# Patient Record
Sex: Female | Born: 1950
Health system: Southern US, Community
[De-identification: ages and names within clinical notes are randomized; demographics above are authoritative.]

## PROBLEM LIST (undated history)

## (undated) DIAGNOSIS — M1990 Unspecified osteoarthritis, unspecified site: Secondary | ICD-10-CM

## (undated) DIAGNOSIS — R634 Abnormal weight loss: Secondary | ICD-10-CM

## (undated) DIAGNOSIS — E785 Hyperlipidemia, unspecified: Secondary | ICD-10-CM

## (undated) DIAGNOSIS — M797 Fibromyalgia: Secondary | ICD-10-CM

## (undated) DIAGNOSIS — K219 Gastro-esophageal reflux disease without esophagitis: Secondary | ICD-10-CM

## (undated) DIAGNOSIS — G44029 Chronic cluster headache, not intractable: Secondary | ICD-10-CM

## (undated) DIAGNOSIS — IMO0001 Reserved for inherently not codable concepts without codable children: Secondary | ICD-10-CM

## (undated) DIAGNOSIS — G629 Polyneuropathy, unspecified: Secondary | ICD-10-CM

## (undated) DIAGNOSIS — I499 Cardiac arrhythmia, unspecified: Secondary | ICD-10-CM

## (undated) DIAGNOSIS — C801 Malignant (primary) neoplasm, unspecified: Secondary | ICD-10-CM

## (undated) DIAGNOSIS — R0600 Dyspnea, unspecified: Secondary | ICD-10-CM

## (undated) DIAGNOSIS — F32A Depression, unspecified: Secondary | ICD-10-CM

## (undated) DIAGNOSIS — F419 Anxiety disorder, unspecified: Secondary | ICD-10-CM

## (undated) DIAGNOSIS — I10 Essential (primary) hypertension: Secondary | ICD-10-CM

## (undated) DIAGNOSIS — Z8719 Personal history of other diseases of the digestive system: Secondary | ICD-10-CM

## (undated) DIAGNOSIS — F329 Major depressive disorder, single episode, unspecified: Secondary | ICD-10-CM

## (undated) DIAGNOSIS — Q8909 Congenital malformations of spleen: Secondary | ICD-10-CM

## (undated) DIAGNOSIS — G4730 Sleep apnea, unspecified: Secondary | ICD-10-CM

## (undated) HISTORY — DX: Hyperlipidemia, unspecified: E78.5

## (undated) HISTORY — PX: BACK SURGERY: SHX140

## (undated) HISTORY — PX: ABDOMINAL HYSTERECTOMY: SHX81

## (undated) HISTORY — DX: Reserved for inherently not codable concepts without codable children: IMO0001

## (undated) HISTORY — DX: Fibromyalgia: M79.7

## (undated) HISTORY — DX: Abnormal weight loss: R63.4

## (undated) HISTORY — PX: CHOLECYSTECTOMY: SHX55

## (undated) HISTORY — PX: BREAST BIOPSY: SHX20

## (undated) HISTORY — PX: NOSE SURGERY: SHX723

## (undated) HISTORY — PX: BREAST LUMPECTOMY: SHX2

## (undated) HISTORY — PX: ROTATOR CUFF REPAIR: SHX139

## (undated) HISTORY — PX: CARDIAC CATHETERIZATION: SHX172

## (undated) HISTORY — DX: Gastro-esophageal reflux disease without esophagitis: K21.9

## (undated) HISTORY — DX: Congenital malformations of spleen: Q89.09

---

## 1980-09-13 HISTORY — PX: BREAST EXCISIONAL BIOPSY: SUR124

## 1999-12-16 ENCOUNTER — Encounter: Admission: RE | Admit: 1999-12-16 | Discharge: 1999-12-16 | Payer: Self-pay | Admitting: Internal Medicine

## 1999-12-24 ENCOUNTER — Encounter: Admission: RE | Admit: 1999-12-24 | Discharge: 1999-12-24 | Payer: Self-pay | Admitting: Internal Medicine

## 2000-02-05 ENCOUNTER — Ambulatory Visit (HOSPITAL_COMMUNITY): Admission: RE | Admit: 2000-02-05 | Discharge: 2000-02-05 | Payer: Self-pay | Admitting: Hematology and Oncology

## 2000-02-05 ENCOUNTER — Encounter: Admission: RE | Admit: 2000-02-05 | Discharge: 2000-02-05 | Payer: Self-pay | Admitting: Hematology and Oncology

## 2000-02-09 ENCOUNTER — Ambulatory Visit (HOSPITAL_COMMUNITY): Admission: RE | Admit: 2000-02-09 | Discharge: 2000-02-09 | Payer: Self-pay | Admitting: Internal Medicine

## 2000-03-14 ENCOUNTER — Encounter: Admission: RE | Admit: 2000-03-14 | Discharge: 2000-03-14 | Payer: Self-pay

## 2000-05-05 ENCOUNTER — Encounter: Admission: RE | Admit: 2000-05-05 | Discharge: 2000-05-05 | Payer: Self-pay | Admitting: Hematology and Oncology

## 2000-05-08 ENCOUNTER — Ambulatory Visit (HOSPITAL_COMMUNITY): Admission: RE | Admit: 2000-05-08 | Discharge: 2000-05-08 | Payer: Self-pay | Admitting: Internal Medicine

## 2000-05-08 ENCOUNTER — Encounter: Payer: Self-pay | Admitting: Internal Medicine

## 2000-05-26 ENCOUNTER — Encounter: Admission: RE | Admit: 2000-05-26 | Discharge: 2000-05-26 | Payer: Self-pay | Admitting: Internal Medicine

## 2000-08-18 ENCOUNTER — Encounter: Admission: RE | Admit: 2000-08-18 | Discharge: 2000-08-18 | Payer: Self-pay | Admitting: Internal Medicine

## 2000-10-11 ENCOUNTER — Encounter: Admission: RE | Admit: 2000-10-11 | Discharge: 2000-10-11 | Payer: Self-pay | Admitting: Obstetrics & Gynecology

## 2000-11-11 ENCOUNTER — Encounter: Admission: RE | Admit: 2000-11-11 | Discharge: 2000-11-11 | Payer: Self-pay | Admitting: Internal Medicine

## 2000-11-14 ENCOUNTER — Encounter: Payer: Self-pay | Admitting: Hematology and Oncology

## 2000-11-14 ENCOUNTER — Ambulatory Visit (HOSPITAL_COMMUNITY): Admission: RE | Admit: 2000-11-14 | Discharge: 2000-11-14 | Payer: Self-pay | Admitting: Hematology and Oncology

## 2000-12-28 ENCOUNTER — Encounter: Admission: RE | Admit: 2000-12-28 | Discharge: 2000-12-28 | Payer: Self-pay | Admitting: Hematology and Oncology

## 2001-01-16 ENCOUNTER — Encounter: Admission: RE | Admit: 2001-01-16 | Discharge: 2001-01-16 | Payer: Self-pay | Admitting: Internal Medicine

## 2001-01-27 ENCOUNTER — Encounter: Admission: RE | Admit: 2001-01-27 | Discharge: 2001-01-27 | Payer: Self-pay | Admitting: Internal Medicine

## 2001-02-13 ENCOUNTER — Ambulatory Visit (HOSPITAL_COMMUNITY): Admission: RE | Admit: 2001-02-13 | Discharge: 2001-02-13 | Payer: Self-pay | Admitting: Internal Medicine

## 2001-02-13 ENCOUNTER — Encounter: Admission: RE | Admit: 2001-02-13 | Discharge: 2001-02-13 | Payer: Self-pay | Admitting: Internal Medicine

## 2001-02-16 ENCOUNTER — Encounter: Admission: RE | Admit: 2001-02-16 | Discharge: 2001-02-16 | Payer: Self-pay | Admitting: Internal Medicine

## 2001-02-21 ENCOUNTER — Encounter: Payer: Self-pay | Admitting: Internal Medicine

## 2001-02-21 ENCOUNTER — Ambulatory Visit (HOSPITAL_COMMUNITY): Admission: RE | Admit: 2001-02-21 | Discharge: 2001-02-21 | Payer: Self-pay | Admitting: Internal Medicine

## 2001-03-22 ENCOUNTER — Encounter: Admission: RE | Admit: 2001-03-22 | Discharge: 2001-03-22 | Payer: Self-pay | Admitting: Internal Medicine

## 2001-03-30 ENCOUNTER — Encounter: Admission: RE | Admit: 2001-03-30 | Discharge: 2001-03-30 | Payer: Self-pay | Admitting: Obstetrics

## 2001-03-30 ENCOUNTER — Emergency Department (HOSPITAL_COMMUNITY): Admission: EM | Admit: 2001-03-30 | Discharge: 2001-03-30 | Payer: Self-pay | Admitting: Emergency Medicine

## 2001-04-27 ENCOUNTER — Encounter: Admission: RE | Admit: 2001-04-27 | Discharge: 2001-04-27 | Payer: Self-pay | Admitting: Obstetrics

## 2001-05-12 ENCOUNTER — Encounter: Admission: RE | Admit: 2001-05-12 | Discharge: 2001-05-12 | Payer: Self-pay | Admitting: Internal Medicine

## 2001-05-14 ENCOUNTER — Ambulatory Visit (HOSPITAL_COMMUNITY): Admission: RE | Admit: 2001-05-14 | Discharge: 2001-05-14 | Payer: Self-pay | Admitting: Internal Medicine

## 2001-05-14 ENCOUNTER — Encounter: Payer: Self-pay | Admitting: Internal Medicine

## 2001-06-07 ENCOUNTER — Ambulatory Visit (HOSPITAL_COMMUNITY): Admission: RE | Admit: 2001-06-07 | Discharge: 2001-06-07 | Payer: Self-pay | Admitting: Internal Medicine

## 2001-06-07 ENCOUNTER — Encounter: Admission: RE | Admit: 2001-06-07 | Discharge: 2001-06-07 | Payer: Self-pay | Admitting: Internal Medicine

## 2001-06-12 ENCOUNTER — Encounter: Admission: RE | Admit: 2001-06-12 | Discharge: 2001-06-12 | Payer: Self-pay | Admitting: Internal Medicine

## 2001-09-13 HISTORY — PX: CARPAL TUNNEL RELEASE: SHX101

## 2008-11-29 ENCOUNTER — Ambulatory Visit (HOSPITAL_COMMUNITY): Admission: RE | Admit: 2008-11-29 | Discharge: 2008-11-29 | Payer: Self-pay | Admitting: Family Medicine

## 2008-12-02 ENCOUNTER — Ambulatory Visit (HOSPITAL_COMMUNITY): Admission: RE | Admit: 2008-12-02 | Discharge: 2008-12-02 | Payer: Self-pay | Admitting: Family Medicine

## 2009-08-25 ENCOUNTER — Ambulatory Visit (HOSPITAL_COMMUNITY): Admission: RE | Admit: 2009-08-25 | Discharge: 2009-08-25 | Payer: Self-pay | Admitting: Cardiovascular Disease

## 2010-02-10 ENCOUNTER — Ambulatory Visit (HOSPITAL_COMMUNITY): Admission: RE | Admit: 2010-02-10 | Discharge: 2010-02-10 | Payer: Self-pay | Admitting: Unknown Physician Specialty

## 2010-04-01 ENCOUNTER — Ambulatory Visit (HOSPITAL_COMMUNITY): Admission: RE | Admit: 2010-04-01 | Discharge: 2010-04-01 | Payer: Self-pay | Admitting: Internal Medicine

## 2010-04-16 ENCOUNTER — Ambulatory Visit: Payer: Self-pay | Admitting: Otolaryngology

## 2010-10-22 ENCOUNTER — Ambulatory Visit (INDEPENDENT_AMBULATORY_CARE_PROVIDER_SITE_OTHER): Payer: Self-pay | Admitting: Otolaryngology

## 2010-12-15 LAB — GLUCOSE, CAPILLARY
Glucose-Capillary: 107 mg/dL — ABNORMAL HIGH (ref 70–99)
Glucose-Capillary: 82 mg/dL (ref 70–99)

## 2011-02-18 ENCOUNTER — Other Ambulatory Visit (HOSPITAL_COMMUNITY): Payer: Self-pay | Admitting: Internal Medicine

## 2011-02-18 DIAGNOSIS — Z139 Encounter for screening, unspecified: Secondary | ICD-10-CM

## 2011-02-23 ENCOUNTER — Ambulatory Visit (HOSPITAL_COMMUNITY)
Admission: RE | Admit: 2011-02-23 | Discharge: 2011-02-23 | Disposition: A | Discharge status: Home / Self Care | Payer: Medicare Other | Source: Ambulatory Visit | Attending: Internal Medicine | Admitting: Internal Medicine

## 2011-02-23 DIAGNOSIS — Z1231 Encounter for screening mammogram for malignant neoplasm of breast: Secondary | ICD-10-CM | POA: Insufficient documentation

## 2011-02-23 DIAGNOSIS — Z139 Encounter for screening, unspecified: Secondary | ICD-10-CM

## 2011-02-25 DIAGNOSIS — R634 Abnormal weight loss: Secondary | ICD-10-CM

## 2011-02-25 HISTORY — DX: Abnormal weight loss: R63.4

## 2011-03-04 LAB — HEMOGLOBIN A1C: Hgb A1c MFr Bld: 5.9 % (ref 4.0–6.0)

## 2011-03-31 ENCOUNTER — Encounter (INDEPENDENT_AMBULATORY_CARE_PROVIDER_SITE_OTHER): Payer: Self-pay

## 2011-04-29 ENCOUNTER — Ambulatory Visit (INDEPENDENT_AMBULATORY_CARE_PROVIDER_SITE_OTHER): Payer: BC Managed Care – PPO | Admitting: Internal Medicine

## 2011-05-25 ENCOUNTER — Other Ambulatory Visit (HOSPITAL_COMMUNITY): Payer: Self-pay | Admitting: Internal Medicine

## 2011-05-25 DIAGNOSIS — M159 Polyosteoarthritis, unspecified: Secondary | ICD-10-CM

## 2011-05-28 ENCOUNTER — Ambulatory Visit (HOSPITAL_COMMUNITY)
Admission: RE | Admit: 2011-05-28 | Discharge: 2011-05-28 | Disposition: A | Discharge status: Home / Self Care | Payer: Medicare Other | Source: Ambulatory Visit | Attending: Internal Medicine | Admitting: Internal Medicine

## 2011-05-28 ENCOUNTER — Other Ambulatory Visit (HOSPITAL_COMMUNITY): Payer: Self-pay | Admitting: Internal Medicine

## 2011-05-28 DIAGNOSIS — M159 Polyosteoarthritis, unspecified: Secondary | ICD-10-CM | POA: Insufficient documentation

## 2011-05-31 ENCOUNTER — Encounter (HOSPITAL_COMMUNITY): Payer: Medicare Other

## 2011-06-01 ENCOUNTER — Encounter (HOSPITAL_COMMUNITY): Payer: Self-pay

## 2011-06-01 ENCOUNTER — Encounter (HOSPITAL_COMMUNITY)
Admission: RE | Admit: 2011-06-01 | Discharge: 2011-06-01 | Disposition: A | Discharge status: Home / Self Care | Payer: Medicare Other | Source: Ambulatory Visit | Attending: Internal Medicine | Admitting: Internal Medicine

## 2011-06-01 DIAGNOSIS — M25559 Pain in unspecified hip: Secondary | ICD-10-CM | POA: Insufficient documentation

## 2011-06-01 DIAGNOSIS — M545 Low back pain, unspecified: Secondary | ICD-10-CM | POA: Insufficient documentation

## 2011-06-01 DIAGNOSIS — R748 Abnormal levels of other serum enzymes: Secondary | ICD-10-CM | POA: Insufficient documentation

## 2011-06-01 HISTORY — DX: Essential (primary) hypertension: I10

## 2011-06-01 MED ORDER — TECHNETIUM TC 99M MEDRONATE IV KIT
25.0000 | PACK | Freq: Once | INTRAVENOUS | Status: AC | PRN
  Administered 2011-06-01: 24.6 via INTRAVENOUS

## 2011-10-06 ENCOUNTER — Other Ambulatory Visit (HOSPITAL_COMMUNITY): Payer: Self-pay | Admitting: Internal Medicine

## 2011-10-06 DIAGNOSIS — IMO0002 Reserved for concepts with insufficient information to code with codable children: Secondary | ICD-10-CM

## 2011-10-08 ENCOUNTER — Ambulatory Visit (HOSPITAL_COMMUNITY): Payer: Medicare Other

## 2011-10-12 ENCOUNTER — Ambulatory Visit (HOSPITAL_COMMUNITY)
Admission: RE | Admit: 2011-10-12 | Discharge: 2011-10-12 | Disposition: A | Discharge status: Home / Self Care | Payer: Medicare Other | Source: Ambulatory Visit | Attending: Internal Medicine | Admitting: Internal Medicine

## 2011-10-12 DIAGNOSIS — IMO0002 Reserved for concepts with insufficient information to code with codable children: Secondary | ICD-10-CM

## 2011-10-12 DIAGNOSIS — M5380 Other specified dorsopathies, site unspecified: Secondary | ICD-10-CM | POA: Insufficient documentation

## 2011-10-12 DIAGNOSIS — M5020 Other cervical disc displacement, unspecified cervical region: Secondary | ICD-10-CM | POA: Insufficient documentation

## 2011-10-12 DIAGNOSIS — M542 Cervicalgia: Secondary | ICD-10-CM | POA: Insufficient documentation

## 2011-10-12 DIAGNOSIS — M79609 Pain in unspecified limb: Secondary | ICD-10-CM | POA: Insufficient documentation

## 2012-04-05 ENCOUNTER — Other Ambulatory Visit (HOSPITAL_COMMUNITY): Payer: Self-pay | Admitting: Unknown Physician Specialty

## 2012-04-05 DIAGNOSIS — Z139 Encounter for screening, unspecified: Secondary | ICD-10-CM

## 2012-04-06 ENCOUNTER — Ambulatory Visit (HOSPITAL_COMMUNITY)
Admission: RE | Admit: 2012-04-06 | Discharge: 2012-04-06 | Disposition: A | Discharge status: Home / Self Care | Payer: Medicare Other | Source: Ambulatory Visit | Attending: Unknown Physician Specialty | Admitting: Unknown Physician Specialty

## 2012-04-06 DIAGNOSIS — Z1231 Encounter for screening mammogram for malignant neoplasm of breast: Secondary | ICD-10-CM | POA: Insufficient documentation

## 2012-04-06 DIAGNOSIS — Z139 Encounter for screening, unspecified: Secondary | ICD-10-CM

## 2012-05-18 ENCOUNTER — Encounter: Payer: Self-pay | Admitting: Gastroenterology

## 2012-05-18 ENCOUNTER — Ambulatory Visit (INDEPENDENT_AMBULATORY_CARE_PROVIDER_SITE_OTHER): Payer: Medicare Other | Admitting: Gastroenterology

## 2012-05-18 VITALS — BP 141/87 | HR 69 | Temp 97.5°F | Ht 65.0 in | Wt 173.4 lb

## 2012-05-18 DIAGNOSIS — Z800 Family history of malignant neoplasm of digestive organs: Secondary | ICD-10-CM

## 2012-05-18 DIAGNOSIS — R634 Abnormal weight loss: Secondary | ICD-10-CM

## 2012-05-18 DIAGNOSIS — K219 Gastro-esophageal reflux disease without esophagitis: Secondary | ICD-10-CM

## 2012-05-18 DIAGNOSIS — R109 Unspecified abdominal pain: Secondary | ICD-10-CM

## 2012-05-18 DIAGNOSIS — R1310 Dysphagia, unspecified: Secondary | ICD-10-CM

## 2012-05-18 MED ORDER — PEG-KCL-NACL-NASULF-NA ASC-C 100 G PO SOLR: 1.0000 | ORAL | Status: DC

## 2012-05-18 NOTE — Progress Notes (Signed)
Referring Provider: Fusco, Lawrence J., MD Primary Care Physician:  FUSCO,LAWRENCE J., MD Primary Gastroenterologist:  Dr. Rourk  Chief Complaint  Patient presents with  . Colonoscopy    HPI:   61-year-old female presenting today for colonoscopy due to +FH of colon cancer in first-degree relative (father, unclear age of diagnosis). 2 maternal aunts, maternal uncle with colon cancer.  Mother died at age 40 of uterine/ovarian cancer. Pt was 61 years old. Last colonoscopy 2007 at [facility] per pt; however, they have no record of this. Pt does not believe she had polyps. Also notes EGD around that time with some type of growth in her stomach.   Notes chronic abdominal pain. States LUQ pain after eating. RLQ pain intermittent without aggravating factors. HOWEVER, sometimes relieved after bout of diarrhea. Notes since chole +diarrhea. Denies N/V. Still has ovaries, sees GYN end of month. Denies rectal bleeding.   +wt loss of almost 20 lbs over past 6 months, unintentional. +early satiety and bloating. Appetite waxes and wanes. +esophageal dysphagia. Nexium BID, in donut hole. Unable to get meds.   Tried Prilosec, Prevacid, Protonix. +nocturnal reflux, sometimes some drained out of mouth in middle of night.   Past Medical History  Diagnosis Date  . GERD (gastroesophageal reflux disease)   . Weight loss 02/25/2011  . Diabetes mellitus   . Hypertension   . Asthma   . Fibromyalgia     Past Surgical History  Procedure Date  . Abdominal hysterectomy     ovaries remain, done because of endometriosis  . Back surgery     lower back  . Rotator cuff repair     left  . Cholecystectomy   . Nose surgery   . Breast lumpectomy     both breast    Current Outpatient Prescriptions  Medication Sig Dispense Refill  . ALPRAZolam (XANAX) 1 MG tablet Take 1 mg by mouth.        . carisoprodol (SOMA) 350 MG tablet Take 350 mg by mouth 3 (three) times daily as needed.      . cyclobenzaprine  (FLEXERIL) 10 MG tablet Take 10 mg by mouth 3 (three) times daily as needed.      . enalapril (VASOTEC) 2.5 MG tablet Take 2.5 mg by mouth daily.        . etodolac (LODINE) 500 MG tablet Take 500 mg by mouth 2 (two) times daily.      . FLUoxetine (PROZAC) 20 MG capsule Take 40 mg by mouth daily.       . gabapentin (NEURONTIN) 600 MG tablet Take 600 mg by mouth 3 (three) times daily.        . metoprolol (TOPROL-XL) 50 MG 24 hr tablet Take 50 mg by mouth 2 (two) times daily.       . traMADol (ULTRAM) 50 MG tablet Take 50 mg by mouth 3 (three) times daily. 2 pills x 3 days if needed      . albuterol (PROVENTIL) (2.5 MG/3ML) 0.083% nebulizer solution Take 2.5 mg by nebulization.        . aspirin 81 MG tablet Take 81 mg by mouth daily.        . ciclesonide (ALVESCO) 160 MCG/ACT inhaler Inhale 1 puff into the lungs 2 (two) times daily.        . enalapril (VASOTEC) 5 MG tablet Take 5 mg by mouth daily.        . esomeprazole (NEXIUM) 40 MG capsule Take 40 mg by mouth daily before breakfast.       .   mometasone (NASONEX) 50 MCG/ACT nasal spray Place 2 sprays into the nose daily.        . peg 3350 powder (MOVIPREP) 100 G SOLR Take 1 kit (100 g total) by mouth as directed.  1 kit  0    Allergies as of 05/18/2012 - Review Complete 05/18/2012  Allergen Reaction Noted  . Clarithromycin  03/31/2011  . Codeine Itching 03/31/2011  . Darvocet (propoxyphene-acetaminophen) Itching 03/31/2011  . Sulfa antibiotics Hives 03/31/2011  . Tape Itching 06/01/2011    Family History  Problem Relation Age of Onset  . Colon cancer Father   . Colon cancer Maternal Aunt   . Colon cancer Maternal Uncle     History   Social History  . Marital Status: Divorced    Spouse Name: N/A    Number of Children: N/A  . Years of Education: N/A   Occupational History  . Not on file.   Social History Main Topics  . Smoking status: Former Smoker -- 0.5 packs/day    Types: Cigarettes  . Smokeless tobacco: Not on file    Comment: quit about 25 + years ago  . Alcohol Use: No  . Drug Use: No  . Sexually Active: Not on file   Other Topics Concern  . Not on file   Social History Narrative  . No narrative on file    Review of Systems: Gen: SEE HPI CV: Denies chest pain, heart palpitations, syncope, peripheral edema. Resp: Denies shortness of breath with rest, cough, wheezing GI: SEE HPI GU : Denies urinary burning, urinary frequency, urinary incontinence.  MS: Denies joint pain, muscle weakness, cramps, limited movement Derm: Denies rash, itching, dry skin Psych: Denies depression, anxiety, confusion or memory loss  Heme: Denies bruising, bleeding, and enlarged lymph nodes.  Physical Exam: BP 141/87  Pulse 69  Temp 97.5 F (36.4 C) (Temporal)  Ht 5' 5" (1.651 m)  Wt 173 lb 6.4 oz (78.654 kg)  BMI 28.86 kg/m2 General:   Alert and oriented. Well-developed, well-nourished, pleasant and cooperative. Head:  Normocephalic and atraumatic. Eyes:  Conjunctiva pink, sclera clear, no icterus.   Conjunctiva pink. Ears:  Normal auditory acuity. Nose:  No deformity, discharge,  or lesions. Mouth:  No deformity or lesions, mucosa pink and moist.  Neck:  Supple, without mass or thyromegaly. Lungs:  Clear to auscultation bilaterally, without wheezing, rales, or rhonchi.  Heart:  S1, S2 present without murmurs noted.  Abdomen:  +BS, soft, TTP epigastric, LUQ, RLQ and non-distended. Without mass or HSM. No rebound or guarding. No hernias noted. Rectal:  Deferred  Msk:  Symmetrical without gross deformities. Normal posture. Extremities:  Without clubbing or edema. Neurologic:  Alert and  oriented x4;  grossly normal neurologically. Skin:  Intact, warm and dry without significant lesions or rashes Cervical Nodes:  No significant cervical adenopathy. Psych:  Alert and cooperative. Normal mood and affect.   

## 2012-05-18 NOTE — Patient Instructions (Addendum)
We have set you up for a colonoscopy and upper endoscopy with Dr. Darrick Penna in the near future.  Further recommendations to follow once this is completed.

## 2012-05-23 DIAGNOSIS — R634 Abnormal weight loss: Secondary | ICD-10-CM | POA: Insufficient documentation

## 2012-05-23 DIAGNOSIS — R109 Unspecified abdominal pain: Secondary | ICD-10-CM | POA: Insufficient documentation

## 2012-05-23 DIAGNOSIS — R1310 Dysphagia, unspecified: Secondary | ICD-10-CM | POA: Insufficient documentation

## 2012-05-23 DIAGNOSIS — Z800 Family history of malignant neoplasm of digestive organs: Secondary | ICD-10-CM | POA: Insufficient documentation

## 2012-05-23 DIAGNOSIS — K219 Gastro-esophageal reflux disease without esophagitis: Secondary | ICD-10-CM | POA: Insufficient documentation

## 2012-05-23 NOTE — Assessment & Plan Note (Signed)
Unintentional, early satiety. TCS and EGD as planned.

## 2012-05-23 NOTE — Assessment & Plan Note (Signed)
Severe. Tried/failed multiple PPIs in past. Continue Nexium, consider BID dosing. Needs wt loss, dietary efforts. EGD as planned.

## 2012-05-23 NOTE — Assessment & Plan Note (Signed)
LUQ pain after eating, chronic, +early satiety. Reports possible hx of some type of growth in stomach. Unable to obtain EGD reports from [facility], as medical records state none exist. Does report remote hx of EGD, around 2007 at time of last TCS. No melena noted. Needs EGD due to dyspepsia, unintentional wt loss. Small bowel biopsy at time of EGD due to bloating, pain, loose stools, r/o celiac. Also notes esophageal dysphagia. Question r/t uncontrolled reflux and/or Schatzki's ring, web, doubt stricture.  Proceed with upper endoscopy, dilation, small bowel biopsy in the near future with Dr. Jena Gauss. The risks, benefits, and alternatives have been discussed in detail with patient. They have stated understanding and desire to proceed.  Continue Nexium daily (has tried/failed multiple PPIs in past, see HPI)

## 2012-05-23 NOTE — Assessment & Plan Note (Signed)
Dilation scheduled.

## 2012-05-23 NOTE — Progress Notes (Signed)
Faxed to PCP

## 2012-05-23 NOTE — Assessment & Plan Note (Signed)
61 year old female with +FH colon cancer in first-degree relative (father, age unknown at onset). Last TCS reportedly in 2007 in [place]; however, no records exist for this despite our request. No rectal bleeding noted. +RLQ pain, sometimes relieved after BM, notes intermittent chronic loose stools following cholecystectomy. +bloating. Sees GYN end of month (?RLQ with IBS component vs GYN issue? Pt notes feels like hx of ovarian cyst at times)  Proceed with TCS with Dr. Jena Gauss in near future: the risks, benefits, and alternatives have been discussed with the patient in detail. The patient states understanding and desires to proceed.

## 2012-05-25 ENCOUNTER — Ambulatory Visit (INDEPENDENT_AMBULATORY_CARE_PROVIDER_SITE_OTHER): Payer: Medicare Other | Admitting: Otolaryngology

## 2012-05-25 DIAGNOSIS — J343 Hypertrophy of nasal turbinates: Secondary | ICD-10-CM

## 2012-05-25 DIAGNOSIS — J310 Chronic rhinitis: Secondary | ICD-10-CM

## 2012-05-25 DIAGNOSIS — R070 Pain in throat: Secondary | ICD-10-CM

## 2012-06-07 ENCOUNTER — Encounter (HOSPITAL_COMMUNITY): Payer: Self-pay | Admitting: Pharmacy Technician

## 2012-06-13 HISTORY — PX: COLONOSCOPY: SHX174

## 2012-06-14 ENCOUNTER — Encounter (HOSPITAL_COMMUNITY): Payer: Self-pay | Admitting: *Deleted

## 2012-06-14 ENCOUNTER — Ambulatory Visit (HOSPITAL_COMMUNITY)
Admission: RE | Admit: 2012-06-14 | Discharge: 2012-06-14 | Disposition: A | Discharge status: Home / Self Care | Payer: Medicare Other | Source: Ambulatory Visit | Attending: Internal Medicine | Admitting: Internal Medicine

## 2012-06-14 ENCOUNTER — Encounter (HOSPITAL_COMMUNITY)
Admission: RE | Disposition: A | Discharge status: Home / Self Care | Payer: Self-pay | Source: Ambulatory Visit | Attending: Internal Medicine

## 2012-06-14 DIAGNOSIS — Z1211 Encounter for screening for malignant neoplasm of colon: Secondary | ICD-10-CM

## 2012-06-14 DIAGNOSIS — R109 Unspecified abdominal pain: Secondary | ICD-10-CM

## 2012-06-14 DIAGNOSIS — K449 Diaphragmatic hernia without obstruction or gangrene: Secondary | ICD-10-CM | POA: Insufficient documentation

## 2012-06-14 DIAGNOSIS — E119 Type 2 diabetes mellitus without complications: Secondary | ICD-10-CM | POA: Insufficient documentation

## 2012-06-14 DIAGNOSIS — R933 Abnormal findings on diagnostic imaging of other parts of digestive tract: Secondary | ICD-10-CM

## 2012-06-14 DIAGNOSIS — I10 Essential (primary) hypertension: Secondary | ICD-10-CM | POA: Insufficient documentation

## 2012-06-14 DIAGNOSIS — Z01812 Encounter for preprocedural laboratory examination: Secondary | ICD-10-CM | POA: Insufficient documentation

## 2012-06-14 DIAGNOSIS — Z800 Family history of malignant neoplasm of digestive organs: Secondary | ICD-10-CM

## 2012-06-14 DIAGNOSIS — R634 Abnormal weight loss: Secondary | ICD-10-CM

## 2012-06-14 DIAGNOSIS — R1310 Dysphagia, unspecified: Secondary | ICD-10-CM | POA: Insufficient documentation

## 2012-06-14 DIAGNOSIS — K219 Gastro-esophageal reflux disease without esophagitis: Secondary | ICD-10-CM

## 2012-06-14 HISTORY — PX: ESOPHAGEAL DILATION: SHX303

## 2012-06-14 LAB — GLUCOSE, CAPILLARY: Glucose-Capillary: 70 mg/dL (ref 70–99)

## 2012-06-14 SURGERY — COLONOSCOPY WITH ESOPHAGOGASTRODUODENOSCOPY (EGD)
Anesthesia: Moderate Sedation | Site: Mouth

## 2012-06-14 MED ORDER — MIDAZOLAM HCL 5 MG/5ML IJ SOLN
INTRAMUSCULAR | Status: AC
  Filled 2012-06-14: qty 10

## 2012-06-14 MED ORDER — SODIUM CHLORIDE 0.45 % IV SOLN
INTRAVENOUS | Status: DC
  Administered 2012-06-14: 1000 mL via INTRAVENOUS

## 2012-06-14 MED ORDER — MEPERIDINE HCL 100 MG/ML IJ SOLN
INTRAMUSCULAR | Status: AC
  Filled 2012-06-14: qty 2

## 2012-06-14 MED ORDER — MEPERIDINE HCL 100 MG/ML IJ SOLN
INTRAMUSCULAR | Status: DC | PRN
  Administered 2012-06-14 (×2): 25 mg via INTRAVENOUS
  Administered 2012-06-14 (×2): 50 mg via INTRAVENOUS

## 2012-06-14 MED ORDER — MIDAZOLAM HCL 5 MG/5ML IJ SOLN
INTRAMUSCULAR | Status: DC | PRN
  Administered 2012-06-14: 1 mg via INTRAVENOUS
  Administered 2012-06-14: 2 mg via INTRAVENOUS
  Administered 2012-06-14: 1 mg via INTRAVENOUS
  Administered 2012-06-14: 2 mg via INTRAVENOUS

## 2012-06-14 MED ORDER — STERILE WATER FOR IRRIGATION IR SOLN
Status: DC | PRN
  Administered 2012-06-14: 11:00:00

## 2012-06-14 MED ORDER — BUTAMBEN-TETRACAINE-BENZOCAINE 2-2-14 % EX AERO
INHALATION_SPRAY | CUTANEOUS | Status: DC | PRN
  Administered 2012-06-14: 2 via TOPICAL

## 2012-06-14 NOTE — Op Note (Signed)
[facility] [address] [place], [address]   ENDOSCOPY PROCEDURE REPORT  PATIENT: Pamela, Hatfield  MR#: [id number] BIRTHDATE: [birth year] , 61  yrs. old GENDER: Female ENDOSCOPIST: R.  Roetta Sessions, MD FACP FACG REFERRED BY:  [name], M.D. PROCEDURE DATE:  06/14/2012 PROCEDURE:     EGD with Elease Hashimoto dilation followed by gastric biopsy  INDICATIONS:     Refractory GERD; esophageal dysphagia.  Sketchy history of a gastric "mass" on an EGD in [place] back in 2007  INFORMED CONSENT:   The risks, benefits, limitations, alternatives and imponderables have been discussed.  The potential for biopsy, esophogeal dilation, etc. have also been reviewed.  Questions have been answered.  All parties agreeable.  Please see the history and physical in the medical record for more information.  MEDICATIONS:    Versed 4 mg IV and Demerol 100 mg IV in divided doses.  DESCRIPTION OF PROCEDURE:   The Pentax Gastroscope [id number] endoscope was introduced through the mouth and advanced to the second portion of the duodenum without difficulty or limitations. The mucosal surfaces were surveyed very carefully during advancement of the scope and upon withdrawal.  Retroflexion view of the proximal stomach and esophagogastric junction was performed.      FINDINGS: Normal, patent appearing tubular esophagus. In fact, the EG junction appeared patulous. Stomach empty. Small hiatal hernia. 8-9 mm extrinsic appearing compression versus submucosal mass in the inferior aspect of the antrum. Please see above photos. There was patchy erythema of the gastric antrum and body of uncertain significance. No ulcer or infiltrating process pylorus patent. Examination of the bulb and second portion revealed no abnormalities.  THERAPEUTIC / DIAGNOSTIC MANEUVERS PERFORMED:  A 56 French Maloney dilator was passed to full insertion easily. A look back revealed a superficial tear through the UES mucosa.  Subsequently, biopsies of gastric antrum and body were taken for histologic study.   COMPLICATIONS:  None  IMPRESSION:  Normal-appearing esophagus endoscopically-status post passage of a Maloney dilator. Small hiatal hernia. Abnormal gastric mucosa as described above-status post biopsy. Submucosal mass versus extrinsic mass effect. Will consider endoscopic ultrasound to further evaluate pending review of pathology report. See colonoscopy report.  RECOMMENDATIONS:    _______________________________ R. Roetta Sessions, MD FACP [facility] eSigned:  R. Roetta Sessions, MD FACP [facility] 06/14/2012 11:55 AM     CC:  PATIENT NAME:  Pamela Hatfield, Pamela Hatfield MR#: [id number]

## 2012-06-14 NOTE — Interval H&P Note (Signed)
History and Physical Interval Note:  06/14/2012 11:14 AM  Pamela Hatfield  has presented today for surgery, with the diagnosis of Family Hx of colon cancer, abdominal pain, weight loss  The various methods of treatment have been discussed with the patient and family. After consideration of risks, benefits and other options for treatment, the patient has consented to  Procedure(s) (LRB) with comments: COLONOSCOPY WITH ESOPHAGOGASTRODUODENOSCOPY (EGD) (N/A) - 10:50 as a surgical intervention .  The patient's history has been reviewed, patient examined, no change in status, stable for surgery.  I have reviewed the patient's chart and labs.  Questions were answered to the patient's satisfaction.     Eula Listen

## 2012-06-14 NOTE — H&P (View-Only) (Signed)
Referring Provider: Cassell Smiles., MD Primary Care Physician:  Cassell Smiles., MD Primary Gastroenterologist:  Dr. Jena Gauss  Chief Complaint  Patient presents with  . Colonoscopy    HPI:   61 year old female presenting today for colonoscopy due to +FH of colon cancer in first-degree relative (father, unclear age of diagnosis). 2 maternal aunts, maternal uncle with colon cancer.  Mother died at age 88 of uterine/ovarian cancer. Pt was 61 years old. Last colonoscopy 2007 at [facility] per pt; however, they have no record of this. Pt does not believe she had polyps. Also notes EGD around that time with some type of growth in her stomach.   Notes chronic abdominal pain. States LUQ pain after eating. RLQ pain intermittent without aggravating factors. HOWEVER, sometimes relieved after bout of diarrhea. Notes since chole +diarrhea. Denies N/V. Still has ovaries, sees GYN end of month. Denies rectal bleeding.   +wt loss of almost 20 lbs over past 6 months, unintentional. +early satiety and bloating. Appetite waxes and wanes. +esophageal dysphagia. Nexium BID, in donut hole. Unable to get meds.   Tried Prilosec, Prevacid, Protonix. +nocturnal reflux, sometimes some drained out of mouth in middle of night.   Past Medical History  Diagnosis Date  . GERD (gastroesophageal reflux disease)   . Weight loss 02/25/2011  . Diabetes mellitus   . Hypertension   . Asthma   . Fibromyalgia     Past Surgical History  Procedure Date  . Abdominal hysterectomy     ovaries remain, done because of endometriosis  . Back surgery     lower back  . Rotator cuff repair     left  . Cholecystectomy   . Nose surgery   . Breast lumpectomy     both breast    Current Outpatient Prescriptions  Medication Sig Dispense Refill  . ALPRAZolam (XANAX) 1 MG tablet Take 1 mg by mouth.        . carisoprodol (SOMA) 350 MG tablet Take 350 mg by mouth 3 (three) times daily as needed.      . cyclobenzaprine  (FLEXERIL) 10 MG tablet Take 10 mg by mouth 3 (three) times daily as needed.      . enalapril (VASOTEC) 2.5 MG tablet Take 2.5 mg by mouth daily.        Marland Kitchen etodolac (LODINE) 500 MG tablet Take 500 mg by mouth 2 (two) times daily.      Marland Kitchen FLUoxetine (PROZAC) 20 MG capsule Take 40 mg by mouth daily.       Marland Kitchen gabapentin (NEURONTIN) 600 MG tablet Take 600 mg by mouth 3 (three) times daily.        . metoprolol (TOPROL-XL) 50 MG 24 hr tablet Take 50 mg by mouth 2 (two) times daily.       . traMADol (ULTRAM) 50 MG tablet Take 50 mg by mouth 3 (three) times daily. 2 pills x 3 days if needed      . albuterol (PROVENTIL) (2.5 MG/3ML) 0.083% nebulizer solution Take 2.5 mg by nebulization.        Marland Kitchen aspirin 81 MG tablet Take 81 mg by mouth daily.        . ciclesonide (ALVESCO) 160 MCG/ACT inhaler Inhale 1 puff into the lungs 2 (two) times daily.        . enalapril (VASOTEC) 5 MG tablet Take 5 mg by mouth daily.        Marland Kitchen esomeprazole (NEXIUM) 40 MG capsule Take 40 mg by mouth daily before breakfast.       .  mometasone (NASONEX) 50 MCG/ACT nasal spray Place 2 sprays into the nose daily.        . peg 3350 powder (MOVIPREP) 100 G SOLR Take 1 kit (100 g total) by mouth as directed.  1 kit  0    Allergies as of 05/18/2012 - Review Complete 05/18/2012  Allergen Reaction Noted  . Clarithromycin  03/31/2011  . Codeine Itching 03/31/2011  . Darvocet (propoxyphene-acetaminophen) Itching 03/31/2011  . Sulfa antibiotics Hives 03/31/2011  . Tape Itching 06/01/2011    Family History  Problem Relation Age of Onset  . Colon cancer Father   . Colon cancer Maternal Aunt   . Colon cancer Maternal Uncle     History   Social History  . Marital Status: Divorced    Spouse Name: N/A    Number of Children: N/A  . Years of Education: N/A   Occupational History  . Not on file.   Social History Main Topics  . Smoking status: Former Smoker -- 0.5 packs/day    Types: Cigarettes  . Smokeless tobacco: Not on file    Comment: quit about 25 + years ago  . Alcohol Use: No  . Drug Use: No  . Sexually Active: Not on file   Other Topics Concern  . Not on file   Social History Narrative  . No narrative on file    Review of Systems: Gen: SEE HPI CV: Denies chest pain, heart palpitations, syncope, peripheral edema. Resp: Denies shortness of breath with rest, cough, wheezing GI: SEE HPI GU : Denies urinary burning, urinary frequency, urinary incontinence.  MS: Denies joint pain, muscle weakness, cramps, limited movement Derm: Denies rash, itching, dry skin Psych: Denies depression, anxiety, confusion or memory loss  Heme: Denies bruising, bleeding, and enlarged lymph nodes.  Physical Exam: BP 141/87  Pulse 69  Temp 97.5 F (36.4 C) (Temporal)  Ht 5\' 5"  (1.651 m)  Wt 173 lb 6.4 oz (78.654 kg)  BMI 28.86 kg/m2 General:   Alert and oriented. Well-developed, well-nourished, pleasant and cooperative. Head:  Normocephalic and atraumatic. Eyes:  Conjunctiva pink, sclera clear, no icterus.   Conjunctiva pink. Ears:  Normal auditory acuity. Nose:  No deformity, discharge,  or lesions. Mouth:  No deformity or lesions, mucosa pink and moist.  Neck:  Supple, without mass or thyromegaly. Lungs:  Clear to auscultation bilaterally, without wheezing, rales, or rhonchi.  Heart:  S1, S2 present without murmurs noted.  Abdomen:  +BS, soft, TTP epigastric, LUQ, RLQ and non-distended. Without mass or HSM. No rebound or guarding. No hernias noted. Rectal:  Deferred  Msk:  Symmetrical without gross deformities. Normal posture. Extremities:  Without clubbing or edema. Neurologic:  Alert and  oriented x4;  grossly normal neurologically. Skin:  Intact, warm and dry without significant lesions or rashes Cervical Nodes:  No significant cervical adenopathy. Psych:  Alert and cooperative. Normal mood and affect.

## 2012-06-14 NOTE — Op Note (Signed)
[facility] [address] [place], [address]   COLONOSCOPY PROCEDURE REPORT  PATIENT: Pamela Hatfield, Pamela Hatfield  MR#:         [id number] BIRTHDATE: 12-08-1950 , 61  yrs. old GENDER: Female ENDOSCOPIST: R.  Roetta Sessions, MD FACP FACG REFERRED BY:  [name], M.D. PROCEDURE DATE:  06/14/2012 PROCEDURE:     high-risk screening colonoscopy  INDICATIONS: positive family history colon cancer  INFORMED CONSENT:  The risks, benefits, alternatives and imponderables including but not limited to bleeding, perforation as well as the possibility of a missed lesion have been reviewed.  The potential for biopsy, lesion removal, etc. have also been discussed.  Questions have been answered.  All parties agreeable. Please see the history and physical in the medical record for more information.  MEDICATIONS: Versed 6 mg IV and Demerol 150 mg IV in divided doses.  DESCRIPTION OF PROCEDURE:  After a digital rectal exam was performed, the EC-[id number] ([id number])  colonoscope was advanced from the anus through the rectum and colon to the area of the cecum, ileocecal valve and appendiceal orifice.  The cecum was deeply intubated.  These structures were well-seen and photographed for the record.  From the level of the cecum and ileocecal valve, the scope was slowly and cautiously withdrawn.  The mucosal surfaces were carefully surveyed utilizing scope tip deflection to facilitate fold flattening as needed.  The scope was pulled down into the rectum where a thorough examination was performed.    FINDINGS:  Adequate preparation. Rectal mucosa appeared normal. Rectal vault was small unable to retroflex but seen well on-face. normal-appearing colonic mucosa.  THERAPEUTIC / DIAGNOSTIC MANEUVERS PERFORMED:  none  COMPLICATIONS: none  CECAL WITHDRAWAL TIME:  7 minutes  IMPRESSION:  normal rectum and colon  RECOMMENDATIONS: Repeat high-risk screening colonoscopy in 5  years.   _______________________________ eSigned:  R. Roetta Sessions, MD FACP [facility] 06/14/2012 12:19 PM   CC:

## 2012-06-17 ENCOUNTER — Encounter: Payer: Self-pay | Admitting: Internal Medicine

## 2012-06-19 ENCOUNTER — Encounter (HOSPITAL_COMMUNITY): Payer: Self-pay | Admitting: Internal Medicine

## 2012-06-21 ENCOUNTER — Telehealth: Payer: Self-pay

## 2012-06-21 DIAGNOSIS — K319 Disease of stomach and duodenum, unspecified: Secondary | ICD-10-CM

## 2012-06-21 NOTE — Telephone Encounter (Signed)
Ok.  She needs upper eus, radial +/- linear, ++ propofol, next available EUS Thursday for gastric submucosal lesion.         Thanks              ----- Message -----       From: Donata Duff, CMA       Sent: 06/21/2012   1:07 PM         To: Rachael Fee, MD    Subject: Annell Greening: Results Review                                              ----- Message -----       From: Glendora Score       Sent: 06/21/2012  11:23 AM         To: Donata Duff, CMA    Subject: FW: Results Review                                    Patient needs EUS with Dr. Christella Hartigan per path report abnormal area in her stomach for which an endoscopic ultrasound will be scheduled. Thanks!!              ----- Message -----       From: Evalee Mutton, LPN       Sent: 06/21/2012  11:14 AM         To: Lanelle Bal    Subject: FW: Results Review                                    Dawn, please send letter, Benedetto Goad, please schedule EUS    ----- Message -----       From: Corbin Ade, MD       Sent: 06/17/2012   6:56 PM         To: Janece Canterbury, LPN    Subject: Results Review                                                              CHRISELDA LEPPERT   06/17/2012 6:52 PM Letter (Out)  MRN: [id number]   Description: 61 year old female  Provider: Eula Listen, MD  Department: [facility]       Clinical Letter Summary       Letters     Letter Information         Status    Corbin Ade on 06/17/2012 Sent           Patient Demographics       Address Phone    [address] [phone] [facility])    [place] [address] [phone] (Mobile)

## 2012-06-21 NOTE — Progress Notes (Signed)
Send letter to patient.  Send copy of letter with path to referring provider and PCP.Raynelle Fanning, pt needs EUS by Christella Hartigan scheduled

## 2012-06-21 NOTE — Progress Notes (Signed)
LW has sent info to Dr. Christella Hartigan and pt is aware that they will be calling her. Dawn has mailed letter to pt and pcp.

## 2012-06-22 ENCOUNTER — Other Ambulatory Visit: Payer: Self-pay

## 2012-06-22 ENCOUNTER — Encounter: Payer: Self-pay | Admitting: *Deleted

## 2012-06-22 ENCOUNTER — Ambulatory Visit (INDEPENDENT_AMBULATORY_CARE_PROVIDER_SITE_OTHER): Payer: Medicare Other | Admitting: Otolaryngology

## 2012-06-22 DIAGNOSIS — J0100 Acute maxillary sinusitis, unspecified: Secondary | ICD-10-CM

## 2012-06-22 DIAGNOSIS — K319 Disease of stomach and duodenum, unspecified: Secondary | ICD-10-CM

## 2012-06-22 DIAGNOSIS — R070 Pain in throat: Secondary | ICD-10-CM

## 2012-06-22 DIAGNOSIS — K219 Gastro-esophageal reflux disease without esophagitis: Secondary | ICD-10-CM

## 2012-06-22 NOTE — Telephone Encounter (Signed)
Pt has been instructed and meds reviewed she will call with any questions or concerns after reviewing the information received

## 2012-07-13 ENCOUNTER — Encounter (HOSPITAL_COMMUNITY): Payer: Self-pay | Admitting: Anesthesiology

## 2012-07-13 ENCOUNTER — Ambulatory Visit (HOSPITAL_COMMUNITY): Payer: Medicare Other | Admitting: Anesthesiology

## 2012-07-13 ENCOUNTER — Ambulatory Visit (HOSPITAL_COMMUNITY)
Admission: RE | Admit: 2012-07-13 | Discharge: 2012-07-13 | Disposition: A | Discharge status: Home / Self Care | Payer: Medicare Other | Source: Ambulatory Visit | Attending: Gastroenterology | Admitting: Gastroenterology

## 2012-07-13 ENCOUNTER — Encounter (HOSPITAL_COMMUNITY): Payer: Self-pay | Admitting: *Deleted

## 2012-07-13 ENCOUNTER — Encounter (HOSPITAL_COMMUNITY)
Admission: RE | Disposition: A | Discharge status: Home / Self Care | Payer: Self-pay | Source: Ambulatory Visit | Attending: Gastroenterology

## 2012-07-13 DIAGNOSIS — J45909 Unspecified asthma, uncomplicated: Secondary | ICD-10-CM | POA: Insufficient documentation

## 2012-07-13 DIAGNOSIS — R1310 Dysphagia, unspecified: Secondary | ICD-10-CM | POA: Insufficient documentation

## 2012-07-13 DIAGNOSIS — K219 Gastro-esophageal reflux disease without esophagitis: Secondary | ICD-10-CM | POA: Insufficient documentation

## 2012-07-13 DIAGNOSIS — I10 Essential (primary) hypertension: Secondary | ICD-10-CM | POA: Insufficient documentation

## 2012-07-13 DIAGNOSIS — E119 Type 2 diabetes mellitus without complications: Secondary | ICD-10-CM | POA: Insufficient documentation

## 2012-07-13 DIAGNOSIS — K319 Disease of stomach and duodenum, unspecified: Secondary | ICD-10-CM

## 2012-07-13 DIAGNOSIS — R933 Abnormal findings on diagnostic imaging of other parts of digestive tract: Secondary | ICD-10-CM

## 2012-07-13 HISTORY — PX: EUS: SHX5427

## 2012-07-13 LAB — GLUCOSE, CAPILLARY: Glucose-Capillary: 91 mg/dL (ref 70–99)

## 2012-07-13 SURGERY — UPPER ENDOSCOPIC ULTRASOUND (EUS) LINEAR
Anesthesia: Monitor Anesthesia Care

## 2012-07-13 MED ORDER — LACTATED RINGERS IV SOLN
INTRAVENOUS | Status: DC
  Administered 2012-07-13: 08:00:00 via INTRAVENOUS

## 2012-07-13 MED ORDER — BUTAMBEN-TETRACAINE-BENZOCAINE 2-2-14 % EX AERO
INHALATION_SPRAY | CUTANEOUS | Status: DC | PRN
  Administered 2012-07-13: 2 via TOPICAL

## 2012-07-13 MED ORDER — FENTANYL CITRATE 0.05 MG/ML IJ SOLN: 25.0000 ug | INTRAMUSCULAR | Status: DC | PRN

## 2012-07-13 MED ORDER — PROPOFOL 10 MG/ML IV EMUL
INTRAVENOUS | Status: DC | PRN
  Administered 2012-07-13: 75 ug/kg/min via INTRAVENOUS

## 2012-07-13 MED ORDER — MIDAZOLAM HCL 5 MG/5ML IJ SOLN
INTRAMUSCULAR | Status: DC | PRN
  Administered 2012-07-13: 2 mg via INTRAVENOUS

## 2012-07-13 MED ORDER — FENTANYL CITRATE 0.05 MG/ML IJ SOLN
INTRAMUSCULAR | Status: DC | PRN
  Administered 2012-07-13 (×2): 25 ug via INTRAVENOUS

## 2012-07-13 MED ORDER — SODIUM CHLORIDE 0.9 % IV SOLN: INTRAVENOUS | Status: DC

## 2012-07-13 MED ORDER — LIDOCAINE HCL (CARDIAC) 20 MG/ML IV SOLN
INTRAVENOUS | Status: DC | PRN
  Administered 2012-07-13: 50 mg via INTRAVENOUS

## 2012-07-13 NOTE — Anesthesia Preprocedure Evaluation (Addendum)
Anesthesia Evaluation  Patient identified by MRN, date of birth, ID band Patient awake    Reviewed: Allergy & Precautions, H&P , NPO status , Patient's Chart, lab work & pertinent test results, reviewed documented beta blocker date and time   Airway Mallampati: II TM Distance: >3 FB Neck ROM: full    Dental No notable dental hx. (+) Teeth Intact and Dental Advisory Given   Pulmonary asthma ,  Mild exercise induced asthma. breath sounds clear to auscultation  Pulmonary exam normal       Cardiovascular Exercise Tolerance: Good hypertension, Pt. on home beta blockers Rhythm:regular Rate:Normal     Neuro/Psych negative neurological ROS  negative psych ROS   GI/Hepatic negative GI ROS, Neg liver ROS, GERD-  Medicated and Controlled,  Endo/Other  negative endocrine ROSdiabetes, Type 2Diet diabetes  Renal/GU negative Renal ROS  negative genitourinary   Musculoskeletal  (+) Fibromyalgia -  Abdominal   Peds  Hematology negative hematology ROS (+)   Anesthesia Other Findings   Reproductive/Obstetrics negative OB ROS                          Anesthesia Physical Anesthesia Plan  ASA: II  Anesthesia Plan: MAC   Post-op Pain Management:    Induction:   Airway Management Planned:   Additional Equipment:   Intra-op Plan:   Post-operative Plan:   Informed Consent: I have reviewed the patients History and Physical, chart, labs and discussed the procedure including the risks, benefits and alternatives for the proposed anesthesia with the patient or authorized representative who has indicated his/her understanding and acceptance.   Dental Advisory Given  Plan Discussed with: CRNA and Surgeon  Anesthesia Plan Comments:         Anesthesia Quick Evaluation

## 2012-07-13 NOTE — Op Note (Signed)
[facility] [address] [place], [address]   ENDOSCOPIC ULTRASOUND PROCEDURE REPORT  PATIENT: Pamela Hatfield, Pamela Hatfield  MR#: [id number] BIRTHDATE: 1951-03-31  GENDER: Female ENDOSCOPIST: Rachael Fee, MD REFERRED BY:  Roetta Sessions, M.D. PROCEDURE DATE:  07/13/2012 PROCEDURE:   Upper EUS ASA CLASS:      Class III INDICATIONS:   Recent EGD for GERD, dysphagia; possible history of "gastric mass" by EGD in 2007, [place]. MEDICATIONS: MAC sedation, administered by CRNA  DESCRIPTION OF PROCEDURE:   After the risks benefits and alternatives of the procedure were  explained, informed consent was obtained. The patient was then placed in the left, lateral, decubitus postion and IV sedation was administered. Throughout the procedure, the patients blood pressure, pulse and oxygen saturations were monitored continuously.  Under direct visualization, the Pentax Radial EUS [id number]  endoscope was introduced through the mouth  and advanced to the second portion of the duodenum .  Water was used as necessary to provide an acoustic interface.  Upon completion of the imaging, water was removed and the patient was sent to the recovery room in satisfactory condition.   Endoscopic findings: 1. Small, smooth, round bulging inward of mucosa in distal stomach. This corresponds with images from Dr. Luvenia Starch EGD.  The lesion is approximately 1cm across. 2. Otherwise limited examination of the UGI tract was normal.  EUS findings: 1. The lesion above corresponds with a heterogneous (mixed hyperechoic to hypoechoic) lesion in submucosal layers of gastric wall. This measures 6.35mm across maximally. The lesion does not involve the muscularis propria layer of the gastric wall. Following EUS examination I elected to repeat biopsy of the lesion using tunnel biopsy method with forceps. 2. No perigastric adenopathy 3. Limited views of pancreas, spleen, liver were all  normal  Impression: 6.56mm mixed hyperchoic to hypoechoic lesion within submucosa layer of gastric wall, does not involve muscularis propria layer. This may be small lipoma, carcinoid or even hypertrophic mucosa from previous infection, ulcer.  The lesion may have been present since 2007 based on report of [facility] EGD.  I performed tunnel biopsies of the lesion.  Likely I will recommend repeat EUS in 12 months to get accurate interval measurements, however await final biopsy report.    _______________________________ eSigned:  Rachael Fee, MD 07/13/2012 9:17 AM

## 2012-07-13 NOTE — H&P (Signed)
  HPI: This is a woman who underwent EGD with Dr. Jena Gauss 06/14/2012 for GERD, dysphagia and was found to have small subepithelial mass in gastric antrum/body    Past Medical History  Diagnosis Date  . GERD (gastroesophageal reflux disease)   . Weight loss 02/25/2011  . Diabetes mellitus   . Hypertension   . Asthma   . Fibromyalgia     Past Surgical History  Procedure Date  . Abdominal hysterectomy     ovaries remain, done because of endometriosis  . Back surgery     lower back  . Rotator cuff repair     left  . Cholecystectomy   . Nose surgery   . Breast lumpectomy     both breast  . Esophageal dilation 06/14/2012    Procedure: ESOPHAGEAL DILATION;  Surgeon: Corbin Ade, MD;  [facility];  Service: Endoscopy;;    Current Facility-Administered Medications  Medication Dose Route Frequency Provider Last Rate Last Dose  . 0.9 %  sodium chloride infusion   Intravenous Continuous Rachael Fee, MD        Allergies as of 06/22/2012 - Review Complete 06/14/2012  Allergen Reaction Noted  . Clarithromycin  03/31/2011  . Codeine Itching 03/31/2011  . Sulfa antibiotics Hives 03/31/2011  . Tape Itching 06/01/2011    Family History  Problem Relation Age of Onset  . Colon cancer Father   . Colon cancer Maternal Aunt   . Colon cancer Maternal Uncle     History   Social History  . Marital Status: Divorced    Spouse Name: N/A    Number of Children: N/A  . Years of Education: N/A   Occupational History  . Not on file.   Social History Main Topics  . Smoking status: Former Smoker -- 0.5 packs/day    Types: Cigarettes  . Smokeless tobacco: Not on file   Comment: quit about 25 + years ago  . Alcohol Use: No  . Drug Use: No  . Sexually Active: Not on file   Other Topics Concern  . Not on file   Social History Narrative  . No narrative on file      Physical Exam: BP 175/103  Pulse 71  Temp 98.3 F (36.8 C) (Oral)  Resp 13  Ht 5\' 5"  (1.651 m)   Wt 173 lb (78.472 kg)  BMI 28.79 kg/m2  SpO2 99% Constitutional: generally well-appearing Psychiatric: alert and oriented x3 Abdomen: soft, nontender, nondistended, no obvious ascites, no peritoneal signs, normal bowel sounds     Assessment and plan: 61 y.o. female with subepithelial gastric lesion  For eus +/- FNA today

## 2012-07-13 NOTE — Anesthesia Postprocedure Evaluation (Signed)
  Anesthesia Post-op Note  Patient: Pamela Hatfield  Procedure(s) Performed: Procedure(s) (LRB): UPPER ENDOSCOPIC ULTRASOUND (EUS) LINEAR (N/A)  Patient Location: PACU  Anesthesia Type: MAC  Level of Consciousness: awake and alert   Airway and Oxygen Therapy: Patient Spontanous Breathing  Post-op Pain: mild  Post-op Assessment: Post-op Vital signs reviewed, Patient's Cardiovascular Status Stable, Respiratory Function Stable, Patent Airway and No signs of Nausea or vomiting  Post-op Vital Signs: stable  Complications: No apparent anesthesia complications

## 2012-07-13 NOTE — Transfer of Care (Signed)
Immediate Anesthesia Transfer of Care Note  Patient: Pamela Hatfield  Procedure(s) Performed: Procedure(s) (LRB): UPPER ENDOSCOPIC ULTRASOUND (EUS) LINEAR (N/A)  Patient Location: PACU  Anesthesia Type: MAC  Level of Consciousness: sedated, patient cooperative and responds to stimulaton  Airway & Oxygen Therapy: Patient Spontanous Breathing and Patient connected to face mask oxgen  Post-op Assessment: Report given to PACU RN and Post -op Vital signs reviewed and stable  Post vital signs: Reviewed and stable  Complications: No apparent anesthesia complications

## 2012-07-14 ENCOUNTER — Encounter (HOSPITAL_COMMUNITY): Payer: Self-pay | Admitting: Gastroenterology

## 2012-07-20 ENCOUNTER — Ambulatory Visit (INDEPENDENT_AMBULATORY_CARE_PROVIDER_SITE_OTHER): Payer: Medicare Other | Admitting: Otolaryngology

## 2012-07-20 DIAGNOSIS — J320 Chronic maxillary sinusitis: Secondary | ICD-10-CM

## 2012-07-20 DIAGNOSIS — J322 Chronic ethmoidal sinusitis: Secondary | ICD-10-CM

## 2012-08-17 ENCOUNTER — Ambulatory Visit (INDEPENDENT_AMBULATORY_CARE_PROVIDER_SITE_OTHER): Payer: Medicare Other | Admitting: Otolaryngology

## 2012-08-24 ENCOUNTER — Ambulatory Visit (INDEPENDENT_AMBULATORY_CARE_PROVIDER_SITE_OTHER): Payer: Medicare Other | Admitting: Otolaryngology

## 2012-08-24 DIAGNOSIS — J322 Chronic ethmoidal sinusitis: Secondary | ICD-10-CM

## 2012-08-24 DIAGNOSIS — J320 Chronic maxillary sinusitis: Secondary | ICD-10-CM

## 2012-10-05 ENCOUNTER — Ambulatory Visit (INDEPENDENT_AMBULATORY_CARE_PROVIDER_SITE_OTHER): Payer: Medicare Other | Admitting: Otolaryngology

## 2012-10-05 DIAGNOSIS — J320 Chronic maxillary sinusitis: Secondary | ICD-10-CM

## 2013-01-04 ENCOUNTER — Ambulatory Visit (INDEPENDENT_AMBULATORY_CARE_PROVIDER_SITE_OTHER): Payer: Medicare Other | Admitting: Otolaryngology

## 2013-01-04 DIAGNOSIS — J310 Chronic rhinitis: Secondary | ICD-10-CM

## 2013-01-04 DIAGNOSIS — J309 Allergic rhinitis, unspecified: Secondary | ICD-10-CM

## 2013-01-17 ENCOUNTER — Other Ambulatory Visit (HOSPITAL_COMMUNITY): Payer: Self-pay | Admitting: Internal Medicine

## 2013-01-17 DIAGNOSIS — K319 Disease of stomach and duodenum, unspecified: Secondary | ICD-10-CM

## 2013-01-19 ENCOUNTER — Ambulatory Visit (HOSPITAL_COMMUNITY)
Admission: RE | Admit: 2013-01-19 | Discharge: 2013-01-19 | Disposition: A | Discharge status: Home / Self Care | Payer: Medicare Other | Source: Ambulatory Visit | Attending: Internal Medicine | Admitting: Internal Medicine

## 2013-01-19 DIAGNOSIS — K319 Disease of stomach and duodenum, unspecified: Secondary | ICD-10-CM

## 2013-01-19 DIAGNOSIS — R1011 Right upper quadrant pain: Secondary | ICD-10-CM | POA: Insufficient documentation

## 2013-01-19 DIAGNOSIS — R1909 Other intra-abdominal and pelvic swelling, mass and lump: Secondary | ICD-10-CM | POA: Insufficient documentation

## 2013-02-28 ENCOUNTER — Other Ambulatory Visit (HOSPITAL_COMMUNITY): Payer: Self-pay | Admitting: Internal Medicine

## 2013-02-28 DIAGNOSIS — R109 Unspecified abdominal pain: Secondary | ICD-10-CM

## 2013-03-07 ENCOUNTER — Ambulatory Visit (HOSPITAL_COMMUNITY): Payer: Medicare Other

## 2013-03-07 ENCOUNTER — Ambulatory Visit (HOSPITAL_COMMUNITY): Admission: RE | Admit: 2013-03-07 | Payer: Medicare Other | Source: Ambulatory Visit

## 2013-03-07 ENCOUNTER — Ambulatory Visit (HOSPITAL_COMMUNITY)
Admission: RE | Admit: 2013-03-07 | Discharge: 2013-03-07 | Disposition: A | Discharge status: Home / Self Care | Payer: Medicare Other | Source: Ambulatory Visit | Attending: Internal Medicine | Admitting: Internal Medicine

## 2013-03-07 DIAGNOSIS — R109 Unspecified abdominal pain: Secondary | ICD-10-CM | POA: Insufficient documentation

## 2013-03-07 DIAGNOSIS — R599 Enlarged lymph nodes, unspecified: Secondary | ICD-10-CM | POA: Insufficient documentation

## 2013-03-07 DIAGNOSIS — R935 Abnormal findings on diagnostic imaging of other abdominal regions, including retroperitoneum: Secondary | ICD-10-CM | POA: Insufficient documentation

## 2013-03-14 ENCOUNTER — Encounter: Payer: Self-pay | Admitting: *Deleted

## 2013-03-14 DIAGNOSIS — IMO0001 Reserved for inherently not codable concepts without codable children: Secondary | ICD-10-CM | POA: Insufficient documentation

## 2013-03-14 DIAGNOSIS — I10 Essential (primary) hypertension: Secondary | ICD-10-CM | POA: Insufficient documentation

## 2013-03-14 DIAGNOSIS — E782 Mixed hyperlipidemia: Secondary | ICD-10-CM

## 2013-03-15 ENCOUNTER — Encounter (HOSPITAL_COMMUNITY): Payer: Self-pay

## 2013-03-15 ENCOUNTER — Encounter (HOSPITAL_COMMUNITY): Payer: Medicare Other | Attending: Internal Medicine

## 2013-03-15 ENCOUNTER — Encounter: Payer: Self-pay | Admitting: Internal Medicine

## 2013-03-15 ENCOUNTER — Ambulatory Visit (INDEPENDENT_AMBULATORY_CARE_PROVIDER_SITE_OTHER): Payer: Medicare Other | Admitting: Internal Medicine

## 2013-03-15 VITALS — BP 139/85 | HR 67 | Ht 65.0 in | Wt 170.4 lb

## 2013-03-15 VITALS — BP 143/89 | HR 69 | Temp 98.5°F | Resp 16 | Wt 171.1 lb

## 2013-03-15 DIAGNOSIS — J45909 Unspecified asthma, uncomplicated: Secondary | ICD-10-CM | POA: Insufficient documentation

## 2013-03-15 DIAGNOSIS — I10 Essential (primary) hypertension: Secondary | ICD-10-CM

## 2013-03-15 DIAGNOSIS — K869 Disease of pancreas, unspecified: Secondary | ICD-10-CM

## 2013-03-15 DIAGNOSIS — K8689 Other specified diseases of pancreas: Secondary | ICD-10-CM

## 2013-03-15 DIAGNOSIS — E119 Type 2 diabetes mellitus without complications: Secondary | ICD-10-CM | POA: Insufficient documentation

## 2013-03-15 LAB — CBC WITH DIFFERENTIAL/PLATELET
Basophils Absolute: 0.1 10*3/uL (ref 0.0–0.1)
Basophils Relative: 2 % — ABNORMAL HIGH (ref 0–1)
Eosinophils Absolute: 0.6 10*3/uL (ref 0.0–0.7)
Eosinophils Relative: 8 % — ABNORMAL HIGH (ref 0–5)
HCT: 37.4 % (ref 36.0–46.0)
Hemoglobin: 12.2 g/dL (ref 12.0–15.0)
Lymphocytes Relative: 37 % (ref 12–46)
Lymphs Abs: 2.8 10*3/uL (ref 0.7–4.0)
MCH: 29.3 pg (ref 26.0–34.0)
MCHC: 32.6 g/dL (ref 30.0–36.0)
MCV: 89.7 fL (ref 78.0–100.0)
Monocytes Absolute: 0.6 10*3/uL (ref 0.1–1.0)
Monocytes Relative: 8 % (ref 3–12)
Neutro Abs: 3.5 10*3/uL (ref 1.7–7.7)
Neutrophils Relative %: 46 % (ref 43–77)
Platelets: 293 10*3/uL (ref 150–400)
RBC: 4.17 MIL/uL (ref 3.87–5.11)
RDW: 13.4 % (ref 11.5–15.5)
WBC: 7.5 10*3/uL (ref 4.0–10.5)

## 2013-03-15 LAB — COMPREHENSIVE METABOLIC PANEL
ALT: 16 U/L (ref 0–35)
AST: 24 U/L (ref 0–37)
Albumin: 3.5 g/dL (ref 3.5–5.2)
Alkaline Phosphatase: 140 U/L — ABNORMAL HIGH (ref 39–117)
BUN: 11 mg/dL (ref 6–23)
CO2: 27 mEq/L (ref 19–32)
Calcium: 9.1 mg/dL (ref 8.4–10.5)
Chloride: 101 mEq/L (ref 96–112)
Creatinine, Ser: 0.99 mg/dL (ref 0.50–1.10)
GFR calc Af Amer: 69 mL/min — ABNORMAL LOW (ref >90)
GFR calc non Af Amer: 59 mL/min — ABNORMAL LOW (ref >90)
Glucose, Bld: 99 mg/dL (ref 70–99)
Potassium: 3.9 mEq/L (ref 3.5–5.1)
Sodium: 137 mEq/L (ref 135–145)
Total Bilirubin: 0.3 mg/dL (ref 0.3–1.2)
Total Protein: 7.5 g/dL (ref 6.0–8.3)

## 2013-03-15 LAB — PROTIME-INR
INR: 1.01 (ref 0.00–1.49)
Prothrombin Time: 13.1 seconds (ref 11.6–15.2)

## 2013-03-15 LAB — LACTATE DEHYDROGENASE: LDH: 189 U/L (ref 94–250)

## 2013-03-15 LAB — APTT: aPTT: 29 seconds (ref 24–37)

## 2013-03-15 LAB — LIPASE, BLOOD: Lipase: 54 U/L (ref 11–59)

## 2013-03-15 NOTE — Patient Instructions (Addendum)
Your physician recommends that you schedule a follow-up appointment in: Follow up if needed

## 2013-03-15 NOTE — Progress Notes (Signed)
Venipuncture w/ 23g butterfly to right AC.  Carmin Muskrat Ballantine tolerated procedure well and w/o incident.

## 2013-03-15 NOTE — Progress Notes (Signed)
HPI Patient is a 62 yo who was referred for evaluation of hypertension.  The patient is followed by Dr Margo Aye  She was seen in June .  BP was very labile 140s to 170s.  Also had developed headaches, blurred vision BP seemed to go up with acitvity.  Also compliained of some dizziness.  BP at that clinic visit was 133/80  Since that clinic appt over last few wks her BP has not been a high.  She has made no changes in her medicines. No blurred vision.  Denies CP  Breathing is OK  She has an appt in oncology today for abnormality on pancreas Allergies  Allergen Reactions  . Clarithromycin     Does not know  . Codeine Itching    Headaches  . Sulfa Antibiotics Hives  . Tape Itching    Current Outpatient Prescriptions  Medication Sig Dispense Refill  . ALPRAZolam (XANAX) 1 MG tablet Take 0.5-1 mg by mouth daily.       . Calcium Carbonate-Vitamin D (CALCIUM + D PO) Take 1 tablet by mouth 2 (two) times daily.      . ciclesonide (ALVESCO) 160 MCG/ACT inhaler Inhale 1 puff into the lungs 2 (two) times daily.      . enalapril (VASOTEC) 10 MG tablet Take 10 mg by mouth daily.      Marland Kitchen FLUoxetine (PROZAC) 40 MG capsule Take 40 mg by mouth daily.      Marland Kitchen gabapentin (NEURONTIN) 600 MG tablet Take 600 mg by mouth 3 (three) times daily.        Marland Kitchen ipratropium (ATROVENT) 0.03 % nasal spray Place 2 sprays into the nose 2 (two) times daily.      . metoprolol (LOPRESSOR) 50 MG tablet Take 50 mg by mouth 2 (two) times daily.      . Multiple Vitamins-Minerals (MULTIVITAMIN WITH MINERALS) tablet Take 1 tablet by mouth daily.      . pantoprazole (PROTONIX) 40 MG tablet Take 40 mg by mouth daily.      . traMADol (ULTRAM) 50 MG tablet Take 100 mg by mouth 3 (three) times daily. Pain.      . pregabalin (LYRICA) 50 MG capsule Take 50 mg by mouth daily. One daily       No current facility-administered medications for this visit.    Past Medical History  Diagnosis Date  . GERD (gastroesophageal reflux disease)   .  Weight loss 02/25/2011  . Diabetes mellitus   . Hypertension   . Asthma   . Fibromyalgia   . Myalgia and myositis, unspecified   . Hyperlipidemia     Past Surgical History  Procedure Laterality Date  . Abdominal hysterectomy      ovaries remain, done because of endometriosis  . Back surgery      lower back  . Rotator cuff repair      left  . Cholecystectomy    . Nose surgery    . Breast lumpectomy      both breast  . Esophageal dilation  06/14/2012    Procedure: ESOPHAGEAL DILATION;  Surgeon: Corbin Ade, MD;  [facility];  Service: Endoscopy;;  . Eus  07/13/2012    Procedure: UPPER ENDOSCOPIC ULTRASOUND (EUS) LINEAR;  Surgeon: Rachael Fee, MD;  [facility]: [facility];  Service: Endoscopy;  Laterality: N/A;    Family History  Problem Relation Age of Onset  . Colon cancer Father   . Colon cancer Maternal Aunt   . Colon cancer Maternal  Uncle     History   Social History  . Marital Status: Divorced    Spouse Name: N/A    Number of Children: N/A  . Years of Education: N/A   Occupational History  . Not on file.   Social History Main Topics  . Smoking status: Former Smoker -- 0.50 packs/day    Types: Cigarettes  . Smokeless tobacco: Not on file     Comment: quit about 25 + years ago  . Alcohol Use: No  . Drug Use: No  . Sexually Active: Not on file   Other Topics Concern  . Not on file   Social History Narrative  . No narrative on file    Review of Systems:  All systems reviewed.  They are negative to the above problem except as previously stated.  Vital Signs: BP 139/85  Pulse 67  Ht 5\' 5"  (1.651 m)  Wt 170 lb 6.4 oz (77.293 kg)  BMI 28.36 kg/m2 Recheck confirms  135/84 Physical Exam Patient is in NAD HEENT:  Normocephalic, atraumatic. EOMI, PERRLA.  Neck: JVP is normal.  No bruits.  Lungs: clear to auscultation. No rales no wheezes.  Heart: Regular rate and rhythm. Normal S1, S2. No S3.   No significant murmurs. PMI not  displaced.  Abdomen:  Supple, nontender. Normal bowel sounds. No masses. No hepatomegaly.  Extremities:   Good distal pulses throughout. No lower extremity edema.  Musculoskeletal :moving all extremities.  Neuro:   alert and oriented x3.  CN II-XII grossly intact.  EKG  SR 67  Nonspecific ST T wave changes. Assessment and Plan:  1.  HTN  Patient has had very labilie and high blood pressures.  These have improved  I cannot explain. I would keep on same regimen.  Will be available as needed if BP becomes a problem again.  Will not schedule further testing.   I am not convinced related to pancreatic lesion but work up should clarify, esp if BP jumps up.

## 2013-03-15 NOTE — Progress Notes (Signed)
[facility] Hematology/Oncology Consultation   Name: Pamela Hatfield      MRN: [id number]    Location: Room/bed info not found  Date: 03/15/2013 Time:2:13 PM   REFERRING PHYSICIAN:   REFERRING MD:  [name], MD   REASON FOR CONSULT:  Abnormal mass in tail of pancreas measuring 2.8 cm, second mass measuring 2 cm located just anterior to the IVC and just inferior to the third portion of the aorta seen on MRI Abdomen without contrast done on 03/07/2013. Patient is ready for oncology evaluation for possible pancreatic cancer.     DIAGNOSIS:  As abvoe  HISTORY OF PRESENT ILLNESS:  Pamela Hatfield is a 62 year old Caucasian female with past medical history significant for diabetes mellitus, hypertension, asthma, fibromyalgia, GERD who recently has been noticing recurrent abdominal pain. The pain is located in the right upper abdomen, has been ongoing for the last 3 months. It is intermittent and she does not identify any exacerbating or relieving factors. She had MRI of the abdomen done on June 21 200 etiology of pain in this report 2 vessels, a 2.8 cm pancreatic tail mass and a 3 cm mass lesion located just anterior to the IVC raising suspicion of pancreatic malignancy. She is status post cholecystectomy in the past. She had EGD and EUS/biopsy of the stomach by Dr.Rourk/Dr.Jacobs in October 2013 which was reportedly negative for malignancy. She denies any radiation of the pain denies history of peptic ulcer disease but has GERD. She also has noticed unintentional weight loss of about dear pulse in the last 9 months or so despite eating fairly well. She has intermittent constipation and narrow caliber stools but states colonoscopy was unremarkable last year also. Denies any BRBPR, melena or hemoptysis. She is physically active and empirically. No new cough, chest pain, dyspnea or hemoptysis. No new bone pains.  PAST MEDICAL HISTORY:   Past Medical History  Diagnosis Date  . GERD  (gastroesophageal reflux disease)   . Weight loss 02/25/2011  . Diabetes mellitus   . Hypertension   . Asthma   . Fibromyalgia   . Myalgia and myositis, unspecified   . Hyperlipidemia     ALLERGIES: Allergies  Allergen Reactions  . Clarithromycin     Does not know  . Codeine Itching    Headaches  . Sulfa Antibiotics Hives  . Tape Itching     MEDICATIONS: Current outpatient prescriptions:ALPRAZolam (XANAX) 1 MG tablet, Take 0.5-1 mg by mouth daily. , Disp: , Rfl: ;  ciclesonide (ALVESCO) 160 MCG/ACT inhaler, Inhale 1 puff into the lungs 2 (two) times daily., Disp: , Rfl: ;  enalapril (VASOTEC) 10 MG tablet, Take 10 mg by mouth daily., Disp: , Rfl: ;  FLUoxetine (PROZAC) 40 MG capsule, Take 40 mg by mouth daily., Disp: , Rfl:  gabapentin (NEURONTIN) 600 MG tablet, Take 600 mg by mouth 3 (three) times daily.  , Disp: , Rfl: ;  ipratropium (ATROVENT) 0.03 % nasal spray, Place 2 sprays into the nose 2 (two) times daily., Disp: , Rfl: ;  meloxicam (MOBIC) 15 MG tablet, Take 15 mg by mouth daily., Disp: , Rfl: ;  methocarbamol (ROBAXIN) 750 MG tablet, Take 750 mg by mouth 3 (three) times daily. 1-2 tabs TID, Disp: , Rfl:  metoprolol (LOPRESSOR) 50 MG tablet, Take 50 mg by mouth 2 (two) times daily., Disp: , Rfl: ;  pantoprazole (PROTONIX) 40 MG tablet, Take 40 mg by mouth daily., Disp: , Rfl: ;  traMADol (ULTRAM) 50 MG  tablet, Take 100 mg by mouth 3 (three) times daily. Pain., Disp: , Rfl:      PAST SURGICAL HISTORY Past Surgical History  Procedure Laterality Date  . Abdominal hysterectomy      ovaries remain, done because of endometriosis  . Back surgery      lower back  . Rotator cuff repair      left  . Cholecystectomy    . Nose surgery    . Breast lumpectomy      both breast  . Esophageal dilation  06/14/2012    Procedure: ESOPHAGEAL DILATION;  Surgeon: Corbin Ade, MD;  [facility];  Service: Endoscopy;;  . Eus  07/13/2012    Procedure: UPPER ENDOSCOPIC  ULTRASOUND (EUS) LINEAR;  Surgeon: Rachael Fee, MD;  [facility];  Service: Endoscopy;  Laterality: N/A;    FAMILY HISTORY: Family History  Problem Relation Age of Onset  . Colon cancer Father   . Colon cancer Maternal Aunt   . Colon cancer Maternal Uncle     SOCIAL HISTORY:  reports that she has quit smoking. Her smoking use included Cigarettes. She smoked 0.50 packs per day. She does not have any smokeless tobacco history on file. She reports that she does not drink alcohol or use illicit drugs.  PERFORMANCE STATUS: The patient's performance status is 1 - Symptomatic but completely ambulatory  PHYSICAL EXAM: Most Recent Vital Signs: Blood pressure 143/89, pulse 69, temperature 98.5 F (36.9 C), temperature source Oral, resp. rate 16, weight 171 lb 1.6 oz (77.61 kg). BP 143/89  Pulse 69  Temp(Src) 98.5 F (36.9 C) (Oral)  Resp 16  Wt 171 lb 1.6 oz (77.61 kg)  BMI 28.47 kg/m2  General Appearance:    Alert, cooperative, no distress, appears stated age  Head:    Normocephalic, without obvious abnormality, atraumatic  Eyes:    PERRL, conjunctiva/corneas clear, EOM's intact, fundi    benign, both eyes  Ears:    Normal TM's and external ear canals, both ears  Nose:   Nares normal, septum midline, mucosa normal, no drainage    or sinus tenderness  Throat:   Lips, mucosa, and tongue normal; teeth and gums normal  Neck:   Supple, symmetrical, trachea midline, no adenopathy;    thyroid:  no enlargement/tenderness/nodules; no carotid   bruit or JVD  Back:     Symmetric, no curvature, ROM normal, no CVA tenderness  Lungs:     Clear to auscultation bilaterally, respirations unlabored  Chest Wall:    No tenderness or deformity   Heart:    Regular rate and rhythm, S1 and S2 normal, no murmur, rub   or gallop  Breast Exam:    No tenderness, masses, or nipple abnormality  Abdomen:     Soft, non-tender, bowel sounds active all four quadrants,    no masses, no organomegaly   Genitalia:    Normal female without lesion, discharge or tenderness  Rectal:    Normal tone, normal prostate, no masses or tenderness;   guaiac negative stool  Extremities:   Extremities normal, atraumatic, no cyanosis or edema  Pulses:   2+ and symmetric all extremities  Skin:   Skin color, texture, turgor normal, no rashes or lesions  Lymph nodes:   Cervical, supraclavicular, and axillary nodes normal  Neurologic:   CNII-XII intact, normal strength, sensation and reflexes    throughout    LABORATORY DATA:  No results found for this or any previous visit (from the past  48 hour(s)).    RADIOGRAPHY: MRI ABDOMEN WITHOUT CONTRAST  Technique: Multiplanar multisequence MR imaging of the abdomen was  performed. No intravenous contrast was administered.  Comparison: CT scan 01/29/2013.  Findings: As demonstrated on the CT scan there is a bilobed lesion  in the pancreatic tail demonstrating slight increased T2 signal  intensity. It measures approximately the 2.8 cm in length. This  is suspicious for a pancreatic neuroendocrine tumor. There is also  a 3 cm lesion located just anterior to the IVC and just inferior to  the third portion the duodenum. This could be a metastatic  disease, adenopathy or paraganglioma. Recommend clinical  correlation with any laboratory data or physical examination  findings may suggest a functional neuroendocrine tumor. Endoscopic  ultrasound biopsy of the pancreatic tail lesion may be indicated.  Nuclear medicine Octreoscan would be another possibly helpful test.  There are small bilateral renal lesions. There are too hemorrhagic  cyst on the left. The larger cyst on the left is bilobed or  septated but I do not see any worrisome imaging features.  The liver is unremarkable. No worrisome lesions. Mild common bile  duct dilatation status post cholecystectomy. The pancreatic head  body are normal. The adrenal glands are normal. The spleen is  normal in size.  No focal lesions. The aorta is normal in caliber.  No significant bony findings.  IMPRESSION:  1. Elongated bilobed pancreatic tail lesion could be a pancreatic  neuroendocrine tumor. The 3 cm retroperitoneal mass could be  related. Recommend clinical correlation regarding the possibility  of a functional no endocrine tumor. Endoscopic biopsy of the  pancreatic tail lesion may be indicated. Octreoscan may also be  helpful depending on the clinical situation.  2. Benign bilateral renal lesions.  Original Report Authenticated By: Rudie Meyer, M.D.    ASSESSMENT and PLAN:  62 year old female patient with history of diabetes mellitus since 2007, a 28 lb unintentional weight loss in the last 9 months, recurrent upper right-sided abdominal pain for the last few months of unclear etiology who had radiologic evaluation with MRI showing 2 masses, one in the pancreatic tail area measuring 2.8 cm and second mass measuring 3 cm located just anterior to the IVC and just inferior to the third portion of the duodenum raising suspicion for pancreatic malignancy versus other etiology. Patient definitely needs further evaluation. Plan therefore is to draw labs today including CBC/differential, metabolic panel, liver function tests, lipase, CA 19-9, serum chromogranin A level. Will also get baseline PT and PTT since we need to pursue invasive procedure and biopsy. Schedule for PET scan evaluation for pancreatic cancer diagnosis and staging. Will make referral for her to see GI Dr. Rob Bunting for endoscopy ultrasound and biopsy of the pancreatic mass for tissue diagnosis. Patient will return for followup in 2 weeks and to make further plan of management based upon results of above workup and biopsy report. She was advised to hold ASA and NSAIDs till invasive procedures are completed. Patient has been explained above details and plan, and she is agreeable to this.   Janese Banks, MD 03/15/2013

## 2013-03-15 NOTE — Patient Instructions (Signed)
[facility] Discharge Instructions  RECOMMENDATIONS MADE BY THE CONSULTANT AND ANY TEST RESULTS WILL BE SENT TO YOUR REFERRING PHYSICIAN.  EXAM FINDINGS BY THE PHYSICIAN TODAY AND SIGNS OR SYMPTOMS TO REPORT TO CLINIC OR PRIMARY PHYSICIAN: Exam findings as discussed by Dr. Sherrlyn Hock.  SPECIAL INSTRUCTIONS/FOLLOW-UP: 1.  You had labs today, and you are also being scheduled for a PET scan (at [facility]).  For your PET scan, nothing by mouth (no sugars especially - ex., chewing, breath mints, etc.) for 6 hours before your study is scheduled. 2.  We referred you back to Dr. Christella Hartigan (at [facility]) for evaluation for a biopsy. 3.  Please keep your appointment to be seen in our office by the MD in 2 weeks.  Thank you for choosing [facility] to provide your oncology and hematology care.  To afford each patient quality time with our providers, please arrive at least 15 minutes before your scheduled appointment time.  With your help, our goal is to use those 15 minutes to complete the necessary work-up to ensure our physicians have the information they need to help with your evaluation and healthcare recommendations.    Effective January 1st, 2014, we ask that you re-schedule your appointment with our physicians should you arrive 10 or more minutes late for your appointment.  We strive to give you quality time with our providers, and arriving late affects you and other patients whose appointments are after yours.    Again, thank you for choosing [facility].  Our hope is that these requests will decrease the amount of time that you wait before being seen by our physicians.       _____________________________________________________________  Should you have questions after your visit to [facility], please contact our office at [phone] between the hours of 8:30 a.m. and 5:00 p.m.  Voicemails left after 4:30 p.m. will not be returned until  the following business day.  For prescription refill requests, have your pharmacy contact our office with your prescription refill request.

## 2013-03-16 LAB — CANCER ANTIGEN 19-9: CA 19-9: 20 U/mL — ABNORMAL LOW (ref <35.0)

## 2013-03-20 ENCOUNTER — Encounter: Payer: Self-pay | Admitting: Gastroenterology

## 2013-03-20 ENCOUNTER — Other Ambulatory Visit: Payer: Self-pay

## 2013-03-20 ENCOUNTER — Telehealth: Payer: Self-pay

## 2013-03-20 DIAGNOSIS — R932 Abnormal findings on diagnostic imaging of liver and biliary tract: Secondary | ICD-10-CM

## 2013-03-20 NOTE — Telephone Encounter (Signed)
EUS scheduled, pt instructed and medications reviewed.  Patient instructions mailed to home.  Patient to call with any questions or concerns.  

## 2013-03-20 NOTE — Telephone Encounter (Signed)
Left message on machine to call back  

## 2013-03-21 ENCOUNTER — Encounter (HOSPITAL_COMMUNITY): Payer: Self-pay | Admitting: Pharmacy Technician

## 2013-03-21 ENCOUNTER — Encounter (HOSPITAL_COMMUNITY): Payer: Self-pay | Admitting: *Deleted

## 2013-03-21 LAB — CHROMOGRANIN A
Chromogranin A: 84 ng/mL — ABNORMAL HIGH (ref 1.9–15.0)
Chromogranin A: 86 ng/mL — ABNORMAL HIGH (ref 1.9–15.0)

## 2013-03-23 ENCOUNTER — Encounter (HOSPITAL_COMMUNITY)
Admission: RE | Admit: 2013-03-23 | Discharge: 2013-03-23 | Disposition: A | Discharge status: Home / Self Care | Payer: Medicare Other | Source: Ambulatory Visit | Attending: Oncology | Admitting: Oncology

## 2013-03-23 DIAGNOSIS — K869 Disease of pancreas, unspecified: Secondary | ICD-10-CM | POA: Insufficient documentation

## 2013-03-23 DIAGNOSIS — R1909 Other intra-abdominal and pelvic swelling, mass and lump: Secondary | ICD-10-CM | POA: Insufficient documentation

## 2013-03-23 DIAGNOSIS — K8689 Other specified diseases of pancreas: Secondary | ICD-10-CM

## 2013-03-23 LAB — GLUCOSE, CAPILLARY: Glucose-Capillary: 100 mg/dL — ABNORMAL HIGH (ref 70–99)

## 2013-03-23 MED ORDER — FLUDEOXYGLUCOSE F - 18 (FDG) INJECTION
17.1000 | Freq: Once | INTRAVENOUS | Status: AC | PRN
  Administered 2013-03-23: 17.1 via INTRAVENOUS

## 2013-03-29 ENCOUNTER — Encounter (HOSPITAL_BASED_OUTPATIENT_CLINIC_OR_DEPARTMENT_OTHER): Payer: Medicare Other

## 2013-03-29 VITALS — BP 144/88 | HR 68 | Temp 97.4°F | Resp 20 | Ht 65.0 in | Wt 171.3 lb

## 2013-03-29 DIAGNOSIS — D3A00 Benign carcinoid tumor of unspecified site: Secondary | ICD-10-CM

## 2013-03-29 DIAGNOSIS — D3A8 Other benign neuroendocrine tumors: Secondary | ICD-10-CM

## 2013-03-29 DIAGNOSIS — R1900 Intra-abdominal and pelvic swelling, mass and lump, unspecified site: Secondary | ICD-10-CM

## 2013-03-29 NOTE — Progress Notes (Signed)
[facility] [phone]   [phone]  OFFICE PROGRESS NOTE  Pamela Pizza, MD [facility] [address]  DIAGNOSIS:  1. Pancreatic Mass  2.Retroperitoneal mass anterior to the IVC.  ONCOLOGIC HISTORY: 62 year old woman seen by Dr Janese Banks on 03/15/13 for abdominal masses.   Note reads "62 year old Caucasian female with past medical history significant for diabetes mellitus, hypertension, asthma, fibromyalgia, GERD who recently has been noticing recurrent abdominal pain. The pain is located in the right upper abdomen, has been ongoing for the last 3 months. It is intermittent and she does not identify any exacerbating or relieving factors. She had MRI of the abdomen done on June 21 200 etiology of pain in this report 2 vessels, a 2.8 cm pancreatic tail mass and a 3 cm mass lesion located just anterior to the IVC raising suspicion of pancreatic malignancy. She is status post cholecystectomy in the past. She had EGD and EUS/biopsy of the stomach by Dr.Rourk/Dr.Jacobs in October 2013 which was reportedly negative for malignancy. She denies any radiation of the pain denies history of peptic ulcer disease but has GERD."    INTERVAL HISTORY:   Pamela [facility] 62 y.o. female returns to the clinic today for  Scheduled follow up visit. Following her initial visit she was sent for a PET/CT scan and had blood work done for CA 19-9 and Chromogranin which came back as 20 and 86 respectively. Patient tells me that she has not had any significant changes in her condition since our last visit except for feeling more tired.  She complains of occasional flushes and chills. She otherwise eating well.  She was accompanied by Pamela [facility] her cousin and Pamela [facility] her friend.  She is here today to review the PET scan he may just have the results and to determine what is the next most appropriate step to take.  She states that she scheduled to see Dr. Christella Hartigan on the 31st of this  month.  I reviewed the PET/CT images and showed it to the patient.  MEDICAL HISTORY: Past Medical History  Diagnosis Date  . GERD (gastroesophageal reflux disease)   . Weight loss 02/25/2011  . Hypertension   . Asthma   . Hyperlipidemia   . Sleep apnea     no cpap used, pt does not like  . Diabetes mellitus     diet controlled  . Fibromyalgia   . Myalgia and myositis, unspecified   . Dysrhythmia     hx rapid heart beat    ALLERGIES:  is allergic to clarithromycin; codeine; sulfa antibiotics; and tape.  MEDICATIONS:  Current Outpatient Prescriptions  Medication Sig Dispense Refill  . ALPRAZolam (XANAX) 1 MG tablet Take 0.5-1 mg by mouth daily.       . Biotin (BIOTIN 5000) 5 MG CAPS Take 1 capsule by mouth daily.      . cetirizine (ZYRTEC) 10 MG tablet Take 10 mg by mouth daily.      . enalapril (VASOTEC) 10 MG tablet Take 10 mg by mouth every morning.       Marland Kitchen FLUoxetine (PROZAC) 20 MG capsule Take 20 mg by mouth every morning.      . gabapentin (NEURONTIN) 600 MG tablet Take 600 mg by mouth 3 (three) times daily.       . meloxicam (MOBIC) 15 MG tablet Take 15 mg by mouth daily.      . metoprolol (LOPRESSOR) 50 MG tablet Take 50 mg by mouth 2 (  two) times daily.      . Multiple Vitamin (MULTIVITAMIN WITH MINERALS) TABS Take 1 tablet by mouth daily.      . pantoprazole (PROTONIX) 40 MG tablet Take 40 mg by mouth daily.      . traMADol (ULTRAM) 50 MG tablet Take 100 mg by mouth 3 (three) times daily. Pain.      . methocarbamol (ROBAXIN) 750 MG tablet Take 750-1,500 mg by mouth 3 (three) times daily.        No current facility-administered medications for this visit.    SURGICAL HISTORY:  Past Surgical History  Procedure Laterality Date  . Rotator cuff repair  yrs ago    left  . Nose surgery  yrs ago  . Breast lumpectomy      both breast  . Esophageal dilation  06/14/2012    Procedure: ESOPHAGEAL DILATION;  Surgeon: Corbin Ade, MD;  Location: [facility];  Service:  Endoscopy;;  . Eus  07/13/2012    Procedure: UPPER ENDOSCOPIC ULTRASOUND (EUS) LINEAR;  Surgeon: Rachael Fee, MD;  Location: [facility];  Service: Endoscopy;  Laterality: N/A;  . Back surgery  yrs ago    lower back  . Abdominal hysterectomy  yrs ago    ovaries remain, done because of endometriosis  . Cholecystectomy  yrs ago     REVIEW OF SYSTEMS: 14 point review of system is as in the history above otherwise negative.   PHYSICAL EXAMINATION:  Blood pressure 144/88, pulse 68, temperature 97.4 F (36.3 C), temperature source Oral, resp. rate 20, height 5\' 5"  (1.651 m), weight 171 lb 4.8 oz (77.701 kg). GENERAL: No distress. SKIN:  No rashes or significant lesions  HEAD: Normocephalic, No masses, lesions, tenderness or abnormalities  EYES: Conjunctiva are pink and non-injected  ENT: External ears normal ,lips, buccal mucosa, and tongue normal and mucous membranes are moist  LYMPH: No palpable lymphadenopathy, in the neck axilla or supraclavicular areas. LUNGS: clear to auscultation , no crackles or wheezes HEART: regular rate & rhythm, no murmurs, no gallops, S1 normal and S2 normal  ABDOMEN: Right mild upper quadrant abdomen  Tenderness.Abdomen soft, non-tender, normal bowel sounds, no masses or organomegaly and no hepatosplenomegaly palpable. EXTREMITIES: No edema, no skin discoloration or tenderness NEURO: alert & oriented , no focal motor/sensory deficits.     LABORATORY DATA: Lab Results  Component Value Date   WBC 7.5 03/15/2013   HGB 12.2 03/15/2013   HCT 37.4 03/15/2013   MCV 89.7 03/15/2013   PLT 293 03/15/2013      Chemistry      Component Value Date/Time   NA 137 03/15/2013 1419   K 3.9 03/15/2013 1419   CL 101 03/15/2013 1419   CO2 27 03/15/2013 1419   BUN 11 03/15/2013 1419   CREATININE 0.99 03/15/2013 1419      Component Value Date/Time   CALCIUM 9.1 03/15/2013 1419   ALKPHOS 140* 03/15/2013 1419   AST 24 03/15/2013 1419   ALT 16 03/15/2013 1419   BILITOT 0.3 03/15/2013  1419       RADIOGRAPHIC STUDIES: Mr Abdomen Wo Contrast  03-Apr-2013   *RADIOLOGY REPORT*  Clinical Data: Evaluate renal lesions, pancreas and enlarged lymph nodes.  Abdominal pain.  MRI ABDOMEN WITHOUT CONTRAST  Technique:  Multiplanar multisequence MR imaging of the abdomen was performed. No intravenous contrast was administered.  Comparison: CT scan 01/29/2013.  Findings: As demonstrated on the CT scan there is a bilobed lesion in the pancreatic tail demonstrating slight  increased T2 signal intensity.  It measures approximately the 2.8 cm in length.  This is suspicious for a pancreatic neuroendocrine tumor.  There is also a 3 cm lesion located just anterior to the IVC and just inferior to the third portion the duodenum.  This could be a metastatic disease, adenopathy or paraganglioma.  Recommend clinical correlation with any laboratory data or physical examination findings may suggest a functional neuroendocrine tumor.  Endoscopic ultrasound biopsy of the pancreatic tail lesion may be indicated. Nuclear medicine Octreoscan would be another possibly helpful test.  There are small bilateral renal lesions.  There are too hemorrhagic cyst on the left.  The larger cyst on the left is bilobed or septated but I do not see any worrisome imaging features.  The liver is unremarkable.  No worrisome lesions.  Mild common bile duct dilatation status post cholecystectomy.  The pancreatic head body are normal.  The adrenal glands are normal.  The spleen is normal in size.  No focal lesions.  The aorta is normal in caliber.  No significant bony findings.  IMPRESSION:  1.  Elongated bilobed pancreatic tail lesion could be a pancreatic neuroendocrine tumor.  The 3 cm retroperitoneal mass could be related.  Recommend clinical correlation regarding the possibility of a functional no endocrine tumor.  Endoscopic biopsy of the pancreatic tail lesion may be indicated.  Octreoscan may also be helpful depending on the clinical  situation. 2.  Benign bilateral renal lesions.   Original Report Authenticated By: Rudie Meyer, M.D.   Nm Pet Image Initial (pi) Skull Base To Thigh  03/23/2013   *RADIOLOGY REPORT*  Clinical Data:  Initial treatment strategy for pancreatic mass. The patient also has a retroperitoneal lesions seen on recent MRI.  NUCLEAR MEDICINE PET WHOLE BODY  Fasting Blood Glucose:  100  Technique:  17.1 mCi F-18 FDG was injected intravenously. CT data was obtained and used for attenuation correction and anatomic localization only.  (This was not acquired as a diagnostic CT examination.) Additional exam technical data entered on technologist worksheet.  Comparison:  Abdominal MRI from 03/07/2013.  Abdomen and pelvis CT from 01/19/2013.  Findings:  Head/Neck:   No hypermetabolic lymph nodes in the neck.  Chest:   No hypermetabolic mediastinal or hilar nodes.  No suspicious pulmonary nodules on the CT scan.  Abdomen/Pelvis:  The 2.8 cm bilobed lesion in the pancreatic tail shows no hypermetabolic F D G accumulation on the PET scan today.  The 3 cm lesion which appears to be in the retroperitoneal space, just anterior to the IVC and inferior to the third segment the duodenum does show low level F D G uptake with SUV max = 4.  No other areas of unexpected or suspicious radiotracer accumulation in the abdomen or pelvis.  Skeleton:  No focal hypermetabolic activity to suggest skeletal metastasis.  Extremities:  No hypermetabolic activity to suggest metastasis.  IMPRESSION: 3.4   x   2.4  cm  lesion  just  anterior  to  the  IVC  is  mildly hypermetabolic and neoplasm remains a concern.  The  bilobed pancreatic tail lesion shows no evidence  for  F  D  G accumulation  above background soft tissue levels.   Low  grade  or well differentiated neoplasm can be poorly FDG avid.   Original Report Authenticated By: Kennith [facility], M.D.     ASSESSMENT:  1.  Patient has pancreatic tail mass which is metabolically inactive PET scan.  This  speaks  less of Adenocarcinoma and more of possible neuroendocrine tumore especially in the light of elevated CGA even though it can be high in patients taking PPI.  2.  3.4 x 2.4 abdominal mass just anterior to the IVC there was mildly hypermetabolic with SUV of 4.  Etiology of this mass lesion is unclear, and I feel that surgical evaluation for resecting the pancreatic tumor mass and possibly excisional biopsy of the mass anterior to the IVC would be very reasonable.  If she has carcinoid or other are well differentiated neuroendocrine tumor, surgery can be curative.  PLAN:  1. She was referred to pancreatic surgeon for resection of the pancreatic tail mass. Surgical pathology reports will be very helpful in deciding postsurgery surveillance/outcome or adjuvant treatment if indicated. 2. She'll return to clinic in 4 weeks.   3. CBC CMP at next visit. 4. I feel that endoscopic ultrasound unless absolutely needed to plan surgery could wait , since this is unlikely to change recommendation for surgical exploration and resection of these masses.    All questions were satisfactorily answered. Patient knows to call if  any concern arises.  I spent more than 50 % counseling the patient face to face. The total time spent in the appointment was 40 minutes.   Sherral Hammers, MD FACP. Hematology/Oncology.

## 2013-03-29 NOTE — Progress Notes (Signed)
Sores in mouth for 2 years.

## 2013-03-29 NOTE — Patient Instructions (Addendum)
[facility] Discharge Instructions  RECOMMENDATIONS MADE BY THE CONSULTANT AND ANY TEST RESULTS WILL BE SENT TO YOUR REFERRING PHYSICIAN.  EXAM FINDINGS BY THE PHYSICIAN TODAY AND SIGNS OR SYMPTOMS TO REPORT TO CLINIC OR PRIMARY PHYSICIAN:   We are unable to say that this is Cancer. However, it is suspicious. We need to send you to a surgeon as soon as possible regarding this mass. During surgery they will take a biopsy of the area and then we should know what this is. If this is a neuroendocrine cancer then we can potentially cure it with surgery. All of this is left to be seen. We will have to get a biopsy of the mass in the pancreas.   The surgery will take precedence over the endoscopy.   Return in 1 month to see MD.    Thank you for choosing [facility] to provide your oncology and hematology care.  To afford each patient quality time with our providers, please arrive at least 15 minutes before your scheduled appointment time.  With your help, our goal is to use those 15 minutes to complete the necessary work-up to ensure our physicians have the information they need to help with your evaluation and healthcare recommendations.    Effective January 1st, 2014, we ask that you re-schedule your appointment with our physicians should you arrive 10 or more minutes late for your appointment.  We strive to give you quality time with our providers, and arriving late affects you and other patients whose appointments are after yours.    Again, thank you for choosing [facility].  Our hope is that these requests will decrease the amount of time that you wait before being seen by our physicians.       _____________________________________________________________  Should you have questions after your visit to [facility], please contact our office at [phone] between the hours of 8:30 a.m. and 5:00 p.m.  Voicemails left after 4:30 p.m.  will not be returned until the following business day.  For prescription refill requests, have your pharmacy contact our office with your prescription refill request.

## 2013-04-02 ENCOUNTER — Other Ambulatory Visit (HOSPITAL_COMMUNITY): Payer: Self-pay | Admitting: Unknown Physician Specialty

## 2013-04-02 DIAGNOSIS — Z139 Encounter for screening, unspecified: Secondary | ICD-10-CM

## 2013-04-06 DIAGNOSIS — K8689 Other specified diseases of pancreas: Secondary | ICD-10-CM | POA: Insufficient documentation

## 2013-04-09 ENCOUNTER — Telehealth: Payer: Self-pay | Admitting: Gastroenterology

## 2013-04-09 ENCOUNTER — Ambulatory Visit (HOSPITAL_COMMUNITY)
Admission: RE | Admit: 2013-04-09 | Discharge: 2013-04-09 | Disposition: A | Discharge status: Home / Self Care | Payer: Medicare Other | Source: Ambulatory Visit | Attending: Unknown Physician Specialty | Admitting: Unknown Physician Specialty

## 2013-04-09 DIAGNOSIS — Z1231 Encounter for screening mammogram for malignant neoplasm of breast: Secondary | ICD-10-CM | POA: Insufficient documentation

## 2013-04-09 DIAGNOSIS — Z139 Encounter for screening, unspecified: Secondary | ICD-10-CM

## 2013-04-09 NOTE — Telephone Encounter (Signed)
rec'd records from [facility], [place] 8 pages to [employer]

## 2013-04-11 ENCOUNTER — Telehealth: Payer: Self-pay | Admitting: Internal Medicine

## 2013-04-11 ENCOUNTER — Telehealth: Payer: Self-pay

## 2013-04-11 ENCOUNTER — Other Ambulatory Visit: Payer: Self-pay | Admitting: Internal Medicine

## 2013-04-11 NOTE — Telephone Encounter (Signed)
I have tried to reach the pt to confirm that she wishes to cx her appt.  I have left messages with no response.  I called [facility] endo and they have not heard from the pt either, precert has nothing noted that the pt wanted to cx.  I called Dr Luvenia Starch office and made them aware of the situation.  Dr Christella Hartigan I left the pt on the schedule for now.

## 2013-04-11 NOTE — Telephone Encounter (Signed)
Patty called and stated that Pamela [facility]field called the office and left a voicemail message that she was cancelling her appointment with Dr. Christella Hartigan that was scheduled on Thursday July 31 and Patty has tried to reach her back by phone and has been unsuccessful

## 2013-04-11 NOTE — Telephone Encounter (Signed)
Message copied by Donata Duff on Wed Apr 11, 2013  2:48 PM ------      Message from: Ruffin Pyo D      Created: Wed Apr 11, 2013  2:15 PM       Pt called stating that her procedure at the hospital tomorrow should be cancelled. ------

## 2013-04-11 NOTE — Telephone Encounter (Signed)
Left message on machine to call back  

## 2013-04-12 ENCOUNTER — Encounter (HOSPITAL_COMMUNITY): Payer: Self-pay | Admitting: Anesthesiology

## 2013-04-12 ENCOUNTER — Ambulatory Visit (HOSPITAL_COMMUNITY): Admission: RE | Admit: 2013-04-12 | Payer: Medicare Other | Source: Ambulatory Visit | Admitting: Gastroenterology

## 2013-04-12 HISTORY — DX: Sleep apnea, unspecified: G47.30

## 2013-04-12 HISTORY — DX: Cardiac arrhythmia, unspecified: I49.9

## 2013-04-12 SURGERY — UPPER ENDOSCOPIC ULTRASOUND (EUS) LINEAR
Anesthesia: Monitor Anesthesia Care

## 2013-04-12 NOTE — Telephone Encounter (Signed)
ok 

## 2013-04-12 NOTE — Telephone Encounter (Signed)
Can we find out why?

## 2013-04-12 NOTE — Telephone Encounter (Signed)
Noted  

## 2013-04-12 NOTE — Telephone Encounter (Signed)
We are trying to find that out but shes not returning our calls nor answering her phone

## 2013-04-12 NOTE — Anesthesia Preprocedure Evaluation (Deleted)
Anesthesia Evaluation  Patient identified by MRN, date of birth, ID band Patient awake    Reviewed: Allergy & Precautions, H&P , NPO status , Patient's Chart, lab work & pertinent test results, reviewed documented beta blocker date and time   Airway Mallampati: II TM Distance: >3 FB Neck ROM: full    Dental no notable dental hx. (+) Teeth Intact and Dental Advisory Given   Pulmonary asthma , sleep apnea ,  Mild exercise induced asthma. breath sounds clear to auscultation  Pulmonary exam normal       Cardiovascular Exercise Tolerance: Good hypertension, Pt. on home beta blockers and Pt. on medications + dysrhythmias Rhythm:regular Rate:Normal     Neuro/Psych negative neurological ROS  negative psych ROS   GI/Hepatic Neg liver ROS, GERD-  Medicated and Controlled,  Endo/Other  diabetes, Type 2Diet diabetes  Renal/GU negative Renal ROS     Musculoskeletal  (+) Fibromyalgia -  Abdominal   Peds  Hematology negative hematology ROS (+)   Anesthesia Other Findings   Reproductive/Obstetrics negative OB ROS                           Anesthesia Physical  Anesthesia Plan  ASA: II  Anesthesia Plan: MAC   Post-op Pain Management:    Induction: Intravenous  Airway Management Planned: Simple Face Mask  Additional Equipment:   Intra-op Plan:   Post-operative Plan:   Informed Consent: I have reviewed the patients History and Physical, chart, labs and discussed the procedure including the risks, benefits and alternatives for the proposed anesthesia with the patient or authorized representative who has indicated his/her understanding and acceptance.   Dental advisory given  Plan Discussed with: CRNA  Anesthesia Plan Comments:         Anesthesia Quick Evaluation

## 2013-04-20 ENCOUNTER — Telehealth: Payer: Self-pay | Admitting: Gastroenterology

## 2013-04-20 NOTE — Telephone Encounter (Signed)
rec'd records from [facility], [place] 7 page's to [employer]

## 2013-04-26 ENCOUNTER — Encounter (HOSPITAL_COMMUNITY): Payer: Self-pay

## 2013-04-26 ENCOUNTER — Encounter (HOSPITAL_COMMUNITY): Payer: Medicare Other | Attending: Internal Medicine

## 2013-04-26 ENCOUNTER — Encounter (HOSPITAL_COMMUNITY): Payer: Medicare Other

## 2013-04-26 VITALS — BP 124/87 | HR 68 | Temp 98.2°F | Resp 16 | Wt 169.5 lb

## 2013-04-26 DIAGNOSIS — D3A8 Other benign neuroendocrine tumors: Secondary | ICD-10-CM

## 2013-04-26 DIAGNOSIS — R1900 Intra-abdominal and pelvic swelling, mass and lump, unspecified site: Secondary | ICD-10-CM | POA: Insufficient documentation

## 2013-04-26 DIAGNOSIS — R1909 Other intra-abdominal and pelvic swelling, mass and lump: Secondary | ICD-10-CM

## 2013-04-26 DIAGNOSIS — C7A098 Malignant carcinoid tumors of other sites: Secondary | ICD-10-CM

## 2013-04-26 DIAGNOSIS — E119 Type 2 diabetes mellitus without complications: Secondary | ICD-10-CM

## 2013-04-26 DIAGNOSIS — R634 Abnormal weight loss: Secondary | ICD-10-CM

## 2013-04-26 DIAGNOSIS — K869 Disease of pancreas, unspecified: Secondary | ICD-10-CM

## 2013-04-26 DIAGNOSIS — D3A098 Benign carcinoid tumors of other sites: Secondary | ICD-10-CM

## 2013-04-26 DIAGNOSIS — D3A00 Benign carcinoid tumor of unspecified site: Secondary | ICD-10-CM | POA: Insufficient documentation

## 2013-04-26 LAB — COMPREHENSIVE METABOLIC PANEL
ALT: 19 U/L (ref 0–35)
AST: 29 U/L (ref 0–37)
Albumin: 3.6 g/dL (ref 3.5–5.2)
Alkaline Phosphatase: 134 U/L — ABNORMAL HIGH (ref 39–117)
BUN: 6 mg/dL (ref 6–23)
CO2: 27 mEq/L (ref 19–32)
Calcium: 9.3 mg/dL (ref 8.4–10.5)
Chloride: 100 mEq/L (ref 96–112)
Creatinine, Ser: 0.84 mg/dL (ref 0.50–1.10)
GFR calc Af Amer: 84 mL/min — ABNORMAL LOW (ref >90)
GFR calc non Af Amer: 72 mL/min — ABNORMAL LOW (ref >90)
Glucose, Bld: 103 mg/dL — ABNORMAL HIGH (ref 70–99)
Potassium: 4.1 mEq/L (ref 3.5–5.1)
Sodium: 137 mEq/L (ref 135–145)
Total Bilirubin: 0.4 mg/dL (ref 0.3–1.2)
Total Protein: 7.8 g/dL (ref 6.0–8.3)

## 2013-04-26 LAB — CBC WITH DIFFERENTIAL/PLATELET
Basophils Absolute: 0.1 10*3/uL (ref 0.0–0.1)
Basophils Relative: 2 % — ABNORMAL HIGH (ref 0–1)
Eosinophils Absolute: 0.4 10*3/uL (ref 0.0–0.7)
Eosinophils Relative: 5 % (ref 0–5)
HCT: 38.4 % (ref 36.0–46.0)
Hemoglobin: 12.4 g/dL (ref 12.0–15.0)
Lymphocytes Relative: 30 % (ref 12–46)
Lymphs Abs: 2 10*3/uL (ref 0.7–4.0)
MCH: 29.3 pg (ref 26.0–34.0)
MCHC: 32.3 g/dL (ref 30.0–36.0)
MCV: 90.8 fL (ref 78.0–100.0)
Monocytes Absolute: 0.7 10*3/uL (ref 0.1–1.0)
Monocytes Relative: 10 % (ref 3–12)
Neutro Abs: 3.7 10*3/uL (ref 1.7–7.7)
Neutrophils Relative %: 54 % (ref 43–77)
Platelets: 314 10*3/uL (ref 150–400)
RBC: 4.23 MIL/uL (ref 3.87–5.11)
RDW: 13 % (ref 11.5–15.5)
WBC: 6.9 10*3/uL (ref 4.0–10.5)

## 2013-04-26 NOTE — Patient Instructions (Addendum)
[facility] Discharge Instructions  RECOMMENDATIONS MADE BY THE CONSULTANT AND ANY TEST RESULTS WILL BE SENT TO YOUR REFERRING PHYSICIAN.  EXAM FINDINGS BY THE PHYSICIAN TODAY AND SIGNS OR SYMPTOMS TO REPORT TO CLINIC OR PRIMARY PHYSICIAN: Discussion by Dr. Lorre Nick.   MEDICATIONS PRESCRIBED:  none  INSTRUCTIONS GIVEN AND DISCUSSED: Will follow-up as indicated after you are seen at [facility].  SPECIAL INSTRUCTIONS/FOLLOW-UP: Call us with any updates in your care.  Thank you for choosing [facility] to provide your oncology and hematology care.  To afford each patient quality time with our providers, please arrive at least 15 minutes before your scheduled appointment time.  With your help, our goal is to use those 15 minutes to complete the necessary work-up to ensure our physicians have the information they need to help with your evaluation and healthcare recommendations.    Effective January 1st, 2014, we ask that you re-schedule your appointment with our physicians should you arrive 10 or more minutes late for your appointment.  We strive to give you quality time with our providers, and arriving late affects you and other patients whose appointments are after yours.    Again, thank you for choosing [facility].  Our hope is that these requests will decrease the amount of time that you wait before being seen by our physicians.       _____________________________________________________________  Should you have questions after your visit to [facility], please contact our office at [phone] between the hours of 8:30 a.m. and 5:00 p.m.  Voicemails left after 4:30 p.m. will not be returned until the following business day.  For prescription refill requests, have your pharmacy contact our office with your prescription refill request.

## 2013-04-28 NOTE — Progress Notes (Signed)
[facility] OFFICE PROGRESS NOTE  PCP: [name], MD [place] [address]  DIAGNOSIS: No diagnosis found.PANCREATIC NEUROENDOCRINE TUMOR  CURRENT THERAPY:NONE  INTERVAL HISTORY: Pamela Hatfield 62 y.o. female returns for F/U. SINCE LAST VISIT HAS SEEN DR Isa Rankin AT [facility] HEPATOBILIARY SURGERY. HX TO DATE IS ESSENTIALLY AS FOLLOWS, HX OF RECURRING RIGHT ABDO PAIN, SOME DIARRHEA AND APPROX 20LB WT LOSS, GI SYMPTOMS FLUCTUATE, AND HAS HAD IRRITABLE BOWEL IN THE PAST, NO PALPITATION OR FLUSHING.. MULTIPLE XRAYS, PET/CT, ABDO MRI, AND OCTREOTIDE SCAN.FINDING OF MASS TAIL OF PANCREAS WHICH IS NOT PET AVID OR OCTREOTIDE AVID, MASS IN CENTRAL ABDOMEN/SMALL BOWEL MESENTARY SLIGHTLY PET AVID AND OCTREOTIDE AVID. CONSISTENT WITH CARCINOID.NOT ABLE TO BX DUE TO PROXIMITY OF IVC. PANCREATIC TAIL MASS NOW FELT TO BE LIKELY A SPLENULE. HX OF GASTRIC MUCOSAL LESION LIKELY BENIGN WILL NEED F/U. 24 HRS HIAA MINIMALLY ELEVATED AT 8.1, UPPER LEVEL OF NORMAL 6.0. CHROMOGRANNINA A REPORTEDLY 80 BUT HARD COPY DATA NOT AVAILABLE, THEN REPEATED AT [facility] WAS LOW AT 3.5 . NO ACUTE COMPLAINTS, HAS CHRONIC PAIN FROM FIBROMYALGIA, RIGH SIDED ABDO PAIN PERSISTS, WAX AND WANE, DIARRHEA BETTER. MOST RECENT, CT ABDOMEN AND PELVIS SHOWS TUMOR IN DISTAL ILLEUM  MEDICAL HISTORY: Past Medical History  Diagnosis Date  . GERD (gastroesophageal reflux disease)   . Weight loss 02/25/2011  . Hypertension   . Asthma   . Hyperlipidemia   . Sleep apnea     no cpap used, pt does not like  . Diabetes mellitus     diet controlled  . Fibromyalgia   . Myalgia and myositis, unspecified   . Dysrhythmia     hx rapid heart beat  . Accessory spleen     SURGICAL HISTORY:  Past Surgical History  Procedure Laterality Date  . Rotator cuff repair  yrs ago    left  . Nose surgery  yrs ago  . Breast lumpectomy      both breast  . Esophageal dilation  06/14/2012    Procedure: ESOPHAGEAL DILATION;  Surgeon: Corbin Ade,  MD;  [facility];  Service: Endoscopy;;  . Eus  07/13/2012    Procedure: UPPER ENDOSCOPIC ULTRASOUND (EUS) LINEAR;  Surgeon: Rachael Fee, MD;  Location: [facility];  Service: Endoscopy;  Laterality: N/A;  . Back surgery  yrs ago    lower back  . Abdominal hysterectomy  yrs ago    ovaries remain, done because of endometriosis  . Cholecystectomy  yrs ago      PROBLEM LIST : has Family history of colon cancer; Weight loss; Abdominal pain; GERD (gastroesophageal reflux disease); Dysphagia; Nonspecific (abnormal) findings on radiological and other examination of gastrointestinal tract; Mixed hyperlipidemia; Myalgia and myositis, unspecified; and HTN (hypertension) on her problem list.    ALLERGIES:  is allergic to clarithromycin; codeine; sulfa antibiotics; and tape.  MEDICATIONS: Current outpatient prescriptions:ALPRAZolam (XANAX) 1 MG tablet, Take 0.5-1 mg by mouth daily. , Disp: , Rfl: ;  Biotin (BIOTIN 5000) 5 MG CAPS, Take 1 capsule by mouth daily., Disp: , Rfl: ;  cetirizine (ZYRTEC) 10 MG tablet, Take 10 mg by mouth daily., Disp: , Rfl: ;  enalapril (VASOTEC) 10 MG tablet, Take 10 mg by mouth every morning. , Disp: , Rfl: ;  FLUoxetine (PROZAC) 20 MG capsule, Take 20 mg by mouth every morning., Disp: , Rfl:  gabapentin (NEURONTIN) 600 MG tablet, Take 600 mg by mouth 3 (three) times daily. , Disp: , Rfl: ;  meloxicam (MOBIC) 15 MG tablet,  Take 15 mg by mouth daily., Disp: , Rfl: ;  methocarbamol (ROBAXIN) 750 MG tablet, Take 750-1,500 mg by mouth as needed. , Disp: , Rfl: ;  metoprolol (LOPRESSOR) 50 MG tablet, Take 50 mg by mouth 2 (two) times daily., Disp: , Rfl:  Multiple Vitamin (MULTIVITAMIN WITH MINERALS) TABS, Take 1 tablet by mouth daily., Disp: , Rfl: ;  pantoprazole (PROTONIX) 40 MG tablet, Take 40 mg by mouth daily., Disp: , Rfl: ;  pregabalin (LYRICA) 50 MG capsule, 50 mg as needed. Take 50 mg by mouth 2 times daily., Disp: , Rfl: ;  traMADol (ULTRAM) 50 MG tablet, Take  100 mg by mouth 3 (three) times daily. Pain., Disp: , Rfl:   FH ADDITIONAL POS FOR COLON FATHER AND MULTIPLE AUNTS,  AND OVARIAN CA MOTHER, MATERNAL AUNT BREAST AND OVARIAN  REVIEW OF SYSTEMS:  NO HEADACHE DIZZINESS CHILLS FEVER COUGH SOB EDEMA RASH   PHYSICAL EXAMINATION: ECOG PERFORMANCE STATUS: 1 - Symptomatic but completely ambulatory  Filed Vitals:   04/26/13 1200  BP: 124/87  Pulse: 68  Temp: 98.2 F (36.8 C)  Resp: 16    GENERAL: No distress, well nourished.  SKIN:  No rashes or bruising  HEAD: Normocephalic, No trauma EYES: Sclera and conjuntiva clear  ENT: No thrush LYMPH: No palpable lymphadenopathy neck, supraclavicular submandibular axilla BREAST:NoT EXAMINED LUNGS: Clear to auscultation, no crackles or wheezes or rhonchi HEART: Regular rate & rhythm,   ABDOMEN: Abdomen soft, MILD RUQ TENDER NO GUARDING OR REBOUND, , no masses or organomegaly   MSK: No deformity, no tenderness over spine, no acutely hot or inflammed joints EXTREMITIES: No lower ext edema NEURO: Alert & oriented, no gross focal weakness. PSYCH  Cooperative, mood/affect normal   LABORATORY DATA: CBC AND MET PANEL OK SEE REPORT   RADIOGRAPHIC STUDIES: SEE MULTIPLE REPORTS INCLUDNG [facility] SCANS   ASSESSMENT: SEE CURRENT ILLNESS. 1. MASS SMALL BOWEL MESENTARY, MASS DISTAL ILLEUM, LIKELY CARCINOID, AS PER CT AND MRI APPEARANCE AND UPTAKE ON OCTREOTIDE SCAN. MASS TAIL OF PANCREAS LIKELY SPLENULE, POSSIBLE MALIGNANCY. NOTE SURGERY CONSULT ORIGINALLY DID NOT RECOMMEND SURGERY, BUT CT SHOWING ILLEAL MASS HAS BEEN DONE SINCE  INITIALL NO BX RECOMMENDED BUT NOW DISTAL ILLEUM MASS FOUND. I THINK WITH NEW FINDING SURGERY IS INDICATED. MAY NEED ADDITIONAL SMALL BOWEL F/U VIEWS, LIKELY POOR CANDIDATE FOR CAPSULE WITH RISK OF OBSTRUCTION,  NOTE HX OF COLONOSCOPY LAST YR, AND EUS TO EVALUATE GASTRIC /BENIGN APPEARING LESION IN 2013. CHROMOGRANIN REPORTED HIGH? THEN LOW, 24 HRS HIAA MINIMAL ELEVATED  2. STRONGH FH  SUGGESTING GENETIC BASED CANCER RISK FOR BREAST OVARY COLON   PLAN: 1. HAS APPT WITH DR Sunny Schlein GI ONCOLOGY AT [facility] WOULD DISCUSS HIS OPINION, I CURRENTLY THINK SURGERY FOR DISTAL ILLEUM LESION INDICATED, WITH RISK OF OBSTRUCTION, REMOVE IF POSSIBLE LESION NEAR IVC, POSSIBLY SMALL BOWEL FOLLOW THROUGH BEFORE SURGERY. LIKELY DOES NOT NEED ANOTHER COLONOSCOPY NOW  2. NEED TO RECONSULT DR Sherlynn Stalls .3. GENETIC COUNSELLING CONSULT, IF GENETIC EVALUATION UNAVAILABLE IN A TIMELY FASHION, FAVOR REMOVE OVARIES AT TIME OF SURGERY. 4. I WOULD NOT GIVE SANDOSTATIN AT THIS TIME    All questions were answered. The patient knows to call the clinic with any problems, questions or concerns. We can certainly see the patient much sooner if necessary.     Marin Roberts, MD 04/28/2013 8:46 PM

## 2013-04-29 DIAGNOSIS — D3A098 Benign carcinoid tumors of other sites: Secondary | ICD-10-CM | POA: Insufficient documentation

## 2013-04-29 DIAGNOSIS — C7A098 Malignant carcinoid tumors of other sites: Secondary | ICD-10-CM | POA: Insufficient documentation

## 2013-05-04 ENCOUNTER — Telehealth (HOSPITAL_COMMUNITY): Payer: Self-pay

## 2013-05-04 NOTE — Telephone Encounter (Signed)
error 

## 2013-05-08 DIAGNOSIS — K6389 Other specified diseases of intestine: Secondary | ICD-10-CM | POA: Insufficient documentation

## 2013-06-26 ENCOUNTER — Telehealth (HOSPITAL_COMMUNITY): Payer: Self-pay

## 2013-07-05 ENCOUNTER — Ambulatory Visit (INDEPENDENT_AMBULATORY_CARE_PROVIDER_SITE_OTHER): Payer: Medicare Other | Admitting: Otolaryngology

## 2013-07-05 ENCOUNTER — Telehealth (HOSPITAL_COMMUNITY): Payer: Self-pay

## 2013-07-05 DIAGNOSIS — J330 Polyp of nasal cavity: Secondary | ICD-10-CM

## 2013-07-05 DIAGNOSIS — J310 Chronic rhinitis: Secondary | ICD-10-CM

## 2013-07-05 NOTE — Telephone Encounter (Signed)
Call to patient for follow-up after office visit in August to determine if she was still being seen at [facility].  Per patient she is still being seen at [facility] and thinks that she is receiving sandostatin injections there.  States "I may see if I can come back to [facility] because it's expensive to be driving back and forth to [facility]".  Instructed to call and make an appointment if she makes that decision.

## 2013-07-19 ENCOUNTER — Other Ambulatory Visit: Payer: Self-pay

## 2013-12-20 ENCOUNTER — Ambulatory Visit (INDEPENDENT_AMBULATORY_CARE_PROVIDER_SITE_OTHER): Payer: Medicare Other | Admitting: Otolaryngology

## 2013-12-20 DIAGNOSIS — J310 Chronic rhinitis: Secondary | ICD-10-CM

## 2013-12-20 DIAGNOSIS — J343 Hypertrophy of nasal turbinates: Secondary | ICD-10-CM

## 2014-03-20 ENCOUNTER — Other Ambulatory Visit: Payer: Self-pay | Admitting: Oral Surgery

## 2014-04-29 ENCOUNTER — Other Ambulatory Visit (HOSPITAL_COMMUNITY): Payer: Self-pay | Admitting: Unknown Physician Specialty

## 2014-04-29 DIAGNOSIS — Z1231 Encounter for screening mammogram for malignant neoplasm of breast: Secondary | ICD-10-CM

## 2014-05-02 ENCOUNTER — Ambulatory Visit (HOSPITAL_COMMUNITY)
Admission: RE | Admit: 2014-05-02 | Discharge: 2014-05-02 | Disposition: A | Discharge status: Home / Self Care | Payer: Medicare Other | Source: Ambulatory Visit | Attending: Unknown Physician Specialty | Admitting: Unknown Physician Specialty

## 2014-05-02 DIAGNOSIS — Z1231 Encounter for screening mammogram for malignant neoplasm of breast: Secondary | ICD-10-CM

## 2014-06-08 ENCOUNTER — Emergency Department (HOSPITAL_COMMUNITY)
Admission: EM | Admit: 2014-06-08 | Discharge: 2014-06-08 | Disposition: A | Discharge status: Home / Self Care | Payer: Medicare Other | Attending: Emergency Medicine | Admitting: Emergency Medicine

## 2014-06-08 ENCOUNTER — Encounter (HOSPITAL_COMMUNITY): Payer: Self-pay | Admitting: Emergency Medicine

## 2014-06-08 DIAGNOSIS — S61209A Unspecified open wound of unspecified finger without damage to nail, initial encounter: Secondary | ICD-10-CM | POA: Insufficient documentation

## 2014-06-08 DIAGNOSIS — E119 Type 2 diabetes mellitus without complications: Secondary | ICD-10-CM | POA: Diagnosis not present

## 2014-06-08 DIAGNOSIS — Q8909 Congenital malformations of spleen: Secondary | ICD-10-CM | POA: Insufficient documentation

## 2014-06-08 DIAGNOSIS — Z87891 Personal history of nicotine dependence: Secondary | ICD-10-CM | POA: Insufficient documentation

## 2014-06-08 DIAGNOSIS — Y92009 Unspecified place in unspecified non-institutional (private) residence as the place of occurrence of the external cause: Secondary | ICD-10-CM | POA: Insufficient documentation

## 2014-06-08 DIAGNOSIS — K219 Gastro-esophageal reflux disease without esophagitis: Secondary | ICD-10-CM | POA: Diagnosis not present

## 2014-06-08 DIAGNOSIS — W261XXA Contact with sword or dagger, initial encounter: Secondary | ICD-10-CM

## 2014-06-08 DIAGNOSIS — I10 Essential (primary) hypertension: Secondary | ICD-10-CM | POA: Insufficient documentation

## 2014-06-08 DIAGNOSIS — Z79899 Other long term (current) drug therapy: Secondary | ICD-10-CM | POA: Diagnosis not present

## 2014-06-08 DIAGNOSIS — W260XXA Contact with knife, initial encounter: Secondary | ICD-10-CM | POA: Diagnosis not present

## 2014-06-08 DIAGNOSIS — Y93G9 Activity, other involving cooking and grilling: Secondary | ICD-10-CM | POA: Diagnosis not present

## 2014-06-08 DIAGNOSIS — Z8509 Personal history of malignant neoplasm of other digestive organs: Secondary | ICD-10-CM | POA: Insufficient documentation

## 2014-06-08 DIAGNOSIS — J45909 Unspecified asthma, uncomplicated: Secondary | ICD-10-CM | POA: Diagnosis not present

## 2014-06-08 DIAGNOSIS — S61219A Laceration without foreign body of unspecified finger without damage to nail, initial encounter: Secondary | ICD-10-CM

## 2014-06-08 HISTORY — DX: Malignant (primary) neoplasm, unspecified: C80.1

## 2014-06-08 MED ORDER — TETANUS-DIPHTH-ACELL PERTUSSIS 5-2.5-18.5 LF-MCG/0.5 IM SUSP
0.5000 mL | Freq: Once | INTRAMUSCULAR | Status: AC
  Administered 2014-06-08: 0.5 mL via INTRAMUSCULAR
  Filled 2014-06-08: qty 0.5

## 2014-06-08 MED ORDER — LIDOCAINE HCL (PF) 1 % IJ SOLN
5.0000 mL | Freq: Once | INTRAMUSCULAR | Status: DC
  Filled 2014-06-08: qty 5

## 2014-06-08 NOTE — Discharge Instructions (Signed)

## 2014-06-08 NOTE — ED Notes (Signed)
Pt was in the kitchen chopping potatoes and cut her left ring finger. Bleeding is controlled. Pt states she is on NSAIDs but not blood thinners.

## 2014-06-08 NOTE — ED Provider Notes (Signed)
CSN: [id number]     Arrival date & time 06/08/14  1647 History   First MD Initiated Contact with Patient 06/08/14 1700     Chief Complaint  Patient presents with  . Laceration     (Consider location/radiation/quality/duration/timing/severity/associated sxs/prior Treatment) HPI  Pamela Hatfield is a 63 y.o. female who presents to the Emergency Department complaining of laceration to the tip of her left ring finger.  States the laceration occurred while cutting potatoes with a knife. She states that she has been unable to control the bleeding at home. She denies taking any anticoagulant medications but does report taking anti-inflammatories. She denies swelling, numbness, or inability to move the finger.  Patient is unsure of her last tetanus.    Past Medical History  Diagnosis Date  . GERD (gastroesophageal reflux disease)   . Weight loss 02/25/2011  . Hypertension   . Asthma   . Hyperlipidemia   . Sleep apnea     no cpap used, pt does not like  . Diabetes mellitus     diet controlled  . Fibromyalgia   . Myalgia and myositis, unspecified   . Dysrhythmia     hx rapid heart beat  . Accessory spleen   . Cancer     located on her aorta and small intestines   Past Surgical History  Procedure Laterality Date  . Rotator cuff repair  yrs ago    left  . Nose surgery  yrs ago  . Breast lumpectomy      both breast  . Esophageal dilation  06/14/2012    Procedure: ESOPHAGEAL DILATION;  Surgeon: Daneil Dolin, MD;  [facility];  Service: Endoscopy;;  . Eus  07/13/2012    Procedure: UPPER ENDOSCOPIC ULTRASOUND (EUS) LINEAR;  Surgeon: Milus Banister, MD;  Location[facility];  Service: Endoscopy;  Laterality: N/A;  . Back surgery  yrs ago    lower back  . Abdominal hysterectomy  yrs ago    ovaries remain, done because of endometriosis  . Cholecystectomy  yrs ago   Family History  Problem Relation Age of Onset  . Colon cancer Father   . Colon cancer Maternal Aunt    . Colon cancer Maternal Uncle    History  Substance Use Topics  . Smoking status: Former Smoker -- 0.50 packs/day for 20 years    Types: Cigarettes    Quit date: 09/13/1992  . Smokeless tobacco: Never Used     Comment: quit about 25 + years ago  . Alcohol Use: No   OB History   Grav Para Term Preterm Abortions TAB SAB Ect Mult Living                 Review of Systems  Constitutional: Negative for fever and chills.  Musculoskeletal: Negative for arthralgias, back pain and joint swelling.  Skin: Positive for wound.       Laceration left ring finger  Neurological: Negative for dizziness, weakness and numbness.  Hematological: Does not bruise/bleed easily.  All other systems reviewed and are negative.     Allergies  Codeine; Sulfa antibiotics; Tape; and Clarithromycin  Home Medications   Prior to Admission medications   Medication Sig Start Date End Date Taking? Authorizing Provider  enalapril (VASOTEC) 10 MG tablet Take 10 mg by mouth every morning.    Yes Historical Provider, MD  FLUoxetine (PROZAC) 40 MG capsule Take 40 mg by mouth daily.   Yes Historical Provider, MD  gabapentin (NEURONTIN) 600 MG  tablet Take 600 mg by mouth 3 (three) times daily.    Yes Historical Provider, MD  HYDROcodone-acetaminophen (NORCO) 7.5-325 MG per tablet Take 1 tablet by mouth every 6 (six) hours as needed for moderate pain.   Yes Historical Provider, MD  metoprolol (LOPRESSOR) 50 MG tablet Take 50 mg by mouth 2 (two) times daily.   Yes Historical Provider, MD  Multiple Vitamin (MULTIVITAMIN WITH MINERALS) TABS Take 1 tablet by mouth daily.   Yes Historical Provider, MD  Octreotide Acetate (SANDOSTATIN IJ) Inject as directed every 30 (thirty) days.   Yes Historical Provider, MD  pantoprazole (PROTONIX) 40 MG tablet Take 40 mg by mouth daily.   Yes Historical Provider, MD  traMADol (ULTRAM) 50 MG tablet Take 100 mg by mouth 3 (three) times daily as needed for moderate pain. Pain.   Yes  Historical Provider, MD   BP 120/66  Pulse 81  Temp(Src) 98.9 F (37.2 C) (Oral)  Resp 16  Ht 5\' 5"  (1.651 m)  Wt 168 lb (76.204 kg)  BMI 27.96 kg/m2  SpO2 100% Physical Exam  Nursing note and vitals reviewed. Constitutional: She is oriented to person, place, and time. She appears well-developed and well-nourished. No distress.  HENT:  Head: Normocephalic and atraumatic.  Cardiovascular: Normal rate, regular rhythm, normal heart sounds and intact distal pulses.   No murmur heard. Pulmonary/Chest: Effort normal and breath sounds normal. No respiratory distress.  Musculoskeletal: She exhibits no edema and no tenderness.  Neurological: She is alert and oriented to person, place, and time. She exhibits normal muscle tone. Coordination normal.  Skin: Skin is warm. Laceration noted.  1 cm superficial  Laceration to the distal tip of the left fourth finger. Nail appears intact. Distal sensation intact. Patient has full range of motion of the finger. Bleeding is controlled after applying pressure.    ED Course  Procedures (including critical care time) Labs Review Labs Reviewed - No data to display  Imaging Review No results found.   EKG Interpretation None      MDM   Final diagnoses:  Laceration of finger, initial encounter    LACERATION REPAIR Performed by: Makana Feigel L. Authorized by: Hale Bogus Consent: Verbal consent obtained. Risks and benefits: risks, benefits and alternatives were discussed Consent given by: patient Patient identity confirmed: provided demographic data Prepped and Draped in normal sterile fashion Wound explored  Laceration Location: distal tip of the left fourth finger  Laceration Length: 1 cm  No Foreign Bodies seen or palpated  Anesthesia: local infiltration  Local anesthetic: lidocaine 1% w/o epinephrine  Anesthetic total: 0.5 ml  Irrigation method: syringe Amount of cleaning: standard  Skin closure: 4-0  prolene Number of sutures: 2  Technique: simple interrupted  Patient tolerance: Patient tolerated the procedure well with no immediate complications.    NV intact.  Bleeding controlled.  Wound bandaged.  Td updated.  Pt agrees to wound care instructions, sutures out in 10 days and to return here for any signs of infection.  Pt stable for d/c   Taya Ashbaugh L. Vanessa [place], PA-C 06/09/14 1838

## 2014-06-08 NOTE — ED Notes (Signed)
Patient with no complaints at this time. Respirations even and unlabored. Skin warm/dry. Discharge instructions reviewed with patient at this time. Patient given opportunity to voice concerns/ask questions. Patient discharged at this time and left Emergency Department with steady gait.   

## 2014-06-09 NOTE — ED Provider Notes (Signed)
Medical screening examination/treatment/procedure(s) were performed by non-physician practitioner and as supervising physician I was immediately available for consultation/collaboration.   EKG Interpretation None       Nat Christen, MD 06/09/14 2355

## 2014-06-27 ENCOUNTER — Ambulatory Visit (INDEPENDENT_AMBULATORY_CARE_PROVIDER_SITE_OTHER): Payer: Medicare Other | Admitting: Otolaryngology

## 2014-06-28 ENCOUNTER — Other Ambulatory Visit: Payer: Self-pay

## 2014-10-22 ENCOUNTER — Ambulatory Visit (HOSPITAL_COMMUNITY)
Admission: RE | Admit: 2014-10-22 | Discharge: 2014-10-22 | Disposition: A | Discharge status: Home / Self Care | Payer: PPO | Source: Ambulatory Visit | Attending: Internal Medicine | Admitting: Internal Medicine

## 2014-10-22 DIAGNOSIS — M545 Low back pain: Secondary | ICD-10-CM | POA: Insufficient documentation

## 2014-10-22 DIAGNOSIS — M5441 Lumbago with sciatica, right side: Secondary | ICD-10-CM

## 2014-10-22 DIAGNOSIS — R52 Pain, unspecified: Secondary | ICD-10-CM

## 2014-10-22 DIAGNOSIS — R200 Anesthesia of skin: Secondary | ICD-10-CM | POA: Diagnosis not present

## 2014-10-22 DIAGNOSIS — M6281 Muscle weakness (generalized): Secondary | ICD-10-CM | POA: Diagnosis not present

## 2014-10-22 DIAGNOSIS — R262 Difficulty in walking, not elsewhere classified: Secondary | ICD-10-CM

## 2014-10-22 DIAGNOSIS — M542 Cervicalgia: Secondary | ICD-10-CM | POA: Diagnosis not present

## 2014-10-22 DIAGNOSIS — M5442 Lumbago with sciatica, left side: Secondary | ICD-10-CM

## 2014-10-22 DIAGNOSIS — M5385 Other specified dorsopathies, thoracolumbar region: Secondary | ICD-10-CM | POA: Insufficient documentation

## 2014-10-22 DIAGNOSIS — M5384 Other specified dorsopathies, thoracic region: Secondary | ICD-10-CM

## 2014-10-22 DIAGNOSIS — R29898 Other symptoms and signs involving the musculoskeletal system: Secondary | ICD-10-CM

## 2014-10-22 NOTE — Therapy (Addendum)
[place], [place], [address] Phone: [phone]   Fax:  [phone]  Physical Therapy Evaluation  Patient Details  Name: Pamela Hatfield MRN: [id number] Date of Birth: 1951-03-18 Referring Provider:  Delphina Cahill, MD  Encounter Date: 10/22/2014      PT End of Session - 10/22/14 1900    Visit Number 1   Number of Visits 16   Date for PT Re-Evaluation 11/21/14   Authorization Type [employer] team advantage   Authorization - Visit Number 1   Authorization - Number of Visits 16   PT Start Time 3875   PT Stop Time 1603   PT Time Calculation (min) 48 min   Activity Tolerance Patient tolerated treatment well   Behavior During Therapy [facility] for tasks assessed/performed      Past Medical History  Diagnosis Date  . GERD (gastroesophageal reflux disease)   . Weight loss 02/25/2011  . Hypertension   . Asthma   . Hyperlipidemia   . Sleep apnea     no cpap used, pt does not like  . Diabetes mellitus     diet controlled  . Fibromyalgia   . Myalgia and myositis, unspecified   . Dysrhythmia     hx rapid heart beat  . Accessory spleen   . Cancer     located on her aorta and small intestines    Past Surgical History  Procedure Laterality Date  . Rotator cuff repair  yrs ago    left  . Nose surgery  yrs ago  . Breast lumpectomy      both breast  . Esophageal dilation  06/14/2012    Procedure: ESOPHAGEAL DILATION;  Surgeon: Daneil Dolin, MD;  [facility];  Service: Endoscopy;;  . Eus  07/13/2012    Procedure: UPPER ENDOSCOPIC ULTRASOUND (EUS) LINEAR;  Surgeon: Milus Banister, MD;  [facility];  Service: Endoscopy;  Laterality: N/A;  . Back surgery  yrs ago    lower back  . Abdominal hysterectomy  yrs ago    ovaries remain, done because of endometriosis  . Cholecystectomy  yrs ago    There were no vitals taken for this visit.  Visit Diagnosis:  Bilateral low back pain with sciatica,  sciatica laterality unspecified  Cervicalgia  Thoracic spine dysfunction  Upper extremity weakness  Weakness of both lower extremities  Difficulty walking  Pain aggravated by sitting      Subjective Assessment - 10/22/14 1522    Symptoms Neck and low back pain. unable to step up into van, decreased balance due to LE and UE weakness.    Pertinent History Patien hass low back pain with radiculapthy symptoms of weakness in arms and legs both all the time and occur in the same intensity at the same time. Hand starts going to sleep with prolonged holding of a position. "I feel like i got a lot of nerve pain. Patient has cancer in intestines and aorta. no c/o head ache and dizziness, except occasional sncope that quickly resolves, Patient previously told she needed cervical spine surgery to improve neck pain as there is "limited spice betweeen her discs and spinal column.  numbness in legs began 3-4 months ago.    How long can you sit comfortably? < 4minutes   How long can you walk comfortably? 30 to 45 minutes up hill really agravates patient.    Patient Stated Goals to be bale to lift > 10lb from the  floor, to be bale to walk for an hour withotu stopping and to decrease pain.    Pain Score 7    Pain Location Back  and neck   Pain Orientation Medial;Left;Right;Posterior   Pain Descriptors / Indicators Aching;Numbness;Tingling   Pain Radiating Towards neck pain radiates into arms and increases with prolonged drivign, sitting and looking over shoulder raiseing arm up, Back pain hurts longer with prolonged sitting and radiates into bilateral hips.    Pain Onset More than a month ago   Pain Frequency Constant   Aggravating Factors  neck pain radiates into arms and increases with prolonged drivign, sitting and looking over shoulder raiseing arm up, Back pain hurts longer with prolonged sitting and radiates into bilateral hips.    Pain Relieving Factors pain medication, decreasign activity.     Effect of Pain on Daily Activities to be bale to ;          [facility] PT Assessment - 10/22/14 0001    Assessment   Medical Diagnosis UE and LE weakness and numbness    Onset Date 06/21/14   Next MD Visit Delphina Cahill   Prior Therapy no   Balance Screen   Has the patient fallen in the past 6 months Yes   How many times? 1   Has the patient had a decrease in activity level because of a fear of falling?  No   Is the patient reluctant to leave their home because of a fear of falling?  No   Prior Function   Level of Independence Independent with basic ADLs   Vocation On disability   Observation/Other Assessments   Focus on Therapeutic Outcomes (FOTO)  48% limited   Other:   Other/ Comments Gait: limited arch collapse, Rt hip elevated > Lt, Limited hip intetrnal rotation.    Other:   Other/Comments Rt hip anteriorly tilted > LT   AROM   Cervical Flexion 26   Cervical Extension 33   Cervical - Right Side Bend 19   Cervical - Left Side Bend 24   Cervical - Right Rotation 42   Cervical - Left Rotation 48   Lumbar Flexion 39   Lumbar Extension 29   Thoracic - Right Rotation 22   Thoracic - Left Rotation 28   Strength   Overall Strength Comments LE strength grossly 4-/5                  [facility] Adult PT Treatment/Exercise - 10/22/14 0001    Neck Exercises: Stretches   Other Neck Stretches 3D thoracic spine excursion 10x.                 PT Education - 10/22/14 1908    Education provided Yes   Education Details 3D thoracic spine excursions   Person(s) Educated Patient   Methods Explanation;Demonstration;Handout   Comprehension Verbalized understanding;Returned demonstration          PT Short Term Goals - 10/22/14 1908    PT SHORT TERM GOAL #1   Title Patient will display independence with HEP   Time 2   Period Weeks   Status New   PT SHORT TERM GOAL #2   Title Patient will display increasd hip internal rotation to WNL so patient can ambulate with toes  naturally in neutral position.    Baseline Patient ambualte with toes pointed out   Time 4   Period Weeks   Status New   PT SHORT TERM GOAL #3   Title [name]  will display increased Lumbar spine flexion to 60 degrees to be able to more easily bend forward and tie shoes.   Baseline 48 degrees of flexion   Time 4   Period Weeks   Status New   PT SHORT TERM GOAL #4   Title Patient will display increased  thoracic spine rotation od 40 degrees bilaterally to ambualte iwith increased arm swing during gait.    Baseline 22Rt and 28 Lt   Time 4   Period Weeks   Status New   PT SHORT TERM GOAL #5   Title Patient will displasy increased cervical spine rotation to 60 degrees bilaterally to more easily look over shoulder while driving.    Baseline 42 Rt and 48Lt   Time 4   Period Weeks   Status New           PT Long Term Goals - 10/22/14 1915    PT LONG TERM GOAL #1   Title Patient will state decreased symptoms to <50% while sitting >1 hour.    Time 8   Period Weeks   Status New   PT LONG TERM GOAL #2   Title [name] will display increased Lumbar spine flexion to 80 degrees to be able to more easily bend forward and tie shoes   Time 8   Period Weeks   Status New   PT LONG TERM GOAL #3   Title Patient will display increased  thoracic spine rotation to 65 degrees to more easily turn around to look over shoulder while sitting.    Time 8   Period Weeks   Status New   PT LONG TERM GOAL #4   Title Patient will displasy increased cervical spine rotation to 70 degrees bilaterally to more easily look over shoulder while driving   Time 8   Period Weeks   Status New   PT LONG TERM GOAL #5   Title Patient will display increased lumbar spine extension to 40 degrees to more easily look at the stars at night.    Time 8   Period Weeks   Status New               Plan - 10/22/14 1901    Clinical Impression Statement Patient displays UE and LE weakness resulting in difficulty  walking and performing prolonged sitting secondary to cervical spine and lumbar spine stiffness and pain. Patient will benefit from skilled phsycial therapy to increase cervical spine, thoracic spine, lumbar spine and hip mobility to improve UE and LE strength so patient can improve LE and UE strength. Note that patient performed positive hoovers sign and was very quick to give up on manual muscle testing unless maximally cues verbally or stactilly indicating patient may be malingering.    Pt will benefit from skilled therapeutic intervention in order to improve on the following deficits Decreased endurance;Increased muscle spasms;Improper body mechanics;Impaired flexibility;Decreased strength;Postural dysfunction;Difficulty walking;Decreased mobility;Decreased range of motion;Pain;Increased fascial restricitons   Rehab Potential Fair   Clinical Impairments Affecting Rehab Potential patinet displays some signs consistent with malingering pain.    PT Frequency 2x / week   PT Duration 8 weeks   PT Treatment/Interventions ADLs/Self Care Home Management;Gait training;Neuromuscular re-education;Stair training;Passive range of motion;Patient/family education;Functional mobility training;Therapeutic activities;Manual techniques;Therapeutic exercise;Balance training   PT Next Visit Plan Assess hip mobility and cervical spine joint mobility. intorduce 3 way hamstring, 3 way hip flexor and pirifomis stretches, 3D hip excursions, and 3 way pec stretch   PT Home Exercise Plan 3D  thoracic swpine excursions   Consulted and Agree with Plan of Care Patient         Problem List Patient Active Problem List   Diagnosis Date Noted  . Carcinoid tumor of abdomen 04/29/2013    Class: Acute  . Malignant carcinoid tumor of other sites 04/29/2013  . Mixed hyperlipidemia 03/14/2013  . Myalgia and myositis, unspecified 03/14/2013  . Nonspecific (abnormal) findings on radiological and other examination of  gastrointestinal tract 07/13/2012  . Family history of colon cancer 06/16/2012  . Weight loss June 16, 2012  . Abdominal pain 06/16/2012  . GERD (gastroesophageal reflux disease) June 16, 2012  . Dysphagia 16-Jun-2012        G-Codes - 29-Dec-2014 1805    Functional Assessment Tool Used FOTO 48%limited   Functional Limitation Mobility: Walking and moving around   Mobility: Walking and Moving Around Current Status [phone]) At least 40 percent but less than 60 percent impaired, limited or restricted   Mobility: Walking and Moving Around Goal Status [phone]) At least 20 percent but less than 40 percent impaired, limited or restricted      Devona Konig PT DPT [place], [place], [address] Phone: [phone]   Fax:  [phone]

## 2014-10-25 ENCOUNTER — Ambulatory Visit (HOSPITAL_COMMUNITY)
Admission: RE | Admit: 2014-10-25 | Discharge: 2014-10-25 | Disposition: A | Discharge status: Home / Self Care | Payer: PPO | Source: Ambulatory Visit | Attending: Internal Medicine | Admitting: Internal Medicine

## 2014-10-25 DIAGNOSIS — M5441 Lumbago with sciatica, right side: Secondary | ICD-10-CM

## 2014-10-25 DIAGNOSIS — R29898 Other symptoms and signs involving the musculoskeletal system: Secondary | ICD-10-CM

## 2014-10-25 DIAGNOSIS — R262 Difficulty in walking, not elsewhere classified: Secondary | ICD-10-CM

## 2014-10-25 DIAGNOSIS — M542 Cervicalgia: Secondary | ICD-10-CM

## 2014-10-25 DIAGNOSIS — M5442 Lumbago with sciatica, left side: Secondary | ICD-10-CM

## 2014-10-25 DIAGNOSIS — R52 Pain, unspecified: Secondary | ICD-10-CM

## 2014-10-25 DIAGNOSIS — M5384 Other specified dorsopathies, thoracic region: Secondary | ICD-10-CM

## 2014-10-25 DIAGNOSIS — M545 Low back pain: Secondary | ICD-10-CM | POA: Diagnosis not present

## 2014-10-25 NOTE — Therapy (Signed)
[place], [place], [address] Phone: [phone]   Fax:  [phone]  Physical Therapy Treatment  Patient Details  Name: Pamela Hatfield MRN: [id number] Date of Birth: 1950-11-23 Referring Provider:  Delphina Cahill, MD  Encounter Date: 10/25/2014      PT End of Session - 10/25/14 1814    Visit Number 2   Number of Visits 16   Date for PT Re-Evaluation 11/21/14   Authorization Type [employer] team advantage   Authorization - Visit Number 2   Authorization - Number of Visits 16   PT Start Time 2423   PT Stop Time 1826   PT Time Calculation (min) 53 min   Activity Tolerance Patient tolerated treatment well   Behavior During Therapy [facility] for tasks assessed/performed      Past Medical History  Diagnosis Date  . GERD (gastroesophageal reflux disease)   . Weight loss 02/25/2011  . Hypertension   . Asthma   . Hyperlipidemia   . Sleep apnea     no cpap used, pt does not like  . Diabetes mellitus     diet controlled  . Fibromyalgia   . Myalgia and myositis, unspecified   . Dysrhythmia     hx rapid heart beat  . Accessory spleen   . Cancer     located on her aorta and small intestines    Past Surgical History  Procedure Laterality Date  . Rotator cuff repair  yrs ago    left  . Nose surgery  yrs ago  . Breast lumpectomy      both breast  . Esophageal dilation  06/14/2012    Procedure: ESOPHAGEAL DILATION;  Surgeon: Daneil Dolin, MD;  [facility];  Service: Endoscopy;;  . Eus  07/13/2012    Procedure: UPPER ENDOSCOPIC ULTRASOUND (EUS) LINEAR;  Surgeon: Milus Banister, MD;  [facility];  Service: Endoscopy;  Laterality: N/A;  . Back surgery  yrs ago    lower back  . Abdominal hysterectomy  yrs ago    ovaries remain, done because of endometriosis  . Cholecystectomy  yrs ago    There were no vitals taken for this visit.  Visit Diagnosis:  Bilateral low back pain with sciatica,  sciatica laterality unspecified  Cervicalgia  Thoracic spine dysfunction  Upper extremity weakness  Pain aggravated by sitting  Difficulty walking  Weakness of both lower extremities      Subjective Assessment - 10/25/14 1737    Symptoms Pt stated her whole body aches from fibromyalgia with pain scale 6/10 with radicular symptoms down Bil LE Lt>Rt.     Currently in Pain? Yes   Pain Score 6    Pain Location Back   Pain Orientation Lower   Pain Descriptors / Indicators Aching;Tingling   Pain Radiating Towards Bil LE posterior Lt>Rt          [facility] PT Assessment - 10/25/14 0001    AROM   Right Hip Extension 5   Right Hip Flexion 56   Right Hip External Rotation  38   Right Hip Internal Rotation  26   Left Hip Extension 5   Left Hip Flexion 48   Left Hip External Rotation  34   Left Hip Internal Rotation  24                  [facility] Adult PT Treatment/Exercise - 10/25/14 0001    Exercises   Exercises Lumbar  Neck Exercises: Seated   Cervical Rotation Both;10 reps   Cervical Rotation Limitations 3D cervical excursion   Lateral Flexion Both;10 reps   Lateral Flexion Limitations 3D cervical excursion   Other Seated Exercise Standing pec stretch 5x 20" 3 way   Lumbar Exercises: Stretches   Active Hamstring Stretch 3 reps;20 seconds   Active Hamstring Stretch Limitations 14in in step 3 way   Hip Flexor Stretch 3 reps;20 seconds   Hip Flexor Stretch Limitations 14in in step 3 way   Piriformis Stretch 3 reps;30 seconds   Piriformis Stretch Limitations supijne 4 way with towel   Lumbar Exercises: Standing   Functional Squats 10 reps   Functional Squats Limitations 3D hip excursion   Lumbar Exercises: Seated   Other Seated Lumbar Exercises 3D thoracic excursion 10x                PT Education - 10/25/14 1805    Education provided Yes   Education Details Explaination of piriformis tightness for sciatic like symptoms Piriformis stretches in supine  and seated    Person(s) Educated Patient   Methods Explanation;Demonstration;Handout   Comprehension Verbalized understanding;Returned demonstration          PT Short Term Goals - 10/25/14 1824    PT SHORT TERM GOAL #1   Title Patient will display independence with HEP   PT SHORT TERM GOAL #2   Title Patient will display increasd hip internal rotation to WNL so patient can ambulate with toes naturally in neutral position.    Status On-going   PT SHORT TERM GOAL #3   Title [name] will display increased Lumbar spine flexion to 60 degrees to be able to more easily bend forward and tie shoes.   PT SHORT TERM GOAL #4   Title Patient will display increased  thoracic spine rotation od 40 degrees bilaterally to ambualte iwith increased arm swing during gait.    Status On-going   PT SHORT TERM GOAL #5   Title Patient will displasy increased cervical spine rotation to 60 degrees bilaterally to more easily look over shoulder while driving.    Status On-going           PT Long Term Goals - 10/25/14 1825    PT LONG TERM GOAL #1   Title Patient will state decreased symptoms to <50% while sitting >1 hour.    PT LONG TERM GOAL #2   Title [name] will display increased Lumbar spine flexion to 80 degrees to be able to more easily bend forward and tie shoes   PT LONG TERM GOAL #3   Title Patient will display increased  thoracic spine rotation to 65 degrees to more easily turn around to look over shoulder while sitting.    PT LONG TERM GOAL #4   Title Patient will displasy increased cervical spine rotation to 70 degrees bilaterally to more easily look over shoulder while driving   PT LONG TERM GOAL #5   Title Patient will display increased lumbar spine extension to 40 degrees to more easily look at the stars at night.                Plan - 10/25/14 1815    Clinical Impression Statement Hip mobility assessed with limitations in all planes due to tight hip flexor musculature and weak  gluteals.  Session focus on improve cervical, thoracic and hip mobility with excursions and stretches.  Pt able to demosntrate appropriate form with all exercises following cueing for form/technique with min  difficuty.  Pt given HEP hansouts including hamstring, hip flexor and piriformis stretches.   PT Next Visit Plan Continue stretches and excursions to improve cervical and hip mobitly.        Problem List Patient Active Problem List   Diagnosis Date Noted  . Carcinoid tumor of abdomen 04/29/2013    Class: Acute  . Malignant carcinoid tumor of other sites 04/29/2013  . Mixed hyperlipidemia 03/14/2013  . Myalgia and myositis, unspecified 03/14/2013  . Nonspecific (abnormal) findings on radiological and other examination of gastrointestinal tract 07/13/2012  . Family history of colon cancer 05/23/2012  . Weight loss 05/23/2012  . Abdominal pain 05/23/2012  . GERD (gastroesophageal reflux disease) 05/23/2012  . Dysphagia 05/23/2012   Ihor Austin, [place]  Aldona Lento 10/25/2014, 6:29 PM  [facility] [address] [place], [place], [address] Phone: [phone]   Fax:  [phone]

## 2014-10-25 NOTE — Patient Instructions (Signed)
Piriformis (Supine)   Cross legs, right on top. Gently pull other knee toward chest until stretch is felt in buttock/hip of top leg. Hold 20 seconds. Repeat 3  times per set. Do 1-2  sets per session. Do 1-2 sessions per day.  http://orth.exer.us/677   Copyright  VHI. All rights reserved.

## 2014-10-30 ENCOUNTER — Ambulatory Visit (HOSPITAL_COMMUNITY): Payer: PPO | Admitting: Physical Therapy

## 2014-10-30 DIAGNOSIS — M545 Low back pain: Secondary | ICD-10-CM | POA: Diagnosis not present

## 2014-10-30 DIAGNOSIS — M5442 Lumbago with sciatica, left side: Secondary | ICD-10-CM

## 2014-10-30 DIAGNOSIS — M5441 Lumbago with sciatica, right side: Secondary | ICD-10-CM

## 2014-10-30 DIAGNOSIS — R262 Difficulty in walking, not elsewhere classified: Secondary | ICD-10-CM

## 2014-10-30 DIAGNOSIS — R29898 Other symptoms and signs involving the musculoskeletal system: Secondary | ICD-10-CM

## 2014-10-30 DIAGNOSIS — R52 Pain, unspecified: Secondary | ICD-10-CM

## 2014-10-30 DIAGNOSIS — M542 Cervicalgia: Secondary | ICD-10-CM

## 2014-10-30 DIAGNOSIS — M5384 Other specified dorsopathies, thoracic region: Secondary | ICD-10-CM

## 2014-10-30 NOTE — Therapy (Signed)
[place], [place], [address] Phone: [phone]   Fax:  [phone]  Physical Therapy Treatment  Patient Details  Name: Pamela Hatfield MRN: [id number] Date of Birth: 17-May-1951 Referring Provider:  Delphina Cahill, MD  Encounter Date: 10/30/2014      PT End of Session - 10/30/14 1649    Visit Number 3   Number of Visits 16   Date for PT Re-Evaluation 11/21/14   Authorization Type [employer] team advantage   Authorization - Visit Number 3   Authorization - Number of Visits 16   PT Start Time 4481   PT Stop Time 1645   PT Time Calculation (min) 44 min   Activity Tolerance Patient tolerated treatment well   Behavior During Therapy [facility] for tasks assessed/performed      Past Medical History  Diagnosis Date  . GERD (gastroesophageal reflux disease)   . Weight loss 02/25/2011  . Hypertension   . Asthma   . Hyperlipidemia   . Sleep apnea     no cpap used, pt does not like  . Diabetes mellitus     diet controlled  . Fibromyalgia   . Myalgia and myositis, unspecified   . Dysrhythmia     hx rapid heart beat  . Accessory spleen   . Cancer     located on her aorta and small intestines    Past Surgical History  Procedure Laterality Date  . Rotator cuff repair  yrs ago    left  . Nose surgery  yrs ago  . Breast lumpectomy      both breast  . Esophageal dilation  06/14/2012    Procedure: ESOPHAGEAL DILATION;  Surgeon: Daneil Dolin, MD;  [facility];  Service: Endoscopy;;  . Eus  07/13/2012    Procedure: UPPER ENDOSCOPIC ULTRASOUND (EUS) LINEAR;  Surgeon: Milus Banister, MD;  [facility];  Service: Endoscopy;  Laterality: N/A;  . Back surgery  yrs ago    lower back  . Abdominal hysterectomy  yrs ago    ovaries remain, done because of endometriosis  . Cholecystectomy  yrs ago    There were no vitals taken for this visit.  Visit Diagnosis:  Bilateral low back pain with sciatica,  sciatica laterality unspecified  Cervicalgia  Thoracic spine dysfunction  Upper extremity weakness  Pain aggravated by sitting  Difficulty walking  Weakness of both lower extremities      Subjective Assessment - 10/30/14 1623    Symptoms Painet notes continued pain of 7/10 notes that she continued to have her worst pain in her back from her neck to her lumbar spine    Currently in Pain? Yes   Pain Score 7    Pain Location Back   Pain Orientation Posterior;Upper;Mid;Lower   Pain Descriptors / Indicators Aching                    [facility] Adult PT Treatment/Exercise - 10/30/14 0001    Neck Exercises: Seated   Cervical Rotation Both;10 reps   Cervical Rotation Limitations 3D cervical excursion 10cx with functional manual reaction techniques to improve ROM in all 3 planes.    Lateral Flexion Both;10 reps   Lateral Flexion Limitations 3D cervical excursion   Other Seated Exercise Standing pec stretch 1 x 20" 2 way, not over head   Lumbar Exercises: Stretches   Hip Flexor Stretch 20 seconds;2 reps   Hip Flexor Stretch Limitations 14in in step  3 way   Piriformis Stretch 2 reps;30 seconds   Piriformis Stretch Limitations seated   Lumbar Exercises: Standing   Functional Squats 10 reps   Functional Squats Limitations 3D hip excursion   Lumbar Exercises: Seated   Other Seated Lumbar Exercises 3D thoracic excursion 10x   Manual Therapy   Manual Therapy Other (comment)   Other Manual Therapy soft tissue mobilizationof cervical spine, thoracic spine, and lumbar spine paraspinals. Upper trapezius positional release.                   PT Short Term Goals - 10/25/14 1824    PT SHORT TERM GOAL #1   Title Patient will display independence with HEP   PT SHORT TERM GOAL #2   Title Patient will display increasd hip internal rotation to WNL so patient can ambulate with toes naturally in neutral position.    Status On-going   PT SHORT TERM GOAL #3   Title [name]  will display increased Lumbar spine flexion to 60 degrees to be able to more easily bend forward and tie shoes.   PT SHORT TERM GOAL #4   Title Patient will display increased  thoracic spine rotation od 40 degrees bilaterally to ambualte iwith increased arm swing during gait.    Status On-going   PT SHORT TERM GOAL #5   Title Patient will displasy increased cervical spine rotation to 60 degrees bilaterally to more easily look over shoulder while driving.    Status On-going           PT Long Term Goals - 10/25/14 1825    PT LONG TERM GOAL #1   Title Patient will state decreased symptoms to <50% while sitting >1 hour.    PT LONG TERM GOAL #2   Title [name] will display increased Lumbar spine flexion to 80 degrees to be able to more easily bend forward and tie shoes   PT LONG TERM GOAL #3   Title Patient will display increased  thoracic spine rotation to 65 degrees to more easily turn around to look over shoulder while sitting.    PT LONG TERM GOAL #4   Title Patient will displasy increased cervical spine rotation to 70 degrees bilaterally to more easily look over shoulder while driving   PT LONG TERM GOAL #5   Title Patient will display increased lumbar spine extension to 40 degrees to more easily look at the stars at night.                Plan - 10/30/14 1649    Clinical Impression Statement Session focused on increasing hip, cervical and thoraicc spine mobility and decreasing pain in thoracic spine. Patient demosntrated decreased pain folowing hip and cervical spine excursions allowing for improved performance of thoracic spine excursions and further decrease in pain. Patient thoraicc spine pain appears to be in rhomboid/mid/low trapezius region resulting in limited ability to reach over head.    PT Next Visit Plan Continue stretches and excursions to improve cervical and hip mobitly. introduce 3D dowel pendulems with functional  manual reaction techniques to increase  scapula-humeral rhythm.         Problem List Patient Active Problem List   Diagnosis Date Noted  . Carcinoid tumor of abdomen 04/29/2013    Class: Acute  . Malignant carcinoid tumor of other sites 04/29/2013  . Mixed hyperlipidemia 03/14/2013  . Myalgia and myositis, unspecified 03/14/2013  . Nonspecific (abnormal) findings on radiological and other examination of gastrointestinal tract 07/13/2012  .  Family history of colon cancer 05/23/2012  . Weight loss 05/23/2012  . Abdominal pain 05/23/2012  . GERD (gastroesophageal reflux disease) 05/23/2012  . Dysphagia 05/23/2012   Devona Konig PT DPT [place], [place], [address] Phone: [phone]   Fax:  [phone]

## 2014-10-31 ENCOUNTER — Ambulatory Visit (HOSPITAL_COMMUNITY): Payer: PPO | Admitting: Physical Therapy

## 2014-10-31 DIAGNOSIS — M542 Cervicalgia: Secondary | ICD-10-CM

## 2014-10-31 DIAGNOSIS — M545 Low back pain: Secondary | ICD-10-CM | POA: Diagnosis not present

## 2014-10-31 DIAGNOSIS — M5441 Lumbago with sciatica, right side: Secondary | ICD-10-CM

## 2014-10-31 DIAGNOSIS — M5384 Other specified dorsopathies, thoracic region: Secondary | ICD-10-CM

## 2014-10-31 DIAGNOSIS — R29898 Other symptoms and signs involving the musculoskeletal system: Secondary | ICD-10-CM

## 2014-10-31 DIAGNOSIS — M5442 Lumbago with sciatica, left side: Secondary | ICD-10-CM

## 2014-10-31 DIAGNOSIS — R262 Difficulty in walking, not elsewhere classified: Secondary | ICD-10-CM

## 2014-10-31 DIAGNOSIS — R52 Pain, unspecified: Secondary | ICD-10-CM

## 2014-10-31 NOTE — Therapy (Signed)
[place], [place], [address] Phone: [phone]   Fax:  [phone]  Physical Therapy Treatment  Patient Details  Name: Pamela Hatfield MRN: [id number] Date of Birth: 09/13/1951 Referring Provider:  Delphina Cahill, MD  Encounter Date: 10/31/2014      PT End of Session - 10/31/14 1010    Visit Number 4   Number of Visits 16   Date for PT Re-Evaluation 11/21/14   Authorization Type [employer]   Authorization - Visit Number 4   Authorization - Number of Visits 16   PT Start Time 0930   PT Stop Time 1015   PT Time Calculation (min) 45 min   Activity Tolerance Patient tolerated treatment well   Behavior During Therapy [facility] for tasks assessed/performed      Past Medical History  Diagnosis Date  . GERD (gastroesophageal reflux disease)   . Weight loss 02/25/2011  . Hypertension   . Asthma   . Hyperlipidemia   . Sleep apnea     no cpap used, pt does not like  . Diabetes mellitus     diet controlled  . Fibromyalgia   . Myalgia and myositis, unspecified   . Dysrhythmia     hx rapid heart beat  . Accessory spleen   . Cancer     located on her aorta and small intestines    Past Surgical History  Procedure Laterality Date  . Rotator cuff repair  yrs ago    left  . Nose surgery  yrs ago  . Breast lumpectomy      both breast  . Esophageal dilation  06/14/2012    Procedure: ESOPHAGEAL DILATION;  Surgeon: Daneil Dolin, MD;  [facility];  Service: Endoscopy;;  . Eus  07/13/2012    Procedure: UPPER ENDOSCOPIC ULTRASOUND (EUS) LINEAR;  Surgeon: Milus Banister, MD;  Location: [facility];  Service: Endoscopy;  Laterality: N/A;  . Back surgery  yrs ago    lower back  . Abdominal hysterectomy  yrs ago    ovaries remain, done because of endometriosis  . Cholecystectomy  yrs ago    There were no vitals taken for this visit.  Visit Diagnosis:  Bilateral low back pain with sciatica,  sciatica laterality unspecified  Thoracic spine dysfunction  Cervicalgia  Upper extremity weakness  Pain aggravated by sitting  Difficulty walking  Weakness of both lower extremities      Subjective Assessment - 10/31/14 1013    Symptoms Pt states her pain increased to 8/10 yesterday in her lumbar region. Reports her neck is feeling much better.   States she is currently feeling the same with soreness under her rt rip area today.  Reports complaince with HEP    Currently in Pain? Yes   Pain Score 8    Pain Location Back                    [facility] Adult PT Treatment/Exercise - 10/31/14 0936    Neck Exercises: Seated   Cervical Rotation Both;10 reps   Cervical Rotation Limitations 3D cervical excursion 10cx with functional manual reaction techniques to improve ROM in all 3 planes.    Lateral Flexion Both;10 reps   Lateral Flexion Limitations 3D cervical excursion   Other Seated Exercise corner stretch 3X30"   Lumbar Exercises: Stretches   Active Hamstring Stretch 3 reps;20 seconds   Active Hamstring Stretch Limitations 14in in step 3 way  Hip Flexor Stretch 20 seconds;2 reps   Hip Flexor Stretch Limitations 14in in step 3 way   Piriformis Stretch 2 reps;30 seconds   Piriformis Stretch Limitations seated   Lumbar Exercises: Aerobic   Stationary Bike nustep level 3 hills #3 48minutes UE/LE   Lumbar Exercises: Standing   Functional Squats 10 reps   Functional Squats Limitations 3D hip excursion   Lumbar Exercises: Seated   Other Seated Lumbar Exercises 3D thoracic excursion 10x with UE movements                  PT Short Term Goals - 10/25/14 1824    PT SHORT TERM GOAL #1   Title Patient will display independence with HEP   PT SHORT TERM GOAL #2   Title Patient will display increasd hip internal rotation to WNL so patient can ambulate with toes naturally in neutral position.    Status On-going   PT SHORT TERM GOAL #3   Title [name] will display  increased Lumbar spine flexion to 60 degrees to be able to more easily bend forward and tie shoes.   PT SHORT TERM GOAL #4   Title Patient will display increased  thoracic spine rotation od 40 degrees bilaterally to ambualte iwith increased arm swing during gait.    Status On-going   PT SHORT TERM GOAL #5   Title Patient will displasy increased cervical spine rotation to 60 degrees bilaterally to more easily look over shoulder while driving.    Status On-going           PT Long Term Goals - 10/25/14 1825    PT LONG TERM GOAL #1   Title Patient will state decreased symptoms to <50% while sitting >1 hour.    PT LONG TERM GOAL #2   Title [name] will display increased Lumbar spine flexion to 80 degrees to be able to more easily bend forward and tie shoes   PT LONG TERM GOAL #3   Title Patient will display increased  thoracic spine rotation to 65 degrees to more easily turn around to look over shoulder while sitting.    PT LONG TERM GOAL #4   Title Patient will displasy increased cervical spine rotation to 70 degrees bilaterally to more easily look over shoulder while driving   PT LONG TERM GOAL #5   Title Patient will display increased lumbar spine extension to 40 degrees to more easily look at the stars at night.                Plan - 10/31/14 1010    Clinical Impression Statement Continued to focus on increasing spinal mobility.  Instructed with corner stretch to complete bilateral pec stretch simultaneously.   Added nustep today to utilize muscular endurance for UE/LE musculature and general actvitiy tolerance.  PT without reports of pain during session today.    PT Next Visit Plan Continue stretches and excursions to improve cervical and hip mobitly. introduce 3D dowel pendulums with functional  manual reaction techniques to increase scapula-humeral rhythm.         Problem List Patient Active Problem List   Diagnosis Date Noted  . Carcinoid tumor of abdomen 04/29/2013     Class: Acute  . Malignant carcinoid tumor of other sites 04/29/2013  . Mixed hyperlipidemia 03/14/2013  . Myalgia and myositis, unspecified 03/14/2013  . Nonspecific (abnormal) findings on radiological and other examination of gastrointestinal tract 07/13/2012  . Family history of colon cancer 05/23/2012  . Weight loss 05/23/2012  .  Abdominal pain 05/23/2012  . GERD (gastroesophageal reflux disease) 05/23/2012  . Dysphagia 05/23/2012    Teena Irani, PTA/CLT [phone] 10/31/2014, 10:15 AM  [place], [place], [address] Phone: [phone]   Fax:  [phone]

## 2014-11-05 ENCOUNTER — Ambulatory Visit (HOSPITAL_COMMUNITY): Payer: PPO | Admitting: Physical Therapy

## 2014-11-05 ENCOUNTER — Encounter (HOSPITAL_COMMUNITY): Payer: Self-pay | Admitting: Physical Therapy

## 2014-11-05 DIAGNOSIS — R262 Difficulty in walking, not elsewhere classified: Secondary | ICD-10-CM

## 2014-11-05 DIAGNOSIS — M5441 Lumbago with sciatica, right side: Secondary | ICD-10-CM

## 2014-11-05 DIAGNOSIS — R29898 Other symptoms and signs involving the musculoskeletal system: Secondary | ICD-10-CM

## 2014-11-05 DIAGNOSIS — M5384 Other specified dorsopathies, thoracic region: Secondary | ICD-10-CM

## 2014-11-05 DIAGNOSIS — M5442 Lumbago with sciatica, left side: Secondary | ICD-10-CM

## 2014-11-05 DIAGNOSIS — M542 Cervicalgia: Secondary | ICD-10-CM

## 2014-11-05 DIAGNOSIS — R52 Pain, unspecified: Secondary | ICD-10-CM

## 2014-11-05 DIAGNOSIS — M545 Low back pain: Secondary | ICD-10-CM | POA: Diagnosis not present

## 2014-11-05 NOTE — Therapy (Signed)
[place], [place], [address] Phone: [phone]   Fax:  [phone]  Physical Therapy Treatment  Patient Details  Name: Pamela Hatfield MRN: [id number] Date of Birth: Jan 02, 1951 Referring Provider:  Delphina Cahill, MD  Encounter Date: 11/05/2014      PT End of Session - 11/05/14 1801    Visit Number 5   Number of Visits 16   Date for PT Re-Evaluation 11/21/14   Authorization Type [employer]   Authorization - Visit Number 5   Authorization - Number of Visits 16   PT Start Time 2409   PT Stop Time 1830   PT Time Calculation (min) 50 min   Activity Tolerance Patient tolerated treatment well   Behavior During Therapy [facility] for tasks assessed/performed      Past Medical History  Diagnosis Date  . GERD (gastroesophageal reflux disease)   . Weight loss 02/25/2011  . Hypertension   . Asthma   . Hyperlipidemia   . Sleep apnea     no cpap used, pt does not like  . Diabetes mellitus     diet controlled  . Fibromyalgia   . Myalgia and myositis, unspecified   . Dysrhythmia     hx rapid heart beat  . Accessory spleen   . Cancer     located on her aorta and small intestines    Past Surgical History  Procedure Laterality Date  . Rotator cuff repair  yrs ago    left  . Nose surgery  yrs ago  . Breast lumpectomy      both breast  . Esophageal dilation  06/14/2012    Procedure: ESOPHAGEAL DILATION;  Surgeon: Daneil Dolin, MD;  [facility];  Service: Endoscopy;;  . Eus  07/13/2012    Procedure: UPPER ENDOSCOPIC ULTRASOUND (EUS) LINEAR;  Surgeon: Milus Banister, MD;  Location: [facility];  Service: Endoscopy;  Laterality: N/A;  . Back surgery  yrs ago    lower back  . Abdominal hysterectomy  yrs ago    ovaries remain, done because of endometriosis  . Cholecystectomy  yrs ago    There were no vitals taken for this visit.  Visit Diagnosis:  Bilateral low back pain with sciatica,  sciatica laterality unspecified  Thoracic spine dysfunction  Cervicalgia  Upper extremity weakness  Difficulty walking  Pain aggravated by sitting  Weakness of both lower extremities      Subjective Assessment - 11/05/14 1757    Symptoms Patient rates pain continues to be 7/10 notes increased pain today she thinks the rotaational exercises have increased her abdominal pain.   Currently in Pain? Yes   Pain Score 7    Pain Location Back          [facility] Adult PT Treatment/Exercise - 11/05/14 0001    Neck Exercises: Seated   Cervical Rotation Both;10 reps   Cervical Rotation Limitations 3D cervical excursion 10cx with functional manual reaction techniques to improve ROM in all 3 planes.    Lateral Flexion Both;10 reps   Lateral Flexion Limitations 3D cervical excursion   Lumbar Exercises: Stretches   Active Hamstring Stretch 3 reps;20 seconds   Active Hamstring Stretch Limitations 14in in step 3 way   Hip Flexor Stretch 20 seconds;2 reps   Hip Flexor Stretch Limitations 14in in step 3 way   Piriformis Stretch 2 reps;30 seconds   Piriformis Stretch Limitations seated   Lumbar Exercises: Standing   Functional Squats  Limitations 3D hip excursion split stance 10x   Other Standing Lumbar Exercises 3D dowel pendulems 10x with PVC pipe 10x   Lumbar Exercises: Supine   Bent Knee Raise 10 reps   Bent Knee Raise Limitations bilateral   Manual Therapy   Other Manual Therapy Rt anteriorly tilted innominate corrected with muscle energy technique and pelvic floor contractions.  illiopsoas manual release            PT Short Term Goals - 10/25/14 1824    PT SHORT TERM GOAL #1   Title Patient will display independence with HEP   PT SHORT TERM GOAL #2   Title Patient will display increasd hip internal rotation to WNL so patient can ambulate with toes naturally in neutral position.    Status On-going   PT SHORT TERM GOAL #3   Title [name] will display increased Lumbar spine  flexion to 60 degrees to be able to more easily bend forward and tie shoes.   PT SHORT TERM GOAL #4   Title Patient will display increased  thoracic spine rotation od 40 degrees bilaterally to ambualte iwith increased arm swing during gait.    Status On-going   PT SHORT TERM GOAL #5   Title Patient will displasy increased cervical spine rotation to 60 degrees bilaterally to more easily look over shoulder while driving.    Status On-going           PT Long Term Goals - 10/25/14 1825    PT LONG TERM GOAL #1   Title Patient will state decreased symptoms to <50% while sitting >1 hour.    PT LONG TERM GOAL #2   Title [name] will display increased Lumbar spine flexion to 80 degrees to be able to more easily bend forward and tie shoes   PT LONG TERM GOAL #3   Title Patient will display increased  thoracic spine rotation to 65 degrees to more easily turn around to look over shoulder while sitting.    PT LONG TERM GOAL #4   Title Patient will displasy increased cervical spine rotation to 70 degrees bilaterally to more easily look over shoulder while driving   PT LONG TERM GOAL #5   Title Patient will display increased lumbar spine extension to 40 degrees to more easily look at the stars at night.                Plan - 11/05/14 1842    Clinical Impression Statement Continued to focus on increasing spinal mobility. Instructed with corner stretch to complete bilateral pec stretch simultaneously. Added nustep today to utilize muscular endurance for UE/LE musculature and general actvitiy tolerance. PT without reports of pain during session today. noted iliopsoas limited mobility  durng ait following manual release and stretch with follow-up  abdominal strengthening patient had decreased pain and  increasded mobility.    PT Next Visit Plan Focus to be on increasing hip flexibility with a particular focus on illiopsoas flexibility and increasing abdominal and glute strength to impriove trunk  stability and pain.          Problem List Patient Active Problem List   Diagnosis Date Noted  . Carcinoid tumor of abdomen 04/29/2013    Class: Acute  . Malignant carcinoid tumor of other sites 04/29/2013  . Mixed hyperlipidemia 03/14/2013  . Myalgia and myositis, unspecified 03/14/2013  . Nonspecific (abnormal) findings on radiological and other examination of gastrointestinal tract 07/13/2012  . Family history of colon cancer 05/23/2012  . Weight loss  05/23/2012  . Abdominal pain 05/23/2012  . GERD (gastroesophageal reflux disease) 05/23/2012  . Dysphagia 05/23/2012   Devona Konig PT DPT [place], [place], [address] Phone: [phone]   Fax:  [phone]

## 2014-11-06 ENCOUNTER — Ambulatory Visit (HOSPITAL_COMMUNITY): Payer: PPO

## 2014-11-12 ENCOUNTER — Ambulatory Visit (HOSPITAL_COMMUNITY): Payer: PPO

## 2014-11-12 ENCOUNTER — Telehealth (HOSPITAL_COMMUNITY): Payer: Self-pay

## 2014-11-12 NOTE — Telephone Encounter (Signed)
She said PT was killing her and she could not take the pain

## 2014-11-14 ENCOUNTER — Encounter (HOSPITAL_COMMUNITY): Payer: PPO | Admitting: Physical Therapy

## 2014-11-15 ENCOUNTER — Encounter (HOSPITAL_COMMUNITY): Payer: Self-pay | Admitting: Physical Therapy

## 2014-11-15 NOTE — Therapy (Signed)
[place], [place], [address] Phone: [phone]   Fax:  [phone]  Patient Details  Name: Pamela Hatfield MRN: [id number] Date of Birth: 24-Feb-1951 Referring Provider:  No ref. provider found  Encounter Date: 11/15/2014  PHYSICAL THERAPY DISCHARGE SUMMARY  Visits from Start of Care: 4  Patient self discharged from therapy due to "PT is killing me and I am hurt so much from it." Despite patient routinely admitting at end of each session that the physical therapy as decreased her pain and she leaves pt with no pain.   Current functional level related to goals / functional outcomes:  PT SHORT TERM GOAL #1   Title Patient will display independence with HEP   PT SHORT TERM GOAL #2   Title Patient will display increasd hip internal rotation to WNL so patient can ambulate with toes naturally in neutral position.    Status On-going   PT SHORT TERM GOAL #3   Title [name] will display increased Lumbar spine flexion to 60 degrees to be able to more easily bend forward and tie shoes.   PT SHORT TERM GOAL #4   Title Patient will display increased thoracic spine rotation od 40 degrees bilaterally to ambualte iwith increased arm swing during gait.    Status On-going   PT SHORT TERM GOAL #5   Title Patient will displasy increased cervical spine rotation to 60 degrees bilaterally to more easily look over shoulder while driving.    Status On-going           PT Long Term Goals   PT LONG TERM GOAL #1   Title Patient will state decreased symptoms to <50% while sitting >1 hour.    PT LONG TERM GOAL #2   Title [name] will display increased Lumbar spine flexion to 80 degrees to be able to more easily bend forward and tie shoes   PT LONG TERM GOAL #3   Title Patient will display increased thoracic spine rotation to 65 degrees to more easily turn around to look over shoulder  while sitting.    PT LONG TERM GOAL #4   Title Patient will displasy increased cervical spine rotation to 70 degrees bilaterally to more easily look over shoulder while driving   PT LONG TERM GOAL #5   Title Patient will display increased lumbar spine extension to 40 degrees to more easily look at the stars at night.          Plan: Patient agrees to discharge.  Patient goals were not met. Patient is being discharged due to the patient's request.  ?????       Devona Konig PT DPT [place] [address] [place], [place], [address] Phone: [phone]   Fax:  [phone]

## 2014-11-19 ENCOUNTER — Encounter (HOSPITAL_COMMUNITY): Payer: PPO

## 2014-11-21 ENCOUNTER — Encounter (HOSPITAL_COMMUNITY): Payer: PPO | Admitting: Physical Therapy

## 2014-11-26 ENCOUNTER — Encounter (HOSPITAL_COMMUNITY): Payer: PPO | Admitting: Physical Therapy

## 2014-11-28 ENCOUNTER — Encounter (HOSPITAL_COMMUNITY): Payer: PPO

## 2014-12-03 ENCOUNTER — Encounter (HOSPITAL_COMMUNITY): Payer: PPO | Admitting: Physical Therapy

## 2014-12-05 ENCOUNTER — Ambulatory Visit (INDEPENDENT_AMBULATORY_CARE_PROVIDER_SITE_OTHER): Payer: PPO | Admitting: Otolaryngology

## 2014-12-05 ENCOUNTER — Encounter (HOSPITAL_COMMUNITY): Payer: PPO | Admitting: Physical Therapy

## 2014-12-05 DIAGNOSIS — J310 Chronic rhinitis: Secondary | ICD-10-CM

## 2014-12-05 DIAGNOSIS — J0101 Acute recurrent maxillary sinusitis: Secondary | ICD-10-CM | POA: Diagnosis not present

## 2014-12-05 DIAGNOSIS — J343 Hypertrophy of nasal turbinates: Secondary | ICD-10-CM | POA: Diagnosis not present

## 2014-12-05 NOTE — Addendum Note (Signed)
Encounter addended by: Leia Alf, PT on: 12/05/2014  6:07 PM<BR>     Documentation filed: Clinical Notes, Flowsheet VN

## 2014-12-10 ENCOUNTER — Encounter (HOSPITAL_COMMUNITY): Payer: PPO

## 2014-12-12 ENCOUNTER — Encounter (HOSPITAL_COMMUNITY): Payer: PPO | Admitting: Physical Therapy

## 2014-12-26 ENCOUNTER — Ambulatory Visit (INDEPENDENT_AMBULATORY_CARE_PROVIDER_SITE_OTHER): Payer: PPO | Admitting: Otolaryngology

## 2014-12-26 DIAGNOSIS — J0101 Acute recurrent maxillary sinusitis: Secondary | ICD-10-CM

## 2014-12-26 DIAGNOSIS — J343 Hypertrophy of nasal turbinates: Secondary | ICD-10-CM

## 2014-12-26 DIAGNOSIS — J310 Chronic rhinitis: Secondary | ICD-10-CM

## 2015-03-10 ENCOUNTER — Other Ambulatory Visit: Payer: Self-pay

## 2015-05-06 ENCOUNTER — Other Ambulatory Visit (HOSPITAL_COMMUNITY): Payer: Self-pay | Admitting: Internal Medicine

## 2015-05-06 ENCOUNTER — Emergency Department (HOSPITAL_COMMUNITY)
Admission: EM | Admit: 2015-05-06 | Discharge: 2015-05-06 | Disposition: A | Discharge status: Home / Self Care | Payer: PPO | Attending: Emergency Medicine | Admitting: Emergency Medicine

## 2015-05-06 ENCOUNTER — Ambulatory Visit (HOSPITAL_COMMUNITY)
Admission: RE | Admit: 2015-05-06 | Discharge: 2015-05-06 | Disposition: A | Discharge status: Home / Self Care | Payer: PPO | Source: Ambulatory Visit | Attending: Internal Medicine | Admitting: Internal Medicine

## 2015-05-06 ENCOUNTER — Encounter (HOSPITAL_COMMUNITY): Payer: Self-pay | Admitting: *Deleted

## 2015-05-06 DIAGNOSIS — Z90710 Acquired absence of both cervix and uterus: Secondary | ICD-10-CM | POA: Insufficient documentation

## 2015-05-06 DIAGNOSIS — Z791 Long term (current) use of non-steroidal anti-inflammatories (NSAID): Secondary | ICD-10-CM | POA: Insufficient documentation

## 2015-05-06 DIAGNOSIS — Z9049 Acquired absence of other specified parts of digestive tract: Secondary | ICD-10-CM | POA: Insufficient documentation

## 2015-05-06 DIAGNOSIS — R1084 Generalized abdominal pain: Secondary | ICD-10-CM | POA: Insufficient documentation

## 2015-05-06 DIAGNOSIS — E119 Type 2 diabetes mellitus without complications: Secondary | ICD-10-CM | POA: Insufficient documentation

## 2015-05-06 DIAGNOSIS — R197 Diarrhea, unspecified: Secondary | ICD-10-CM | POA: Diagnosis not present

## 2015-05-06 DIAGNOSIS — R110 Nausea: Secondary | ICD-10-CM

## 2015-05-06 DIAGNOSIS — Z859 Personal history of malignant neoplasm, unspecified: Secondary | ICD-10-CM | POA: Insufficient documentation

## 2015-05-06 DIAGNOSIS — I10 Essential (primary) hypertension: Secondary | ICD-10-CM | POA: Insufficient documentation

## 2015-05-06 DIAGNOSIS — M797 Fibromyalgia: Secondary | ICD-10-CM | POA: Insufficient documentation

## 2015-05-06 DIAGNOSIS — R1011 Right upper quadrant pain: Secondary | ICD-10-CM

## 2015-05-06 DIAGNOSIS — Q8909 Congenital malformations of spleen: Secondary | ICD-10-CM | POA: Diagnosis not present

## 2015-05-06 DIAGNOSIS — J45909 Unspecified asthma, uncomplicated: Secondary | ICD-10-CM | POA: Insufficient documentation

## 2015-05-06 DIAGNOSIS — R1083 Colic: Secondary | ICD-10-CM

## 2015-05-06 DIAGNOSIS — C7A00 Malignant carcinoid tumor of unspecified site: Secondary | ICD-10-CM

## 2015-05-06 DIAGNOSIS — Z8669 Personal history of other diseases of the nervous system and sense organs: Secondary | ICD-10-CM | POA: Insufficient documentation

## 2015-05-06 DIAGNOSIS — Z79899 Other long term (current) drug therapy: Secondary | ICD-10-CM | POA: Insufficient documentation

## 2015-05-06 DIAGNOSIS — K598 Other specified functional intestinal disorders: Secondary | ICD-10-CM | POA: Diagnosis not present

## 2015-05-06 DIAGNOSIS — K219 Gastro-esophageal reflux disease without esophagitis: Secondary | ICD-10-CM | POA: Diagnosis not present

## 2015-05-06 DIAGNOSIS — Z85068 Personal history of other malignant neoplasm of small intestine: Secondary | ICD-10-CM | POA: Diagnosis not present

## 2015-05-06 DIAGNOSIS — Z87891 Personal history of nicotine dependence: Secondary | ICD-10-CM | POA: Insufficient documentation

## 2015-05-06 LAB — COMPREHENSIVE METABOLIC PANEL
ALT: 22 U/L (ref 14–54)
AST: 32 U/L (ref 15–41)
Albumin: 3.6 g/dL (ref 3.5–5.0)
Alkaline Phosphatase: 104 U/L (ref 38–126)
Anion gap: 10 (ref 5–15)
BUN: 12 mg/dL (ref 6–20)
CO2: 25 mmol/L (ref 22–32)
Calcium: 8.5 mg/dL — ABNORMAL LOW (ref 8.9–10.3)
Chloride: 97 mmol/L — ABNORMAL LOW (ref 101–111)
Creatinine, Ser: 0.94 mg/dL (ref 0.44–1.00)
GFR calc Af Amer: >60 mL/min (ref >60)
GFR calc non Af Amer: >60 mL/min (ref >60)
Glucose, Bld: 107 mg/dL — ABNORMAL HIGH (ref 65–99)
Potassium: 3.9 mmol/L (ref 3.5–5.1)
Sodium: 132 mmol/L — ABNORMAL LOW (ref 135–145)
Total Bilirubin: 0.5 mg/dL (ref 0.3–1.2)
Total Protein: 7.4 g/dL (ref 6.5–8.1)

## 2015-05-06 LAB — CBC WITH DIFFERENTIAL/PLATELET
Basophils Absolute: 0.1 10*3/uL (ref 0.0–0.1)
Basophils Relative: 0 % (ref 0–1)
Eosinophils Absolute: 0 10*3/uL (ref 0.0–0.7)
Eosinophils Relative: 0 % (ref 0–5)
HCT: 35.9 % — ABNORMAL LOW (ref 36.0–46.0)
Hemoglobin: 11.8 g/dL — ABNORMAL LOW (ref 12.0–15.0)
Lymphocytes Relative: 17 % (ref 12–46)
Lymphs Abs: 2.2 10*3/uL (ref 0.7–4.0)
MCH: 29.7 pg (ref 26.0–34.0)
MCHC: 32.9 g/dL (ref 30.0–36.0)
MCV: 90.4 fL (ref 78.0–100.0)
Monocytes Absolute: 0.9 10*3/uL (ref 0.1–1.0)
Monocytes Relative: 7 % (ref 3–12)
Neutro Abs: 10 10*3/uL — ABNORMAL HIGH (ref 1.7–7.7)
Neutrophils Relative %: 76 % (ref 43–77)
Platelets: 323 10*3/uL (ref 150–400)
RBC: 3.97 MIL/uL (ref 3.87–5.11)
RDW: 12 % (ref 11.5–15.5)
WBC: 13.2 10*3/uL — ABNORMAL HIGH (ref 4.0–10.5)

## 2015-05-06 LAB — LIPASE, BLOOD: Lipase: 27 U/L (ref 22–51)

## 2015-05-06 LAB — URINE MICROSCOPIC-ADD ON

## 2015-05-06 LAB — URINALYSIS, ROUTINE W REFLEX MICROSCOPIC
Bilirubin Urine: NEGATIVE
Glucose, UA: NEGATIVE mg/dL
Ketones, ur: NEGATIVE mg/dL
Leukocytes, UA: NEGATIVE
Nitrite: NEGATIVE
Protein, ur: NEGATIVE mg/dL
Specific Gravity, Urine: <1.005 — ABNORMAL LOW (ref 1.005–1.030)
Urobilinogen, UA: 0.2 mg/dL (ref 0.0–1.0)
pH: 7.5 (ref 5.0–8.0)

## 2015-05-06 LAB — POCT I-STAT CREATININE: Creatinine, Ser: 0.9 mg/dL (ref 0.44–1.00)

## 2015-05-06 MED ORDER — PROMETHAZINE HCL 25 MG/ML IJ SOLN: 12.5000 mg | Freq: Once | INTRAMUSCULAR | Status: DC | PRN

## 2015-05-06 MED ORDER — HYDROMORPHONE HCL 1 MG/ML IJ SOLN
1.0000 mg | INTRAMUSCULAR | Status: DC | PRN
  Administered 2015-05-06: 1 mg via INTRAVENOUS
  Filled 2015-05-06: qty 1

## 2015-05-06 MED ORDER — HYDROCODONE-ACETAMINOPHEN 5-325 MG PO TABS: 2.0000 | ORAL_TABLET | ORAL | Status: DC | PRN

## 2015-05-06 MED ORDER — DICYCLOMINE HCL 20 MG PO TABS: 20.0000 mg | ORAL_TABLET | Freq: Two times a day (BID) | ORAL | Status: DC

## 2015-05-06 MED ORDER — IOHEXOL 300 MG/ML  SOLN
100.0000 mL | Freq: Once | INTRAMUSCULAR | Status: AC | PRN
  Administered 2015-05-06: 80 mL via INTRAVENOUS

## 2015-05-06 MED ORDER — DICYCLOMINE HCL 10 MG/ML IM SOLN
20.0000 mg | Freq: Once | INTRAMUSCULAR | Status: AC
  Administered 2015-05-06: 20 mg via INTRAMUSCULAR
  Filled 2015-05-06: qty 2

## 2015-05-06 MED ORDER — ONDANSETRON HCL 4 MG/2ML IJ SOLN
4.0000 mg | Freq: Once | INTRAMUSCULAR | Status: AC
  Administered 2015-05-06: 4 mg via INTRAVENOUS
  Filled 2015-05-06: qty 2

## 2015-05-06 MED ORDER — HYDROMORPHONE HCL 1 MG/ML IJ SOLN
1.0000 mg | Freq: Once | INTRAMUSCULAR | Status: AC
  Administered 2015-05-06: 1 mg via INTRAVENOUS
  Filled 2015-05-06: qty 1

## 2015-05-06 MED ORDER — ONDANSETRON 4 MG PO TBDP: 4.0000 mg | ORAL_TABLET | Freq: Three times a day (TID) | ORAL | Status: DC | PRN

## 2015-05-06 MED ORDER — MORPHINE SULFATE (PF) 4 MG/ML IV SOLN
4.0000 mg | INTRAVENOUS | Status: DC | PRN
  Administered 2015-05-06: 4 mg via INTRAVENOUS
  Filled 2015-05-06: qty 1

## 2015-05-06 NOTE — ED Notes (Signed)
Pt had abdominal pain starting today around 0100 with diarrhea present as well. Pain is localized to upper abdomen and back. Pt was in CT getting a scan (referred by Dr. Nevada Crane). Pt does have CA around her aorta.  PT ALREADY HAS IV ESTABLISHED.

## 2015-05-06 NOTE — ED Notes (Signed)
Pt called out stating the pain medication that she received did not work on her pain; EDP made aware and new orders given and carried out

## 2015-05-06 NOTE — ED Provider Notes (Signed)
CSN: [id number]     Arrival date & time 05/06/15  1840 History   First MD Initiated Contact with Patient 05/06/15 1852     No chief complaint on file.     HPI  Patient presents for evaluation of abdominal pain.  She has history of a carcinoid tumor. Was getting simvastatin injections for some time. Is now" watchful waiting "with her physician. Planned follow-up imaging.  Was in her normal state of health until this morning per she waking had multiple episodes of diarrhea. This is slow down. She had some nausea and retching but no vomiting. She's had severe intestinal cramping and presents here. No blood pus or mucus in her stools. No frank emesis. No fevers no chills.  Past Medical History  Diagnosis Date  . GERD (gastroesophageal reflux disease)   . Weight loss 02/25/2011  . Hypertension   . Asthma   . Hyperlipidemia   . Sleep apnea     no cpap used, pt does not like  . Diabetes mellitus     diet controlled  . Fibromyalgia   . Myalgia and myositis, unspecified   . Dysrhythmia     hx rapid heart beat  . Accessory spleen   . Cancer     located on her aorta and small intestines   Past Surgical History  Procedure Laterality Date  . Rotator cuff repair  yrs ago    left  . Nose surgery  yrs ago  . Breast lumpectomy      both breast  . Esophageal dilation  06/14/2012    Procedure: ESOPHAGEAL DILATION;  Surgeon: Daneil Dolin, MD;  [facility];  Service: Endoscopy;;  . Eus  07/13/2012    Procedure: UPPER ENDOSCOPIC ULTRASOUND (EUS) LINEAR;  Surgeon: Milus Banister, MD;  Location: [facility];  Service: Endoscopy;  Laterality: N/A;  . Back surgery  yrs ago    lower back  . Abdominal hysterectomy  yrs ago    ovaries remain, done because of endometriosis  . Cholecystectomy  yrs ago   Family History  Problem Relation Age of Onset  . Colon cancer Father   . Colon cancer Maternal Aunt   . Colon cancer Maternal Uncle    Social History  Substance Use Topics   . Smoking status: Former Smoker -- 0.50 packs/day for 20 years    Types: Cigarettes    Quit date: 09/13/1992  . Smokeless tobacco: Never Used     Comment: quit about 25 + years ago  . Alcohol Use: No   OB History    No data available     Review of Systems  Constitutional: Negative for fever, chills, diaphoresis, appetite change and fatigue.  HENT: Negative for mouth sores, sore throat and trouble swallowing.   Eyes: Negative for visual disturbance.  Respiratory: Negative for cough, chest tightness, shortness of breath and wheezing.   Cardiovascular: Negative for chest pain.  Gastrointestinal: Positive for nausea, abdominal pain and diarrhea. Negative for vomiting and abdominal distention.  Endocrine: Negative for polydipsia, polyphagia and polyuria.  Genitourinary: Negative for dysuria, frequency and hematuria.  Musculoskeletal: Negative for gait problem.  Skin: Negative for color change, pallor and rash.  Neurological: Negative for dizziness, syncope, light-headedness and headaches.  Hematological: Does not bruise/bleed easily.  Psychiatric/Behavioral: Negative for behavioral problems and confusion.      Allergies  Codeine; Sulfa antibiotics; Tape; and Clarithromycin  Home Medications   Prior to Admission medications   Medication Sig Start Date End Date  Taking? Authorizing Provider  cetirizine (ZYRTEC) 10 MG tablet Take 10 mg by mouth daily as needed for allergies.   Yes Historical Provider, MD  cyclobenzaprine (FLEXERIL) 5 MG tablet Take 5 mg by mouth 3 (three) times daily as needed for muscle spasms.   Yes Historical Provider, MD  enalapril (VASOTEC) 10 MG tablet Take 10 mg by mouth every morning.    Yes Historical Provider, MD  FLUoxetine (PROZAC) 40 MG capsule Take 40 mg by mouth daily.   Yes Historical Provider, MD  fluticasone (FLONASE) 50 MCG/ACT nasal spray Place 1-2 sprays into both nostrils daily as needed for allergies or rhinitis.   Yes Historical Provider, MD   gabapentin (NEURONTIN) 600 MG tablet Take 600 mg by mouth 3 (three) times daily.    Yes Historical Provider, MD  hydrOXYzine (ATARAX/VISTARIL) 10 MG tablet Take 10 mg by mouth 3 (three) times daily as needed for itching.   Yes Historical Provider, MD  lisinopril (PRINIVIL,ZESTRIL) 20 MG tablet Take 20 mg by mouth daily.   Yes Historical Provider, MD  meloxicam (MOBIC) 15 MG tablet Take 15 mg by mouth daily.   Yes Historical Provider, MD  metoprolol (LOPRESSOR) 50 MG tablet Take 50 mg by mouth 2 (two) times daily.   Yes Historical Provider, MD  pantoprazole (PROTONIX) 40 MG tablet Take 40 mg by mouth daily.   Yes Historical Provider, MD  prochlorperazine (COMPAZINE) 5 MG tablet Take 5 mg by mouth every 6 (six) hours as needed for nausea or vomiting.   Yes Historical Provider, MD  traMADol (ULTRAM) 50 MG tablet Take 100 mg by mouth 3 (three) times daily as needed for moderate pain. Pain.   Yes Historical Provider, MD  zolpidem (AMBIEN) 5 MG tablet Take 5 mg by mouth at bedtime as needed for sleep.   Yes Historical Provider, MD  dicyclomine (BENTYL) 20 MG tablet Take 1 tablet (20 mg total) by mouth 2 (two) times daily. 05/06/15   Tanna Furry, MD  HYDROcodone-acetaminophen (NORCO/VICODIN) 5-325 MG per tablet Take 2 tablets by mouth every 4 (four) hours as needed. 05/06/15   Tanna Furry, MD  ondansetron (ZOFRAN ODT) 4 MG disintegrating tablet Take 1 tablet (4 mg total) by mouth every 8 (eight) hours as needed for nausea. 05/06/15   Tanna Furry, MD   BP 163/80 mmHg  Pulse 71  Temp(Src) 98.2 F (36.8 C) (Oral)  Resp 20  Ht 5\' 5"  (1.651 m)  Wt 178 lb (80.74 kg)  BMI 29.62 kg/m2  SpO2 100% Physical Exam  Constitutional: She is oriented to person, place, and time. She appears well-developed and well-nourished. No distress.  HENT:  Head: Normocephalic.  Eyes: Conjunctivae are normal. Pupils are equal, round, and reactive to light. No scleral icterus.  Neck: Normal range of motion. Neck supple. No  thyromegaly present.  Cardiovascular: Normal rate and regular rhythm.  Exam reveals no gallop and no friction rub.   No murmur heard. Pulmonary/Chest: Effort normal and breath sounds normal. No respiratory distress. She has no wheezes. She has no rales.  Abdominal: Soft. Bowel sounds are normal. She exhibits no distension. There is no tenderness. There is no rebound.  Generalized pain. No focal tenderness. No guarding rebound or peritoneal irritation.  Musculoskeletal: Normal range of motion.  Neurological: She is alert and oriented to person, place, and time.  Skin: Skin is warm and dry. No rash noted.  Psychiatric: She has a normal mood and affect. Her behavior is normal.    ED Course  Procedures (  including critical care time) Labs Review Labs Reviewed  CBC WITH DIFFERENTIAL/PLATELET - Abnormal; Notable for the following:    WBC 13.2 (*)    Hemoglobin 11.8 (*)    HCT 35.9 (*)    Neutro Abs 10.0 (*)    All other components within normal limits  COMPREHENSIVE METABOLIC PANEL - Abnormal; Notable for the following:    Sodium 132 (*)    Chloride 97 (*)    Glucose, Bld 107 (*)    Calcium 8.5 (*)    All other components within normal limits  URINALYSIS, ROUTINE W REFLEX MICROSCOPIC (NOT AT [facility]) - Abnormal; Notable for the following:    Specific Gravity, Urine <1.005 (*)    Hgb urine dipstick SMALL (*)    All other components within normal limits  URINE MICROSCOPIC-ADD ON - Abnormal; Notable for the following:    Squamous Epithelial / LPF FEW (*)    All other components within normal limits  LIPASE, BLOOD    Imaging Review Ct Abdomen Pelvis W Contrast  05/06/2015   CLINICAL DATA:  Severe right upper quadrant pain today. Nausea. History of small intestinal cancer.  EXAM: CT ABDOMEN AND PELVIS WITH CONTRAST  TECHNIQUE: Multidetector CT imaging of the abdomen and pelvis was performed using the standard protocol following bolus administration of intravenous contrast.  CONTRAST:  29mL  OMNIPAQUE IOHEXOL 300 MG/ML  SOLN  COMPARISON:  PET-CT 03/23/2013.  CT abdomen and pelvis 01/19/2013.  FINDINGS: Atelectasis in the lung bases.  Surgical absence of the gallbladder. No bile duct dilatation. Liver, spleen, adrenal glands, abdominal aorta, inferior vena cava, and retroperitoneal lymph nodes are unremarkable. Sub cm peripherally calcified lesion in the lower pole left kidney probably representing a calcified cyst. Nodule in the tail of the pancreas is unchanged since prior study. Pancreas otherwise unremarkable. Mass or enlarged lymph node anterior to the lower abdominal aorta measures 1.9 x 3.3 cm size appears similar to prior study. Metastasis is not excluded. Stomach, small bowel, and colon are not abnormally distended. Contrast material flows through to the colon without evidence of obstruction of the small bowel. No free air or free fluid in the abdomen.  Pelvis: Appendix is normal. Bladder is decompressed. Uterus is surgically absent. No abnormal adnexal masses. No free or loculated pelvic fluid collections. Rectosigmoid colon is unremarkable. Degenerative changes in the spine. No destructive bone lesions.  IMPRESSION: No acute process demonstrated in the abdomen or pelvis. Mesenteric mass or enlarged lymph nodes demonstrated anterior to the lower aorta. No significant change in size since prior study. As before, malignancy is not excluded. Prominent nodule in the tail of the pancreas of nonspecific etiology also stable since prior study. No evidence of bowel obstruction or inflammation.   Electronically Signed   By: Lucienne Capers M.D.   On: 05/06/2015 18:26   I have personally reviewed and evaluated these images and lab results as part of my medical decision-making.   EKG Interpretation None      MDM   Final diagnoses:  Generalized abdominal pain  Intestinal colic    CT reassuring. No change from her previous diagnosis of carcinoid tumor. No kidney stones. No inflammatory  changes noted. Left reassuring. Minimal elevation of white blood cell count. Symptoms and findings consistent with intestinal colic. No sign of obstruction or ileus. She is tolerating pain after some IV pain medications and fluids. Plan is home, clear liquids, symptomatic treatment. Attention is symptoms. Recheck with any worsening revolving symptoms.    Tanna Furry, MD  05/06/15 2133 

## 2015-05-06 NOTE — Discharge Instructions (Signed)
Clear liquids only tonight, until your symptoms improved. Return to ER with high fever, bloody stools, excessive vomiting, worsening pain, or other new or worsening symptoms.   Abdominal Pain Many things can cause abdominal pain. Usually, abdominal pain is not caused by a disease and will improve without treatment. It can often be observed and treated at home. Your health care provider will do a physical exam and possibly order blood tests and X-rays to help determine the seriousness of your pain. However, in many cases, more time must pass before a clear cause of the pain can be found. Before that point, your health care provider may not know if you need more testing or further treatment. HOME CARE INSTRUCTIONS  Monitor your abdominal pain for any changes. The following actions may help to alleviate any discomfort you are experiencing:  Only take over-the-counter or prescription medicines as directed by your health care provider.  Do not take laxatives unless directed to do so by your health care provider.  Try a clear liquid diet (broth, tea, or water) as directed by your health care provider. Slowly move to a bland diet as tolerated. SEEK MEDICAL CARE IF:  You have unexplained abdominal pain.  You have abdominal pain associated with nausea or diarrhea.  You have pain when you urinate or have a bowel movement.  You experience abdominal pain that wakes you in the night.  You have abdominal pain that is worsened or improved by eating food.  You have abdominal pain that is worsened with eating fatty foods.  You have a fever. SEEK IMMEDIATE MEDICAL CARE IF:   Your pain does not go away within 2 hours.  You keep throwing up (vomiting).  Your pain is felt only in portions of the abdomen, such as the right side or the left lower portion of the abdomen.  You pass bloody or black tarry stools. MAKE SURE YOU:  Understand these instructions.   Will watch your condition.   Will  get help right away if you are not doing well or get worse.  Document Released: 06/09/2005 Document Revised: 09/04/2013 Document Reviewed: 05/09/2013 [facility] Patient Information 2015 [place], [place]. This information is not intended to replace advice given to you by your health care provider. Make sure you discuss any questions you have with your health care provider.

## 2015-09-15 DIAGNOSIS — M6281 Muscle weakness (generalized): Secondary | ICD-10-CM | POA: Diagnosis not present

## 2015-09-15 DIAGNOSIS — M791 Myalgia: Secondary | ICD-10-CM | POA: Diagnosis not present

## 2015-09-15 DIAGNOSIS — R000 Tachycardia, unspecified: Secondary | ICD-10-CM | POA: Diagnosis not present

## 2015-09-16 DIAGNOSIS — R000 Tachycardia, unspecified: Secondary | ICD-10-CM | POA: Diagnosis not present

## 2015-09-16 DIAGNOSIS — M791 Myalgia: Secondary | ICD-10-CM | POA: Diagnosis not present

## 2015-10-07 DIAGNOSIS — Z79899 Other long term (current) drug therapy: Secondary | ICD-10-CM | POA: Diagnosis not present

## 2015-10-07 DIAGNOSIS — K319 Disease of stomach and duodenum, unspecified: Secondary | ICD-10-CM | POA: Diagnosis not present

## 2015-10-07 DIAGNOSIS — D3A00 Benign carcinoid tumor of unspecified site: Secondary | ICD-10-CM | POA: Diagnosis not present

## 2015-10-07 DIAGNOSIS — Z79891 Long term (current) use of opiate analgesic: Secondary | ICD-10-CM | POA: Diagnosis not present

## 2015-10-07 DIAGNOSIS — Z5111 Encounter for antineoplastic chemotherapy: Secondary | ICD-10-CM | POA: Diagnosis not present

## 2015-10-13 DIAGNOSIS — C259 Malignant neoplasm of pancreas, unspecified: Secondary | ICD-10-CM | POA: Diagnosis not present

## 2015-10-13 DIAGNOSIS — M791 Myalgia: Secondary | ICD-10-CM | POA: Diagnosis not present

## 2015-10-13 DIAGNOSIS — M6281 Muscle weakness (generalized): Secondary | ICD-10-CM | POA: Diagnosis not present

## 2015-11-05 DIAGNOSIS — D3A8 Other benign neuroendocrine tumors: Secondary | ICD-10-CM | POA: Diagnosis not present

## 2015-11-05 DIAGNOSIS — Z5111 Encounter for antineoplastic chemotherapy: Secondary | ICD-10-CM | POA: Diagnosis not present

## 2015-11-05 DIAGNOSIS — D3A00 Benign carcinoid tumor of unspecified site: Secondary | ICD-10-CM | POA: Diagnosis not present

## 2015-12-02 DIAGNOSIS — D3A00 Benign carcinoid tumor of unspecified site: Secondary | ICD-10-CM | POA: Diagnosis not present

## 2015-12-02 DIAGNOSIS — Z5111 Encounter for antineoplastic chemotherapy: Secondary | ICD-10-CM | POA: Diagnosis not present

## 2015-12-10 DIAGNOSIS — G4733 Obstructive sleep apnea (adult) (pediatric): Secondary | ICD-10-CM | POA: Diagnosis not present

## 2015-12-25 ENCOUNTER — Other Ambulatory Visit (HOSPITAL_COMMUNITY): Payer: Self-pay | Admitting: Respiratory Therapy

## 2015-12-25 DIAGNOSIS — G4730 Sleep apnea, unspecified: Secondary | ICD-10-CM

## 2015-12-25 DIAGNOSIS — K219 Gastro-esophageal reflux disease without esophagitis: Secondary | ICD-10-CM

## 2015-12-30 DIAGNOSIS — M549 Dorsalgia, unspecified: Secondary | ICD-10-CM | POA: Diagnosis not present

## 2015-12-30 DIAGNOSIS — K639 Disease of intestine, unspecified: Secondary | ICD-10-CM | POA: Diagnosis not present

## 2015-12-30 DIAGNOSIS — D3A00 Benign carcinoid tumor of unspecified site: Secondary | ICD-10-CM | POA: Diagnosis not present

## 2015-12-30 DIAGNOSIS — K319 Disease of stomach and duodenum, unspecified: Secondary | ICD-10-CM | POA: Diagnosis not present

## 2015-12-30 DIAGNOSIS — G4709 Other insomnia: Secondary | ICD-10-CM | POA: Diagnosis not present

## 2015-12-30 DIAGNOSIS — Z9049 Acquired absence of other specified parts of digestive tract: Secondary | ICD-10-CM | POA: Diagnosis not present

## 2015-12-30 DIAGNOSIS — Z5111 Encounter for antineoplastic chemotherapy: Secondary | ICD-10-CM | POA: Diagnosis not present

## 2016-01-12 ENCOUNTER — Emergency Department (HOSPITAL_COMMUNITY)
Admission: EM | Admit: 2016-01-12 | Discharge: 2016-01-12 | Disposition: A | Discharge status: Home / Self Care | Payer: Medicare Other | Attending: Emergency Medicine | Admitting: Emergency Medicine

## 2016-01-12 ENCOUNTER — Emergency Department (HOSPITAL_COMMUNITY): Payer: Medicare Other

## 2016-01-12 ENCOUNTER — Encounter (HOSPITAL_COMMUNITY): Payer: Self-pay

## 2016-01-12 DIAGNOSIS — E119 Type 2 diabetes mellitus without complications: Secondary | ICD-10-CM | POA: Diagnosis not present

## 2016-01-12 DIAGNOSIS — T148 Other injury of unspecified body region: Secondary | ICD-10-CM | POA: Diagnosis not present

## 2016-01-12 DIAGNOSIS — Y929 Unspecified place or not applicable: Secondary | ICD-10-CM | POA: Insufficient documentation

## 2016-01-12 DIAGNOSIS — W01198A Fall on same level from slipping, tripping and stumbling with subsequent striking against other object, initial encounter: Secondary | ICD-10-CM | POA: Diagnosis not present

## 2016-01-12 DIAGNOSIS — S0292XA Unspecified fracture of facial bones, initial encounter for closed fracture: Secondary | ICD-10-CM | POA: Diagnosis not present

## 2016-01-12 DIAGNOSIS — C755 Malignant neoplasm of aortic body and other paraganglia: Secondary | ICD-10-CM | POA: Insufficient documentation

## 2016-01-12 DIAGNOSIS — Y939 Activity, unspecified: Secondary | ICD-10-CM | POA: Insufficient documentation

## 2016-01-12 DIAGNOSIS — S0081XA Abrasion of other part of head, initial encounter: Secondary | ICD-10-CM | POA: Diagnosis not present

## 2016-01-12 DIAGNOSIS — S098XXA Other specified injuries of head, initial encounter: Secondary | ICD-10-CM | POA: Diagnosis not present

## 2016-01-12 DIAGNOSIS — E785 Hyperlipidemia, unspecified: Secondary | ICD-10-CM | POA: Diagnosis not present

## 2016-01-12 DIAGNOSIS — Z79899 Other long term (current) drug therapy: Secondary | ICD-10-CM | POA: Insufficient documentation

## 2016-01-12 DIAGNOSIS — S022XXA Fracture of nasal bones, initial encounter for closed fracture: Secondary | ICD-10-CM | POA: Insufficient documentation

## 2016-01-12 DIAGNOSIS — R51 Headache: Secondary | ICD-10-CM | POA: Diagnosis not present

## 2016-01-12 DIAGNOSIS — I10 Essential (primary) hypertension: Secondary | ICD-10-CM | POA: Diagnosis not present

## 2016-01-12 DIAGNOSIS — J45909 Unspecified asthma, uncomplicated: Secondary | ICD-10-CM | POA: Insufficient documentation

## 2016-01-12 DIAGNOSIS — Z87891 Personal history of nicotine dependence: Secondary | ICD-10-CM | POA: Insufficient documentation

## 2016-01-12 DIAGNOSIS — T07XXXA Unspecified multiple injuries, initial encounter: Secondary | ICD-10-CM

## 2016-01-12 DIAGNOSIS — S0993XA Unspecified injury of face, initial encounter: Secondary | ICD-10-CM | POA: Diagnosis present

## 2016-01-12 DIAGNOSIS — Y999 Unspecified external cause status: Secondary | ICD-10-CM | POA: Diagnosis not present

## 2016-01-12 DIAGNOSIS — S0990XA Unspecified injury of head, initial encounter: Secondary | ICD-10-CM | POA: Diagnosis not present

## 2016-01-12 DIAGNOSIS — R42 Dizziness and giddiness: Secondary | ICD-10-CM | POA: Diagnosis not present

## 2016-01-12 MED ORDER — OXYCODONE-ACETAMINOPHEN 5-325 MG PO TABS: 2.0000 | ORAL_TABLET | ORAL | Status: DC | PRN

## 2016-01-12 MED ORDER — OXYCODONE-ACETAMINOPHEN 5-325 MG PO TABS
2.0000 | ORAL_TABLET | Freq: Once | ORAL | Status: AC
  Administered 2016-01-12: 2 via ORAL
  Filled 2016-01-12: qty 2

## 2016-01-12 MED ORDER — ONDANSETRON 4 MG PO TBDP
4.0000 mg | ORAL_TABLET | Freq: Once | ORAL | Status: AC
  Administered 2016-01-12: 4 mg via ORAL
  Filled 2016-01-12: qty 1

## 2016-01-12 NOTE — ED Notes (Signed)
Pt tripped and fell on her face. Denies LOC. States she was carrying a tote and the lid fell, she tripped over the bottom and fell on cement

## 2016-01-12 NOTE — Discharge Instructions (Signed)
Nasal Fracture °A fracture is a break in a bone. A nasal fracture is a broken nose. Minor breaks do not need treatment. Serious breaks may need surgery. °HOME CARE °· If directed, put ice on the injured area: °¨ Put ice in a plastic bag. °¨ Place a towel between your skin and the bag. °¨ Leave the ice on for 20 minutes, 2-3 times per day. °· Take over-the-counter and prescription medicines only as told by your doctor. °· If your nose bleeds, sit up while you gently squeeze your nose shut for 10 minutes. °· Try to not blow your nose. °· Return to your normal activities as told by your doctor. Ask your doctor what activities are safe for you. °· Do not play contact sports for 3-4 weeks or as told by your doctor. °· Keep all follow-up visits as told by your doctor. This is important. °GET HELP IF: °· You have more pain or very bad pain. °· You keep having nosebleeds. °· The shape of your nose does not return to normal after 5 days. °· You have pus coming out of your nose. °GET HELP RIGHT AWAY IF: °· Your nose bleeds for more than 20 minutes. °· You have clear fluid draining out of your nose. °· You have a grape-like swelling on the inside of your nose. °· You have trouble moving your eyes. °· You keep throwing up (vomiting). °  °This information is not intended to replace advice given to you by your health care provider. Make sure you discuss any questions you have with your health care provider. °  °Document Released: 06/08/2008 Document Revised: 05/21/2015 Document Reviewed: 10/07/2014 °Elsevier Interactive Patient Education ©2016 [employer] ° °

## 2016-01-12 NOTE — ED Provider Notes (Signed)
CSN: [id number]     Arrival date & time 01/12/16  1407 History   First MD Initiated Contact with Patient 01/12/16 1430     Chief Complaint  Patient presents with  . Facial Injury     HPI  She presents for evaluation after a fall. She was carrying a told. The lid fell and she tripped and fell forward. She was not able to break her fall with her hands. Struck her face, primarily nose and 4 head against concrete surface. Has pain. Some bleeding from the nose. No loss of consciousness. No neck or back pain. No other areas of pain, concerns, or injury. She is not anticoagulated.  Past Medical History  Diagnosis Date  . GERD (gastroesophageal reflux disease)   . Weight loss 02/25/2011  . Hypertension   . Asthma   . Hyperlipidemia   . Sleep apnea     no cpap used, pt does not like  . Diabetes mellitus     diet controlled  . Fibromyalgia   . Myalgia and myositis, unspecified   . Dysrhythmia     hx rapid heart beat  . Accessory spleen   . Cancer [facility])     located on her aorta and small intestines   Past Surgical History  Procedure Laterality Date  . Rotator cuff repair  yrs ago    left  . Nose surgery  yrs ago  . Breast lumpectomy      both breast  . Esophageal dilation  06/14/2012    Procedure: ESOPHAGEAL DILATION;  Surgeon: Daneil Dolin, MD;  [facility];  Service: Endoscopy;;  . Eus  07/13/2012    Procedure: UPPER ENDOSCOPIC ULTRASOUND (EUS) LINEAR;  Surgeon: Milus Banister, MD;  Location: [facility];  Service: Endoscopy;  Laterality: N/A;  . Back surgery  yrs ago    lower back  . Abdominal hysterectomy  yrs ago    ovaries remain, done because of endometriosis  . Cholecystectomy  yrs ago   Family History  Problem Relation Age of Onset  . Colon cancer Father   . Colon cancer Maternal Aunt   . Colon cancer Maternal Uncle    Social History  Substance Use Topics  . Smoking status: Former Smoker -- 0.50 packs/day for 20 years    Types: Cigarettes    Quit  date: 09/13/1992  . Smokeless tobacco: Never Used     Comment: quit about 25 + years ago  . Alcohol Use: No   OB History    No data available     Review of Systems  Constitutional: Negative for fever, chills, diaphoresis, appetite change and fatigue.  HENT: Negative for mouth sores, sore throat and trouble swallowing.        Facial pain and nasal abrasions.  Eyes: Negative for visual disturbance.  Respiratory: Negative for cough, chest tightness, shortness of breath and wheezing.   Cardiovascular: Negative for chest pain.  Gastrointestinal: Negative for nausea, vomiting, abdominal pain, diarrhea and abdominal distention.  Endocrine: Negative for polydipsia, polyphagia and polyuria.  Genitourinary: Negative for dysuria, frequency and hematuria.  Musculoskeletal: Negative for gait problem.  Skin: Negative for color change, pallor and rash.  Neurological: Positive for headaches. Negative for dizziness, syncope and light-headedness.  Hematological: Does not bruise/bleed easily.  Psychiatric/Behavioral: Negative for behavioral problems and confusion.      Allergies  Codeine; Sulfa antibiotics; Tape; and Clarithromycin  Home Medications   Prior to Admission medications   Medication Sig Start Date End Date  Taking? Authorizing Provider  cetirizine (ZYRTEC) 10 MG tablet Take 10 mg by mouth daily as needed for allergies.   Yes Historical Provider, MD  cyclobenzaprine (FLEXERIL) 5 MG tablet Take 5 mg by mouth 3 (three) times daily as needed for muscle spasms.   Yes Historical Provider, MD  enalapril (VASOTEC) 10 MG tablet Take 10 mg by mouth every morning.    Yes Historical Provider, MD  FLUoxetine (PROZAC) 40 MG capsule Take 40 mg by mouth daily.   Yes Historical Provider, MD  fluticasone (FLONASE) 50 MCG/ACT nasal spray Place 1-2 sprays into both nostrils daily as needed for allergies or rhinitis.   Yes Historical Provider, MD  gabapentin (NEURONTIN) 600 MG tablet Take 600 mg by mouth  3 (three) times daily.    Yes Historical Provider, MD  HYDROcodone-acetaminophen (NORCO) 10-325 MG tablet Take 1-2 tablets by mouth every 4 (four) hours as needed.  01/02/16  Yes Historical Provider, MD  hydrOXYzine (ATARAX/VISTARIL) 10 MG tablet Take 10 mg by mouth 3 (three) times daily as needed for itching.   Yes Historical Provider, MD  lisinopril (PRINIVIL,ZESTRIL) 20 MG tablet Take 20 mg by mouth daily.   Yes Historical Provider, MD  meloxicam (MOBIC) 15 MG tablet Take 15 mg by mouth daily.   Yes Historical Provider, MD  metoprolol (LOPRESSOR) 50 MG tablet Take 50 mg by mouth 2 (two) times daily.   Yes Historical Provider, MD  pantoprazole (PROTONIX) 40 MG tablet Take 40 mg by mouth daily.   Yes Historical Provider, MD  prochlorperazine (COMPAZINE) 5 MG tablet Take 5 mg by mouth every 6 (six) hours as needed for nausea or vomiting.   Yes Historical Provider, MD  traMADol (ULTRAM) 50 MG tablet Take 100 mg by mouth 3 (three) times daily as needed for moderate pain. Pain.   Yes Historical Provider, MD  zolpidem (AMBIEN) 5 MG tablet Take 5 mg by mouth at bedtime as needed for sleep.   Yes Historical Provider, MD  oxyCODONE-acetaminophen (PERCOCET/ROXICET) 5-325 MG tablet Take 2 tablets by mouth every 4 (four) hours as needed. 01/12/16   Tanna Furry, MD   BP 158/83 mmHg  Pulse 65  Temp(Src) 98 F (36.7 C) (Oral)  Resp 18  Ht 5\' 5"  (1.651 m)  Wt 180 lb (81.647 kg)  BMI 29.95 kg/m2  SpO2 95% Physical Exam  Constitutional: She is oriented to person, place, and time. She appears well-developed and well-nourished. No distress.  HENT:  Head: Normocephalic.    Eyes: Conjunctivae are normal. Pupils are equal, round, and reactive to light. No scleral icterus.  Neck: Normal range of motion. Neck supple. No thyromegaly present.  Cardiovascular: Normal rate and regular rhythm.  Exam reveals no gallop and no friction rub.   No murmur heard. Pulmonary/Chest: Effort normal and breath sounds normal. No  respiratory distress. She has no wheezes. She has no rales.  Abdominal: Soft. Bowel sounds are normal. She exhibits no distension. There is no tenderness. There is no rebound.  Musculoskeletal: Normal range of motion.  Neurological: She is alert and oriented to person, place, and time.  Skin: Skin is warm and dry. No rash noted.  Psychiatric: She has a normal mood and affect. Her behavior is normal.    ED Course  Procedures (including critical care time) Labs Review Labs Reviewed - No data to display  Imaging Review Ct Head Wo Contrast  01/12/2016  CLINICAL DATA:  Tripped and fell on her face today no loss of consciousness EXAM: CT HEAD WITHOUT  CONTRAST CT MAXILLOFACIAL WITHOUT CONTRAST TECHNIQUE: Multidetector CT imaging of the head and maxillofacial structures were performed using the standard protocol without intravenous contrast. Multiplanar CT image reconstructions of the maxillofacial structures were also generated. COMPARISON:  None. FINDINGS: CT HEAD FINDINGS Diffuse age-related atrophy. No hydrocephalus, infarct, mass, hemorrhage or extra-axial fluid. Calvarium intact. CT MAXILLOFACIAL FINDINGS Hairline fracture at the bridge of the nose. Moderate chronic appearing leftward nasal septum deviation. Inflammatory change in the region of the ostiomeatal complex on the right appears stable. Inferior turbinate appears to be absent on the right possibly related to prior surgical change. Mild inflammatory change right maxillary sinus. IMPRESSION: No acute intracranial abnormalities. Hairline fracture at the bridge of the nose, otherwise negative for acute facial bone abnormalities. Electronically Signed   By: Skipper Cliche M.D.   On: 01/12/2016 16:24   Ct Maxillofacial Wo Cm  01/12/2016  CLINICAL DATA:  Tripped and fell on her face today no loss of consciousness EXAM: CT HEAD WITHOUT CONTRAST CT MAXILLOFACIAL WITHOUT CONTRAST TECHNIQUE: Multidetector CT imaging of the head and maxillofacial  structures were performed using the standard protocol without intravenous contrast. Multiplanar CT image reconstructions of the maxillofacial structures were also generated. COMPARISON:  None. FINDINGS: CT HEAD FINDINGS Diffuse age-related atrophy. No hydrocephalus, infarct, mass, hemorrhage or extra-axial fluid. Calvarium intact. CT MAXILLOFACIAL FINDINGS Hairline fracture at the bridge of the nose. Moderate chronic appearing leftward nasal septum deviation. Inflammatory change in the region of the ostiomeatal complex on the right appears stable. Inferior turbinate appears to be absent on the right possibly related to prior surgical change. Mild inflammatory change right maxillary sinus. IMPRESSION: No acute intracranial abnormalities. Hairline fracture at the bridge of the nose, otherwise negative for acute facial bone abnormalities. Electronically Signed   By: Skipper Cliche M.D.   On: 01/12/2016 16:24   I have personally reviewed and evaluated these images and lab results as part of my medical decision-making.   EKG Interpretation None      MDM   Final diagnoses:  Nasal fracture, closed, initial encounter  Multiple abrasions    Nondisplaced nasal fracture. Clinically no septal hematoma. Remainder of exam shows normal vision, etc. movements, and facial sensation. Plan is home, ice, Percocet for pain. Primary care follow-up.    Tanna Furry, MD 01/12/16 [phone]

## 2016-01-27 DIAGNOSIS — H25813 Combined forms of age-related cataract, bilateral: Secondary | ICD-10-CM | POA: Diagnosis not present

## 2016-01-27 DIAGNOSIS — E119 Type 2 diabetes mellitus without complications: Secondary | ICD-10-CM | POA: Diagnosis not present

## 2016-01-27 DIAGNOSIS — H5203 Hypermetropia, bilateral: Secondary | ICD-10-CM | POA: Diagnosis not present

## 2016-01-30 DIAGNOSIS — C7B8 Other secondary neuroendocrine tumors: Secondary | ICD-10-CM | POA: Diagnosis not present

## 2016-01-30 DIAGNOSIS — D3A00 Benign carcinoid tumor of unspecified site: Secondary | ICD-10-CM | POA: Diagnosis not present

## 2016-02-05 DIAGNOSIS — R51 Headache: Secondary | ICD-10-CM | POA: Diagnosis not present

## 2016-02-05 DIAGNOSIS — J309 Allergic rhinitis, unspecified: Secondary | ICD-10-CM | POA: Diagnosis not present

## 2016-02-05 DIAGNOSIS — R42 Dizziness and giddiness: Secondary | ICD-10-CM | POA: Diagnosis not present

## 2016-02-06 ENCOUNTER — Ambulatory Visit: Payer: Medicare Other | Attending: Internal Medicine | Admitting: Neurology

## 2016-02-06 VITALS — Ht 65.0 in | Wt 180.0 lb

## 2016-02-06 DIAGNOSIS — G4733 Obstructive sleep apnea (adult) (pediatric): Secondary | ICD-10-CM | POA: Diagnosis not present

## 2016-02-06 DIAGNOSIS — K219 Gastro-esophageal reflux disease without esophagitis: Secondary | ICD-10-CM

## 2016-02-06 DIAGNOSIS — G4730 Sleep apnea, unspecified: Secondary | ICD-10-CM

## 2016-02-22 NOTE — Procedures (Signed)
Keene A. Merlene Laughter, MD     [email or web address]             NOCTURNAL POLYSOMNOGRAPHY   LOCATION: [facility]   Patient Name: Pamela Hatfield, Pamela Hatfield Date: 02/06/2016 Gender: Female D.O.B: 04/02/1951 Age (years): 53 Referring Provider: Not Available Height (inches): 65 Interpreting Physician: Phillips Odor MD, ABSM Weight (lbs): 180 RPSGT: Peak, Robert BMI: 30 MRN: [id number] Neck Size: 16.00 CLINICAL INFORMATION Sleep Study Type: Split Night CPAP Indication for sleep study: N/A Epworth Sleepiness Score: 3 SLEEP STUDY TECHNIQUE As per the AASM Manual for the Scoring of Sleep and Associated Events v2.3 (April 2016) with a hypopnea requiring 4% desaturations. The channels recorded and monitored were frontal, central and occipital EEG, electrooculogram (EOG), submentalis EMG (chin), nasal and oral airflow, thoracic and abdominal wall motion, anterior tibialis EMG, snore microphone, electrocardiogram, and pulse oximetry. Continuous positive airway pressure (CPAP) was initiated when the patient met split night criteria and was titrated according to treat sleep-disordered breathing. MEDICATIONS Medications taken by the patient : N/A Medications administered by patient during sleep study : Sleep medicine administered - HYDROCODONE at 10:55:36 PM  Current outpatient prescriptions:  .  cetirizine (ZYRTEC) 10 MG tablet, Take 10 mg by mouth daily as needed for allergies., Disp: , Rfl:  .  cyclobenzaprine (FLEXERIL) 5 MG tablet, Take 5 mg by mouth 3 (three) times daily as needed for muscle spasms., Disp: , Rfl:  .  enalapril (VASOTEC) 10 MG tablet, Take 10 mg by mouth every morning. , Disp: , Rfl:  .  FLUoxetine (PROZAC) 40 MG capsule, Take 40 mg by mouth daily., Disp: , Rfl:  .  fluticasone (FLONASE) 50 MCG/ACT nasal spray, Place 1-2 sprays into both nostrils daily as needed for allergies or rhinitis., Disp: , Rfl:  .  gabapentin (NEURONTIN) 600 MG tablet, Take  600 mg by mouth 3 (three) times daily. , Disp: , Rfl:  .  HYDROcodone-acetaminophen (NORCO) 10-325 MG tablet, Take 1-2 tablets by mouth every 4 (four) hours as needed. , Disp: , Rfl:  .  hydrOXYzine (ATARAX/VISTARIL) 10 MG tablet, Take 10 mg by mouth 3 (three) times daily as needed for itching., Disp: , Rfl:  .  lisinopril (PRINIVIL,ZESTRIL) 20 MG tablet, Take 20 mg by mouth daily., Disp: , Rfl:  .  meloxicam (MOBIC) 15 MG tablet, Take 15 mg by mouth daily., Disp: , Rfl:  .  metoprolol (LOPRESSOR) 50 MG tablet, Take 50 mg by mouth 2 (two) times daily., Disp: , Rfl:  .  oxyCODONE-acetaminophen (PERCOCET/ROXICET) 5-325 MG tablet, Take 2 tablets by mouth every 4 (four) hours as needed., Disp: 10 tablet, Rfl: 0 .  pantoprazole (PROTONIX) 40 MG tablet, Take 40 mg by mouth daily., Disp: , Rfl:  .  prochlorperazine (COMPAZINE) 5 MG tablet, Take 5 mg by mouth every 6 (six) hours as needed for nausea or vomiting., Disp: , Rfl:  .  traMADol (ULTRAM) 50 MG tablet, Take 100 mg by mouth 3 (three) times daily as needed for moderate pain. Pain., Disp: , Rfl:  .  zolpidem (AMBIEN) 5 MG tablet, Take 5 mg by mouth at bedtime as needed for sleep., Disp: , Rfl:   RESPIRATORY PARAMETERS Diagnostic Total AHI (/hr): 37.1 RDI (/hr): 51.6 OA Index (/hr): 9.6 CA Index (/hr): 0.0 REM AHI (/hr): N/A NREM AHI (/hr): 37.1 Supine AHI (/hr): N/A Non-supine AHI (/hr): 37.11 Min O2 Sat (%): 86.00 Mean O2 (%): 93.82 Time below 88% (min): 1.9   Titration Optimal Pressure (cm): 12 AHI  at Optimal Pressure (/hr): 0.0 Min O2 at Optimal Pressure (%): 93.0 Supine % at Optimal (%): 0 Sleep % at Optimal (%): 97   SLEEP ARCHITECTURE The recording time for the entire night was 375.6 minutes. During a baseline period of 192.9 minutes, the patient slept for 124.5 minutes in REM and nonREM, yielding a sleep efficiency of 64.5%. Sleep onset after lights out was 34.4 minutes with a REM latency of N/A minutes. The patient spent 42.57% of the  night in stage N1 sleep, 57.43% in stage N2 sleep, 0.00% in stage N3 and 0.00% in REM. During the titration period of 174.7 minutes, the patient slept for 150.0 minutes in REM and nonREM, yielding a sleep efficiency of 85.9%. Sleep onset after CPAP initiation was 13.9 minutes with a REM latency of 111.0 minutes. The patient spent 15.33% of the night in stage N1 sleep, 44.33% in stage N2 sleep, 11.33% in stage N3 and 29.00% in REM. CARDIAC DATA The 2 lead EKG demonstrated sinus rhythm. The mean heart rate was 61.37 beats per minute. Other EKG findings include: PVCs. LEG MOVEMENT DATA The total Periodic Limb Movements of Sleep (PLMS) were 0. The PLMS index was 0.00.   IMPRESSIONS - Severe obstructive sleep apnea occurred during the diagnostic portion of the study (AHI = 37.1/hour). An optimal PAP pressure was selected for this patient ( 12 cm of water).  Delano Metz, MD Diplomate, American Board of Sleep Medicine.

## 2016-02-24 DIAGNOSIS — Z5111 Encounter for antineoplastic chemotherapy: Secondary | ICD-10-CM | POA: Diagnosis not present

## 2016-02-24 DIAGNOSIS — K639 Disease of intestine, unspecified: Secondary | ICD-10-CM | POA: Diagnosis not present

## 2016-02-24 DIAGNOSIS — D3A00 Benign carcinoid tumor of unspecified site: Secondary | ICD-10-CM | POA: Diagnosis not present

## 2016-02-24 DIAGNOSIS — D3A012 Benign carcinoid tumor of the ileum: Secondary | ICD-10-CM | POA: Diagnosis not present

## 2016-02-24 DIAGNOSIS — R109 Unspecified abdominal pain: Secondary | ICD-10-CM | POA: Diagnosis not present

## 2016-03-23 DIAGNOSIS — K639 Disease of intestine, unspecified: Secondary | ICD-10-CM | POA: Diagnosis not present

## 2016-03-23 DIAGNOSIS — K3189 Other diseases of stomach and duodenum: Secondary | ICD-10-CM | POA: Diagnosis not present

## 2016-03-23 DIAGNOSIS — R52 Pain, unspecified: Secondary | ICD-10-CM | POA: Diagnosis not present

## 2016-03-23 DIAGNOSIS — K668 Other specified disorders of peritoneum: Secondary | ICD-10-CM | POA: Diagnosis not present

## 2016-03-23 DIAGNOSIS — K319 Disease of stomach and duodenum, unspecified: Secondary | ICD-10-CM | POA: Diagnosis not present

## 2016-03-23 DIAGNOSIS — Z9049 Acquired absence of other specified parts of digestive tract: Secondary | ICD-10-CM | POA: Diagnosis not present

## 2016-03-23 DIAGNOSIS — E119 Type 2 diabetes mellitus without complications: Secondary | ICD-10-CM | POA: Diagnosis not present

## 2016-03-23 DIAGNOSIS — R590 Localized enlarged lymph nodes: Secondary | ICD-10-CM | POA: Diagnosis not present

## 2016-03-23 DIAGNOSIS — I10 Essential (primary) hypertension: Secondary | ICD-10-CM | POA: Diagnosis not present

## 2016-03-23 DIAGNOSIS — K869 Disease of pancreas, unspecified: Secondary | ICD-10-CM | POA: Diagnosis not present

## 2016-03-23 DIAGNOSIS — K8689 Other specified diseases of pancreas: Secondary | ICD-10-CM | POA: Diagnosis not present

## 2016-03-23 DIAGNOSIS — Z5111 Encounter for antineoplastic chemotherapy: Secondary | ICD-10-CM | POA: Diagnosis not present

## 2016-03-26 DIAGNOSIS — G4733 Obstructive sleep apnea (adult) (pediatric): Secondary | ICD-10-CM | POA: Diagnosis not present

## 2016-04-01 DIAGNOSIS — R7301 Impaired fasting glucose: Secondary | ICD-10-CM | POA: Diagnosis not present

## 2016-04-01 DIAGNOSIS — E782 Mixed hyperlipidemia: Secondary | ICD-10-CM | POA: Diagnosis not present

## 2016-04-06 DIAGNOSIS — R7301 Impaired fasting glucose: Secondary | ICD-10-CM | POA: Diagnosis not present

## 2016-04-06 DIAGNOSIS — I10 Essential (primary) hypertension: Secondary | ICD-10-CM | POA: Diagnosis not present

## 2016-04-06 DIAGNOSIS — G589 Mononeuropathy, unspecified: Secondary | ICD-10-CM | POA: Diagnosis not present

## 2016-04-06 DIAGNOSIS — G4733 Obstructive sleep apnea (adult) (pediatric): Secondary | ICD-10-CM | POA: Diagnosis not present

## 2016-04-20 DIAGNOSIS — Z5111 Encounter for antineoplastic chemotherapy: Secondary | ICD-10-CM | POA: Diagnosis not present

## 2016-04-20 DIAGNOSIS — D3A019 Benign carcinoid tumor of the small intestine, unspecified portion: Secondary | ICD-10-CM | POA: Diagnosis not present

## 2016-04-21 DIAGNOSIS — L905 Scar conditions and fibrosis of skin: Secondary | ICD-10-CM | POA: Diagnosis not present

## 2016-04-21 DIAGNOSIS — L578 Other skin changes due to chronic exposure to nonionizing radiation: Secondary | ICD-10-CM | POA: Diagnosis not present

## 2016-04-26 DIAGNOSIS — G4733 Obstructive sleep apnea (adult) (pediatric): Secondary | ICD-10-CM | POA: Diagnosis not present

## 2016-05-05 ENCOUNTER — Ambulatory Visit (INDEPENDENT_AMBULATORY_CARE_PROVIDER_SITE_OTHER): Payer: Medicare Other | Admitting: Cardiovascular Disease

## 2016-05-05 ENCOUNTER — Encounter: Payer: Self-pay | Admitting: Cardiovascular Disease

## 2016-05-05 DIAGNOSIS — R002 Palpitations: Secondary | ICD-10-CM | POA: Insufficient documentation

## 2016-05-05 DIAGNOSIS — R079 Chest pain, unspecified: Secondary | ICD-10-CM | POA: Insufficient documentation

## 2016-05-05 DIAGNOSIS — E782 Mixed hyperlipidemia: Secondary | ICD-10-CM

## 2016-05-05 DIAGNOSIS — R0789 Other chest pain: Secondary | ICD-10-CM

## 2016-05-05 DIAGNOSIS — I10 Essential (primary) hypertension: Secondary | ICD-10-CM | POA: Insufficient documentation

## 2016-05-05 NOTE — Progress Notes (Signed)
05/05/2016 Pamela Hatfield   15-Nov-1950  [id number]  Primary Physician Wende Neighbors, MD Primary Cardiologist: Lorretta Harp MD Lupe Carney, [place]  HPI:  Pamela Hatfield is a pleasant 65 year old moderately overweight divorced Caucasian female, mother of one child, who I said I have seen remotely. She was referred back to me because of symptoms that occurred back in November of last year with evaluation performed in [place]. She does have a history of hypertension. I do On her 6 years ago that was apparently clean. She does have obstructive sleep apnea C Pap and complaints of atypical chest pain and palpitations.   Current Outpatient Prescriptions  Medication Sig Dispense Refill  . cetirizine (ZYRTEC) 10 MG tablet Take 10 mg by mouth daily as needed for allergies.    . cyclobenzaprine (FLEXERIL) 5 MG tablet Take 5 mg by mouth 3 (three) times daily as needed for muscle spasms.    . enalapril (VASOTEC) 10 MG tablet Take 10 mg by mouth every morning.     Marland Kitchen FLUoxetine (PROZAC) 40 MG capsule Take 40 mg by mouth daily.    . fluticasone (FLONASE) 50 MCG/ACT nasal spray Place 1-2 sprays into both nostrils daily as needed for allergies or rhinitis.    Marland Kitchen gabapentin (NEURONTIN) 600 MG tablet Take 600 mg by mouth 3 (three) times daily.     Marland Kitchen HYDROcodone-acetaminophen (NORCO) 10-325 MG tablet Take 1-2 tablets by mouth every 4 (four) hours as needed.     . hydrOXYzine (ATARAX/VISTARIL) 10 MG tablet Take 10 mg by mouth 3 (three) times daily as needed for itching.    Marland Kitchen lisinopril (PRINIVIL,ZESTRIL) 20 MG tablet Take 20 mg by mouth daily.    . meloxicam (MOBIC) 15 MG tablet Take 15 mg by mouth daily.    . metoprolol (LOPRESSOR) 50 MG tablet Take 50 mg by mouth 2 (two) times daily.    Marland Kitchen oxyCODONE-acetaminophen (PERCOCET/ROXICET) 5-325 MG tablet Take 2 tablets by mouth every 4 (four) hours as needed. 10 tablet 0  . pantoprazole (PROTONIX) 40 MG tablet Take 40 mg by mouth daily.    . prochlorperazine  (COMPAZINE) 5 MG tablet Take 5 mg by mouth every 6 (six) hours as needed for nausea or vomiting.    . traMADol (ULTRAM) 50 MG tablet Take 100 mg by mouth 3 (three) times daily as needed for moderate pain. Pain.    Marland Kitchen zolpidem (AMBIEN) 5 MG tablet Take 5 mg by mouth at bedtime as needed for sleep.     No current facility-administered medications for this visit.     Allergies  Allergen Reactions  . Codeine Itching    Headaches  . Sulfa Antibiotics Hives  . Tape Itching  . Clarithromycin Rash    Social History   Social History  . Marital status: Divorced    Spouse name: N/A  . Number of children: N/A  . Years of education: N/A   Occupational History  . Not on file.   Social History Main Topics  . Smoking status: Former Smoker    Packs/day: 0.50    Years: 20.00    Types: Cigarettes    Quit date: 09/13/1992  . Smokeless tobacco: Never Used     Comment: quit about 25 + years ago  . Alcohol use No  . Drug use: No  . Sexual activity: Not on file   Other Topics Concern  . Not on file   Social History Narrative  . No narrative on file  Review of Systems: General: negative for chills, fever, night sweats or weight changes.  Cardiovascular: negative for chest pain, dyspnea on exertion, edema, orthopnea, palpitations, paroxysmal nocturnal dyspnea or shortness of breath Dermatological: negative for rash Respiratory: negative for cough or wheezing Urologic: negative for hematuria Abdominal: negative for nausea, vomiting, diarrhea, bright red blood per rectum, melena, or hematemesis Neurologic: negative for visual changes, syncope, or dizziness All other systems reviewed and are otherwise negative except as noted above.    Blood pressure 120/80, pulse 63, height 5\' 5"  (1.651 m), weight 175 lb (79.4 kg).  General appearance: alert and no distress Neck: no adenopathy, no carotid bruit, no JVD, supple, symmetrical, trachea midline and thyroid not enlarged, symmetric, no  tenderness/mass/nodules Lungs: clear to auscultation bilaterally Heart: regular rate and rhythm, S1, S2 normal, no murmur, click, rub or gallop Extremities: extremities normal, atraumatic, no cyanosis or edema  EKG sinus rhythm at 63 with nonspecific ST-T wave changes. Personally reviewed this EKG  ASSESSMENT AND PLAN:   Mixed hyperlipidemia History of hyperlipidemia not on statin therapy  Essential hypertension History of hypertension with blood pressure initially 120/80. She is on enalapril and metoprolol. Continue current meds at current dosing  Chest pain History of atypical chest pain with a clean cath performed by myself approximately 6 years ago. I do not think that the occasional chest pain she has is ischemically mediated  Palpitations Patient is palpitations several times a week. She denies caffeine intake. We'll check a 2 week event monitor.      Lorretta Harp MD FACP,FACC,FAHA, [facility] 05/05/2016 12:17 PM

## 2016-05-05 NOTE — Assessment & Plan Note (Signed)
History of hypertension with blood pressure initially 120/80. She is on enalapril and metoprolol. Continue current meds at current dosing

## 2016-05-05 NOTE — Patient Instructions (Signed)
Medication Instructions:  Your physician recommends that you continue on your current medications as directed. Please refer to the Current Medication list given to you today.  Procedure: Your physician has recommended that you wear an event monitor. Event monitors are medical devices that record the heart's electrical activity. Doctors most often [place] these monitors to diagnose arrhythmias. Arrhythmias are problems with the speed or rhythm of the heartbeat. The monitor is a small, portable device. You can wear one while you do your normal daily activities. This is usually used to diagnose what is causing palpitations/syncope (passing out). FOR 2 WEEKS.   Follow-Up: Your physician recommends that you schedule a follow-up appointment ON AN AS NEEDED BASIS.  Medical records will be requested from your primary care physician.   If you need a refill on your cardiac medications before your next appointment, please call your pharmacy.

## 2016-05-05 NOTE — Assessment & Plan Note (Signed)
History of hyperlipidemia not on statin therapy. 

## 2016-05-05 NOTE — Assessment & Plan Note (Signed)
Patient is palpitations several times a week. She denies caffeine intake. We'll check a 2 week event monitor.

## 2016-05-05 NOTE — Assessment & Plan Note (Signed)
History of atypical chest pain with a clean cath performed by myself approximately 6 years ago. I do not think that the occasional chest pain she has is ischemically mediated

## 2016-05-11 ENCOUNTER — Encounter: Payer: Self-pay | Admitting: Cardiovascular Disease

## 2016-05-12 ENCOUNTER — Ambulatory Visit (INDEPENDENT_AMBULATORY_CARE_PROVIDER_SITE_OTHER): Payer: Medicare Other

## 2016-05-12 DIAGNOSIS — I10 Essential (primary) hypertension: Secondary | ICD-10-CM

## 2016-05-12 DIAGNOSIS — E782 Mixed hyperlipidemia: Secondary | ICD-10-CM | POA: Diagnosis not present

## 2016-05-12 DIAGNOSIS — R0789 Other chest pain: Secondary | ICD-10-CM

## 2016-05-12 DIAGNOSIS — R002 Palpitations: Secondary | ICD-10-CM

## 2016-05-20 DIAGNOSIS — D3A00 Benign carcinoid tumor of unspecified site: Secondary | ICD-10-CM | POA: Diagnosis not present

## 2016-05-25 ENCOUNTER — Telehealth: Payer: Self-pay | Admitting: Cardiovascular Disease

## 2016-05-25 NOTE — Telephone Encounter (Signed)
Pt is wearing a 30 days monitor,but breaking out with a rash.Pt wants to know if she can stop wearing the monitor now?

## 2016-05-25 NOTE — Telephone Encounter (Signed)
Pt was set up for 2 week monitor and has already completed 13 days of wear. She is breaking out in a rash and "has nowhere else to put the electrodes" where skin isn't irritated. She denies swelling, shortness of breath, upper resp symptoms. Denies urgent concerns. Advised benadryl or hydrocortisone cream on rash site. If not improved consider PCP or urgent care visit for recommendation on steroid or oral antihistamine. Advised to ship back monitor if she cannot reasonably place electrodes and has completed most of prescribed monitoring period. Pt voiced understanding and thanks.

## 2016-05-27 DIAGNOSIS — G4733 Obstructive sleep apnea (adult) (pediatric): Secondary | ICD-10-CM | POA: Diagnosis not present

## 2016-06-07 DIAGNOSIS — F5101 Primary insomnia: Secondary | ICD-10-CM | POA: Diagnosis not present

## 2016-06-07 DIAGNOSIS — G4733 Obstructive sleep apnea (adult) (pediatric): Secondary | ICD-10-CM | POA: Diagnosis not present

## 2016-06-22 DIAGNOSIS — K319 Disease of stomach and duodenum, unspecified: Secondary | ICD-10-CM | POA: Diagnosis not present

## 2016-06-22 DIAGNOSIS — D3A00 Benign carcinoid tumor of unspecified site: Secondary | ICD-10-CM | POA: Diagnosis not present

## 2016-06-22 DIAGNOSIS — I10 Essential (primary) hypertension: Secondary | ICD-10-CM | POA: Diagnosis not present

## 2016-06-22 DIAGNOSIS — R911 Solitary pulmonary nodule: Secondary | ICD-10-CM | POA: Diagnosis not present

## 2016-06-22 DIAGNOSIS — R197 Diarrhea, unspecified: Secondary | ICD-10-CM | POA: Diagnosis not present

## 2016-06-22 DIAGNOSIS — Z79899 Other long term (current) drug therapy: Secondary | ICD-10-CM | POA: Diagnosis not present

## 2016-06-22 DIAGNOSIS — D3A098 Benign carcinoid tumors of other sites: Secondary | ICD-10-CM | POA: Diagnosis not present

## 2016-06-22 DIAGNOSIS — K869 Disease of pancreas, unspecified: Secondary | ICD-10-CM | POA: Diagnosis not present

## 2016-06-22 DIAGNOSIS — R1011 Right upper quadrant pain: Secondary | ICD-10-CM | POA: Diagnosis not present

## 2016-06-22 DIAGNOSIS — E119 Type 2 diabetes mellitus without complications: Secondary | ICD-10-CM | POA: Diagnosis not present

## 2016-06-22 DIAGNOSIS — R590 Localized enlarged lymph nodes: Secondary | ICD-10-CM | POA: Diagnosis not present

## 2016-06-22 DIAGNOSIS — Z9049 Acquired absence of other specified parts of digestive tract: Secondary | ICD-10-CM | POA: Diagnosis not present

## 2016-06-22 DIAGNOSIS — K639 Disease of intestine, unspecified: Secondary | ICD-10-CM | POA: Diagnosis not present

## 2016-06-26 DIAGNOSIS — G4733 Obstructive sleep apnea (adult) (pediatric): Secondary | ICD-10-CM | POA: Diagnosis not present

## 2016-07-21 DIAGNOSIS — Z5111 Encounter for antineoplastic chemotherapy: Secondary | ICD-10-CM | POA: Diagnosis not present

## 2016-07-21 DIAGNOSIS — D3A00 Benign carcinoid tumor of unspecified site: Secondary | ICD-10-CM | POA: Diagnosis not present

## 2016-07-27 DIAGNOSIS — G4733 Obstructive sleep apnea (adult) (pediatric): Secondary | ICD-10-CM | POA: Diagnosis not present

## 2016-08-12 ENCOUNTER — Ambulatory Visit (INDEPENDENT_AMBULATORY_CARE_PROVIDER_SITE_OTHER): Payer: Medicare Other | Admitting: Otolaryngology

## 2016-08-12 DIAGNOSIS — J343 Hypertrophy of nasal turbinates: Secondary | ICD-10-CM | POA: Diagnosis not present

## 2016-08-12 DIAGNOSIS — H9313 Tinnitus, bilateral: Secondary | ICD-10-CM

## 2016-08-12 DIAGNOSIS — J310 Chronic rhinitis: Secondary | ICD-10-CM | POA: Diagnosis not present

## 2016-08-12 DIAGNOSIS — H903 Sensorineural hearing loss, bilateral: Secondary | ICD-10-CM

## 2016-08-24 DIAGNOSIS — D3A00 Benign carcinoid tumor of unspecified site: Secondary | ICD-10-CM | POA: Diagnosis not present

## 2016-08-24 DIAGNOSIS — D3A019 Benign carcinoid tumor of the small intestine, unspecified portion: Secondary | ICD-10-CM | POA: Diagnosis not present

## 2016-08-24 DIAGNOSIS — Z5111 Encounter for antineoplastic chemotherapy: Secondary | ICD-10-CM | POA: Diagnosis not present

## 2016-08-24 DIAGNOSIS — K639 Disease of intestine, unspecified: Secondary | ICD-10-CM | POA: Diagnosis not present

## 2016-08-26 DIAGNOSIS — G4733 Obstructive sleep apnea (adult) (pediatric): Secondary | ICD-10-CM | POA: Diagnosis not present

## 2016-09-05 DIAGNOSIS — R197 Diarrhea, unspecified: Secondary | ICD-10-CM | POA: Diagnosis not present

## 2016-10-14 ENCOUNTER — Ambulatory Visit (INDEPENDENT_AMBULATORY_CARE_PROVIDER_SITE_OTHER): Payer: Medicare PPO | Admitting: Otolaryngology

## 2016-10-14 DIAGNOSIS — J310 Chronic rhinitis: Secondary | ICD-10-CM

## 2017-04-19 DIAGNOSIS — D3A098 Benign carcinoid tumors of other sites: Secondary | ICD-10-CM | POA: Diagnosis not present

## 2017-04-19 DIAGNOSIS — Z5111 Encounter for antineoplastic chemotherapy: Secondary | ICD-10-CM | POA: Diagnosis not present

## 2017-04-19 DIAGNOSIS — R590 Localized enlarged lymph nodes: Secondary | ICD-10-CM | POA: Diagnosis not present

## 2017-04-19 DIAGNOSIS — D3A00 Benign carcinoid tumor of unspecified site: Secondary | ICD-10-CM | POA: Diagnosis not present

## 2017-04-19 DIAGNOSIS — R1907 Generalized intra-abdominal and pelvic swelling, mass and lump: Secondary | ICD-10-CM | POA: Diagnosis not present

## 2017-05-17 DIAGNOSIS — K5900 Constipation, unspecified: Secondary | ICD-10-CM | POA: Diagnosis not present

## 2017-05-17 DIAGNOSIS — T40605A Adverse effect of unspecified narcotics, initial encounter: Secondary | ICD-10-CM | POA: Diagnosis not present

## 2017-05-17 DIAGNOSIS — L299 Pruritus, unspecified: Secondary | ICD-10-CM | POA: Diagnosis not present

## 2017-05-17 DIAGNOSIS — K639 Disease of intestine, unspecified: Secondary | ICD-10-CM | POA: Diagnosis not present

## 2017-05-17 DIAGNOSIS — Z5111 Encounter for antineoplastic chemotherapy: Secondary | ICD-10-CM | POA: Diagnosis not present

## 2017-05-17 DIAGNOSIS — D3A00 Benign carcinoid tumor of unspecified site: Secondary | ICD-10-CM | POA: Diagnosis not present

## 2017-05-17 DIAGNOSIS — G893 Neoplasm related pain (acute) (chronic): Secondary | ICD-10-CM | POA: Diagnosis not present

## 2017-05-17 DIAGNOSIS — R0609 Other forms of dyspnea: Secondary | ICD-10-CM | POA: Diagnosis not present

## 2017-06-14 DIAGNOSIS — Z5111 Encounter for antineoplastic chemotherapy: Secondary | ICD-10-CM | POA: Diagnosis not present

## 2017-06-14 DIAGNOSIS — D3A00 Benign carcinoid tumor of unspecified site: Secondary | ICD-10-CM | POA: Diagnosis not present

## 2017-06-14 DIAGNOSIS — Z79899 Other long term (current) drug therapy: Secondary | ICD-10-CM | POA: Diagnosis not present

## 2017-06-20 DIAGNOSIS — Z124 Encounter for screening for malignant neoplasm of cervix: Secondary | ICD-10-CM | POA: Diagnosis not present

## 2017-06-20 DIAGNOSIS — Z0000 Encounter for general adult medical examination without abnormal findings: Secondary | ICD-10-CM | POA: Diagnosis not present

## 2017-06-24 DIAGNOSIS — Z23 Encounter for immunization: Secondary | ICD-10-CM | POA: Diagnosis not present

## 2017-06-24 DIAGNOSIS — R079 Chest pain, unspecified: Secondary | ICD-10-CM | POA: Diagnosis not present

## 2017-06-24 DIAGNOSIS — R0609 Other forms of dyspnea: Secondary | ICD-10-CM | POA: Diagnosis not present

## 2017-06-24 DIAGNOSIS — R0600 Dyspnea, unspecified: Secondary | ICD-10-CM | POA: Diagnosis not present

## 2017-06-24 DIAGNOSIS — R062 Wheezing: Secondary | ICD-10-CM | POA: Diagnosis not present

## 2017-07-05 DIAGNOSIS — R05 Cough: Secondary | ICD-10-CM | POA: Diagnosis not present

## 2017-07-05 DIAGNOSIS — Z87891 Personal history of nicotine dependence: Secondary | ICD-10-CM | POA: Diagnosis not present

## 2017-07-05 DIAGNOSIS — R0609 Other forms of dyspnea: Secondary | ICD-10-CM | POA: Diagnosis not present

## 2017-07-05 DIAGNOSIS — R0602 Shortness of breath: Secondary | ICD-10-CM | POA: Diagnosis not present

## 2017-07-12 DIAGNOSIS — R911 Solitary pulmonary nodule: Secondary | ICD-10-CM | POA: Diagnosis not present

## 2017-07-12 DIAGNOSIS — Z5111 Encounter for antineoplastic chemotherapy: Secondary | ICD-10-CM | POA: Diagnosis not present

## 2017-07-12 DIAGNOSIS — D3A00 Benign carcinoid tumor of unspecified site: Secondary | ICD-10-CM | POA: Diagnosis not present

## 2017-07-12 DIAGNOSIS — K639 Disease of intestine, unspecified: Secondary | ICD-10-CM | POA: Diagnosis not present

## 2017-07-12 DIAGNOSIS — K6389 Other specified diseases of intestine: Secondary | ICD-10-CM | POA: Diagnosis not present

## 2017-07-13 DIAGNOSIS — R0609 Other forms of dyspnea: Secondary | ICD-10-CM | POA: Diagnosis not present

## 2017-07-15 DIAGNOSIS — R599 Enlarged lymph nodes, unspecified: Secondary | ICD-10-CM | POA: Diagnosis not present

## 2017-07-15 DIAGNOSIS — L309 Dermatitis, unspecified: Secondary | ICD-10-CM | POA: Diagnosis not present

## 2017-07-15 DIAGNOSIS — G2581 Restless legs syndrome: Secondary | ICD-10-CM | POA: Diagnosis not present

## 2017-07-18 DIAGNOSIS — D649 Anemia, unspecified: Secondary | ICD-10-CM | POA: Diagnosis not present

## 2017-07-18 DIAGNOSIS — E782 Mixed hyperlipidemia: Secondary | ICD-10-CM | POA: Diagnosis not present

## 2017-07-18 DIAGNOSIS — I10 Essential (primary) hypertension: Secondary | ICD-10-CM | POA: Diagnosis not present

## 2017-07-18 DIAGNOSIS — R7301 Impaired fasting glucose: Secondary | ICD-10-CM | POA: Diagnosis not present

## 2017-07-19 DIAGNOSIS — R87810 Cervical high risk human papillomavirus (HPV) DNA test positive: Secondary | ICD-10-CM | POA: Diagnosis not present

## 2017-07-19 DIAGNOSIS — N870 Mild cervical dysplasia: Secondary | ICD-10-CM | POA: Diagnosis not present

## 2017-07-19 DIAGNOSIS — R87610 Atypical squamous cells of undetermined significance on cytologic smear of cervix (ASC-US): Secondary | ICD-10-CM | POA: Diagnosis not present

## 2017-07-21 DIAGNOSIS — K219 Gastro-esophageal reflux disease without esophagitis: Secondary | ICD-10-CM | POA: Diagnosis not present

## 2017-07-21 DIAGNOSIS — C259 Malignant neoplasm of pancreas, unspecified: Secondary | ICD-10-CM | POA: Diagnosis not present

## 2017-07-21 DIAGNOSIS — R1011 Right upper quadrant pain: Secondary | ICD-10-CM | POA: Diagnosis not present

## 2017-07-21 DIAGNOSIS — R7301 Impaired fasting glucose: Secondary | ICD-10-CM | POA: Diagnosis not present

## 2017-07-21 DIAGNOSIS — D509 Iron deficiency anemia, unspecified: Secondary | ICD-10-CM | POA: Diagnosis not present

## 2017-07-21 DIAGNOSIS — F339 Major depressive disorder, recurrent, unspecified: Secondary | ICD-10-CM | POA: Diagnosis not present

## 2017-07-21 DIAGNOSIS — G4733 Obstructive sleep apnea (adult) (pediatric): Secondary | ICD-10-CM | POA: Diagnosis not present

## 2017-07-21 DIAGNOSIS — I10 Essential (primary) hypertension: Secondary | ICD-10-CM | POA: Diagnosis not present

## 2017-07-21 DIAGNOSIS — G629 Polyneuropathy, unspecified: Secondary | ICD-10-CM | POA: Diagnosis not present

## 2017-08-08 ENCOUNTER — Other Ambulatory Visit (HOSPITAL_COMMUNITY): Payer: Self-pay | Admitting: Internal Medicine

## 2017-08-08 DIAGNOSIS — Z1231 Encounter for screening mammogram for malignant neoplasm of breast: Secondary | ICD-10-CM

## 2017-08-09 DIAGNOSIS — K5903 Drug induced constipation: Secondary | ICD-10-CM | POA: Diagnosis not present

## 2017-08-09 DIAGNOSIS — K639 Disease of intestine, unspecified: Secondary | ICD-10-CM | POA: Diagnosis not present

## 2017-08-09 DIAGNOSIS — R0609 Other forms of dyspnea: Secondary | ICD-10-CM | POA: Diagnosis not present

## 2017-08-09 DIAGNOSIS — D3A00 Benign carcinoid tumor of unspecified site: Secondary | ICD-10-CM | POA: Diagnosis not present

## 2017-08-09 DIAGNOSIS — T40605A Adverse effect of unspecified narcotics, initial encounter: Secondary | ICD-10-CM | POA: Diagnosis not present

## 2017-08-09 DIAGNOSIS — L299 Pruritus, unspecified: Secondary | ICD-10-CM | POA: Diagnosis not present

## 2017-08-12 DIAGNOSIS — E1140 Type 2 diabetes mellitus with diabetic neuropathy, unspecified: Secondary | ICD-10-CM | POA: Diagnosis not present

## 2017-08-12 DIAGNOSIS — E782 Mixed hyperlipidemia: Secondary | ICD-10-CM | POA: Diagnosis not present

## 2017-08-12 DIAGNOSIS — R0609 Other forms of dyspnea: Secondary | ICD-10-CM | POA: Diagnosis not present

## 2017-08-12 DIAGNOSIS — I10 Essential (primary) hypertension: Secondary | ICD-10-CM | POA: Diagnosis not present

## 2017-08-12 DIAGNOSIS — K219 Gastro-esophageal reflux disease without esophagitis: Secondary | ICD-10-CM | POA: Diagnosis not present

## 2017-08-12 DIAGNOSIS — Z7951 Long term (current) use of inhaled steroids: Secondary | ICD-10-CM | POA: Diagnosis not present

## 2017-08-12 DIAGNOSIS — Z79899 Other long term (current) drug therapy: Secondary | ICD-10-CM | POA: Diagnosis not present

## 2017-08-12 DIAGNOSIS — J45990 Exercise induced bronchospasm: Secondary | ICD-10-CM | POA: Diagnosis not present

## 2017-08-12 DIAGNOSIS — R0600 Dyspnea, unspecified: Secondary | ICD-10-CM | POA: Insufficient documentation

## 2017-08-12 DIAGNOSIS — Z9989 Dependence on other enabling machines and devices: Secondary | ICD-10-CM | POA: Diagnosis not present

## 2017-08-12 DIAGNOSIS — G4733 Obstructive sleep apnea (adult) (pediatric): Secondary | ICD-10-CM | POA: Diagnosis not present

## 2017-08-16 DIAGNOSIS — K219 Gastro-esophageal reflux disease without esophagitis: Secondary | ICD-10-CM | POA: Diagnosis not present

## 2017-08-16 DIAGNOSIS — R7301 Impaired fasting glucose: Secondary | ICD-10-CM | POA: Diagnosis not present

## 2017-08-16 DIAGNOSIS — F339 Major depressive disorder, recurrent, unspecified: Secondary | ICD-10-CM | POA: Diagnosis not present

## 2017-08-16 DIAGNOSIS — G4733 Obstructive sleep apnea (adult) (pediatric): Secondary | ICD-10-CM | POA: Diagnosis not present

## 2017-08-16 DIAGNOSIS — D509 Iron deficiency anemia, unspecified: Secondary | ICD-10-CM | POA: Diagnosis not present

## 2017-08-16 DIAGNOSIS — N390 Urinary tract infection, site not specified: Secondary | ICD-10-CM | POA: Diagnosis not present

## 2017-08-16 DIAGNOSIS — G629 Polyneuropathy, unspecified: Secondary | ICD-10-CM | POA: Diagnosis not present

## 2017-08-16 DIAGNOSIS — F419 Anxiety disorder, unspecified: Secondary | ICD-10-CM | POA: Diagnosis not present

## 2017-08-16 DIAGNOSIS — R1011 Right upper quadrant pain: Secondary | ICD-10-CM | POA: Diagnosis not present

## 2017-08-16 DIAGNOSIS — C259 Malignant neoplasm of pancreas, unspecified: Secondary | ICD-10-CM | POA: Diagnosis not present

## 2017-08-18 ENCOUNTER — Ambulatory Visit (HOSPITAL_COMMUNITY)
Admission: RE | Admit: 2017-08-18 | Discharge: 2017-08-18 | Disposition: A | Discharge status: Home / Self Care | Payer: Medicare PPO | Source: Ambulatory Visit | Attending: Internal Medicine | Admitting: Internal Medicine

## 2017-08-18 ENCOUNTER — Encounter (HOSPITAL_COMMUNITY): Payer: Self-pay

## 2017-08-18 DIAGNOSIS — Z1231 Encounter for screening mammogram for malignant neoplasm of breast: Secondary | ICD-10-CM | POA: Insufficient documentation

## 2017-09-08 DIAGNOSIS — Z87891 Personal history of nicotine dependence: Secondary | ICD-10-CM | POA: Diagnosis not present

## 2017-09-08 DIAGNOSIS — D3A00 Benign carcinoid tumor of unspecified site: Secondary | ICD-10-CM | POA: Diagnosis not present

## 2017-09-26 DIAGNOSIS — M545 Low back pain: Secondary | ICD-10-CM | POA: Diagnosis not present

## 2017-09-26 DIAGNOSIS — M542 Cervicalgia: Secondary | ICD-10-CM | POA: Diagnosis not present

## 2017-09-26 DIAGNOSIS — Z6829 Body mass index (BMI) 29.0-29.9, adult: Secondary | ICD-10-CM | POA: Diagnosis not present

## 2017-09-30 ENCOUNTER — Other Ambulatory Visit: Payer: Self-pay | Admitting: Neurological Surgery

## 2017-09-30 DIAGNOSIS — M542 Cervicalgia: Secondary | ICD-10-CM

## 2017-09-30 DIAGNOSIS — M545 Low back pain: Secondary | ICD-10-CM

## 2017-10-11 ENCOUNTER — Encounter: Payer: Self-pay | Admitting: Internal Medicine

## 2017-10-11 DIAGNOSIS — M5489 Other dorsalgia: Secondary | ICD-10-CM | POA: Diagnosis not present

## 2017-10-11 DIAGNOSIS — R0600 Dyspnea, unspecified: Secondary | ICD-10-CM | POA: Diagnosis not present

## 2017-10-11 DIAGNOSIS — K639 Disease of intestine, unspecified: Secondary | ICD-10-CM | POA: Diagnosis not present

## 2017-10-11 DIAGNOSIS — K5900 Constipation, unspecified: Secondary | ICD-10-CM | POA: Diagnosis not present

## 2017-10-11 DIAGNOSIS — K5903 Drug induced constipation: Secondary | ICD-10-CM | POA: Diagnosis not present

## 2017-10-11 DIAGNOSIS — D3A00 Benign carcinoid tumor of unspecified site: Secondary | ICD-10-CM | POA: Diagnosis not present

## 2017-10-11 DIAGNOSIS — M542 Cervicalgia: Secondary | ICD-10-CM | POA: Diagnosis not present

## 2017-10-11 DIAGNOSIS — L299 Pruritus, unspecified: Secondary | ICD-10-CM | POA: Diagnosis not present

## 2017-10-12 ENCOUNTER — Ambulatory Visit
Admission: RE | Admit: 2017-10-12 | Discharge: 2017-10-12 | Disposition: A | Discharge status: Home / Self Care | Payer: PPO | Source: Ambulatory Visit | Attending: Neurological Surgery | Admitting: Neurological Surgery

## 2017-10-12 DIAGNOSIS — M545 Low back pain: Secondary | ICD-10-CM

## 2017-10-12 DIAGNOSIS — M4802 Spinal stenosis, cervical region: Secondary | ICD-10-CM | POA: Diagnosis not present

## 2017-10-12 DIAGNOSIS — M48061 Spinal stenosis, lumbar region without neurogenic claudication: Secondary | ICD-10-CM | POA: Diagnosis not present

## 2017-10-12 DIAGNOSIS — M542 Cervicalgia: Secondary | ICD-10-CM

## 2017-10-12 MED ORDER — GADOBENATE DIMEGLUMINE 529 MG/ML IV SOLN: 15.0000 mL | Freq: Once | INTRAVENOUS | Status: DC | PRN

## 2017-10-13 DIAGNOSIS — Z9049 Acquired absence of other specified parts of digestive tract: Secondary | ICD-10-CM | POA: Diagnosis not present

## 2017-10-13 DIAGNOSIS — K838 Other specified diseases of biliary tract: Secondary | ICD-10-CM | POA: Diagnosis not present

## 2017-10-13 DIAGNOSIS — Z90710 Acquired absence of both cervix and uterus: Secondary | ICD-10-CM | POA: Diagnosis not present

## 2017-10-13 DIAGNOSIS — D3A00 Benign carcinoid tumor of unspecified site: Secondary | ICD-10-CM | POA: Diagnosis not present

## 2017-10-13 DIAGNOSIS — Q613 Polycystic kidney, unspecified: Secondary | ICD-10-CM | POA: Diagnosis not present

## 2017-10-13 DIAGNOSIS — D3A012 Benign carcinoid tumor of the ileum: Secondary | ICD-10-CM | POA: Diagnosis not present

## 2017-10-18 ENCOUNTER — Other Ambulatory Visit: Payer: Self-pay | Admitting: Neurological Surgery

## 2017-10-18 DIAGNOSIS — M542 Cervicalgia: Secondary | ICD-10-CM | POA: Diagnosis not present

## 2017-10-18 DIAGNOSIS — G5602 Carpal tunnel syndrome, left upper limb: Secondary | ICD-10-CM | POA: Insufficient documentation

## 2017-10-18 DIAGNOSIS — G5603 Carpal tunnel syndrome, bilateral upper limbs: Secondary | ICD-10-CM | POA: Diagnosis not present

## 2017-10-18 DIAGNOSIS — M545 Low back pain: Secondary | ICD-10-CM | POA: Diagnosis not present

## 2017-10-18 DIAGNOSIS — Z6829 Body mass index (BMI) 29.0-29.9, adult: Secondary | ICD-10-CM | POA: Diagnosis not present

## 2017-11-08 DIAGNOSIS — Z5111 Encounter for antineoplastic chemotherapy: Secondary | ICD-10-CM | POA: Diagnosis not present

## 2017-11-08 DIAGNOSIS — D3A00 Benign carcinoid tumor of unspecified site: Secondary | ICD-10-CM | POA: Diagnosis not present

## 2017-11-08 DIAGNOSIS — Z79899 Other long term (current) drug therapy: Secondary | ICD-10-CM | POA: Diagnosis not present

## 2017-11-08 NOTE — Pre-Procedure Instructions (Signed)
ROSCHELLE CALANDRA  11/08/2017      [facility] - [place], [address] [place] [address] Phone: [phone] Fax: [phone]    Your procedure is scheduled on Wednesday March 6.  Report to [facility] Admitting at 7:30 A.M.  Call this number if you have problems the morning of surgery:  [phone]   Remember:  Do not eat food or drink liquids after midnight.  Take these medicines the morning of surgery with A SIP OF WATER:   Metoprolol (lopressor) Duloxetine (Cymbalta) Gabapentin (neurontin) Cyclobenzaprine (flexeril) if needed Albuterol if needed (please bring in haler to hospital with you) Hydrocodone-acetaminophen (norco) if needed Symbicort flonase if needed  7 days prior to surgery STOP taking any Aspirin(unless otherwise instructed by your surgeon), Aleve, Naproxen, Ibuprofen, Motrin, Advil, Goody's, BC's, all herbal medications, fish oil, and all vitamins     How to Manage Your Diabetes Before and After Surgery  Why is it important to control my blood sugar before and after surgery? . Improving blood sugar levels before and after surgery helps healing and can limit problems. . A way of improving blood sugar control is eating a healthy diet by: o  Eating less sugar and carbohydrates o  Increasing activity/exercise o  Talking with your doctor about reaching your blood sugar goals . High blood sugars (greater than 180 mg/dL) can raise your risk of infections and slow your recovery, so you will need to focus on controlling your diabetes during the weeks before surgery. . Make sure that the doctor who takes care of your diabetes knows about your planned surgery including the date and location.  How do I manage my blood sugar before surgery? . Check your blood sugar at least 4 times a day, starting 2 days before surgery, to make sure that the level is not too high or low. o Check your blood sugar the morning of your  surgery when you wake up and every 2 hours until you get to the Short Stay unit. . If your blood sugar is less than 70 mg/dL, you will need to treat for low blood sugar: o Do not take insulin. o Treat a low blood sugar (less than 70 mg/dL) with  cup of clear juice (cranberry or apple), 4 glucose tablets, OR glucose gel. Recheck blood sugar in 15 minutes after treatment (to make sure it is greater than 70 mg/dL). If your blood sugar is not greater than 70 mg/dL on recheck, call [phone] o  for further instructions. . Report your blood sugar to the short stay nurse when you get to Short Stay.  . If you are admitted to the hospital after surgery: o Your blood sugar will be checked by the staff and you will probably be given insulin after surgery (instead of oral diabetes medicines) to make sure you have good blood sugar levels. o The goal for blood sugar control after surgery is 80-180 mg/dL.              Do not wear jewelry, make-up or nail polish.  Do not wear lotions, powders, or perfumes, or deodorant.  Do not shave 48 hours prior to surgery.  Men may shave face and neck.  Do not bring valuables to the hospital.  [facility] is not responsible for any belongings or valuables.  Contacts, dentures or bridgework may not be worn into surgery.  Leave your suitcase in the car.  After surgery it  may be brought to your room.  For patients admitted to the hospital, discharge time will be determined by your treatment team.  Patients discharged the day of surgery will not be allowed to drive home.   Special instructions:    [facility]- Preparing For Surgery  Before surgery, you can play an important role. Because skin is not sterile, your skin needs to be as free of germs as possible. You can reduce the number of germs on your skin by washing with CHG (chlorahexidine gluconate) Soap before surgery.  CHG is an antiseptic cleaner which kills germs and bonds with the skin to continue  killing germs even after washing.  Please do not use if you have an allergy to CHG or antibacterial soaps. If your skin becomes reddened/irritated stop using the CHG.  Do not shave (including legs and underarms) for at least 48 hours prior to first CHG shower. It is OK to shave your face.  Please follow these instructions carefully.   1. Shower the NIGHT BEFORE SURGERY and the MORNING OF SURGERY with CHG.   2. If you chose to wash your hair, wash your hair first as usual with your normal shampoo.  3. After you shampoo, rinse your hair and body thoroughly to remove the shampoo.  4. Use CHG as you would any other liquid soap. You can apply CHG directly to the skin and wash gently with a scrungie or a clean washcloth.   5. Apply the CHG Soap to your body ONLY FROM THE NECK DOWN.  Do not use on open wounds or open sores. Avoid contact with your eyes, ears, mouth and genitals (private parts). Wash Face and genitals (private parts)  with your normal soap.  6. Wash thoroughly, paying special attention to the area where your surgery will be performed.  7. Thoroughly rinse your body with warm water from the neck down.  8. DO NOT shower/wash with your normal soap after using and rinsing off the CHG Soap.  9. Pat yourself dry with a CLEAN TOWEL.  10. Wear CLEAN PAJAMAS to bed the night before surgery, wear comfortable clothes the morning of surgery  11. Place CLEAN SHEETS on your bed the night of your first shower and DO NOT SLEEP WITH PETS.    Day of Surgery: Do not apply any deodorants/lotions. Please wear clean clothes to the hospital/surgery center.      Please read over the following fact sheets that you were given. Coughing and Deep Breathing, MRSA Information and Surgical Site Infection Prevention

## 2017-11-09 ENCOUNTER — Encounter (HOSPITAL_COMMUNITY)
Admission: RE | Admit: 2017-11-09 | Discharge: 2017-11-09 | Disposition: A | Discharge status: Home / Self Care | Payer: PPO | Source: Ambulatory Visit | Attending: Neurological Surgery | Admitting: Neurological Surgery

## 2017-11-09 ENCOUNTER — Ambulatory Visit (HOSPITAL_COMMUNITY)
Admission: RE | Admit: 2017-11-09 | Discharge: 2017-11-09 | Disposition: A | Discharge status: Home / Self Care | Payer: PPO | Source: Ambulatory Visit | Attending: Anesthesiology | Admitting: Anesthesiology

## 2017-11-09 ENCOUNTER — Other Ambulatory Visit: Payer: Self-pay

## 2017-11-09 ENCOUNTER — Encounter (HOSPITAL_COMMUNITY): Payer: Self-pay

## 2017-11-09 DIAGNOSIS — R9431 Abnormal electrocardiogram [ECG] [EKG]: Secondary | ICD-10-CM | POA: Diagnosis not present

## 2017-11-09 DIAGNOSIS — Z01812 Encounter for preprocedural laboratory examination: Secondary | ICD-10-CM | POA: Diagnosis not present

## 2017-11-09 DIAGNOSIS — M542 Cervicalgia: Secondary | ICD-10-CM | POA: Diagnosis not present

## 2017-11-09 DIAGNOSIS — Z01810 Encounter for preprocedural cardiovascular examination: Secondary | ICD-10-CM | POA: Diagnosis not present

## 2017-11-09 DIAGNOSIS — Z01818 Encounter for other preprocedural examination: Secondary | ICD-10-CM

## 2017-11-09 DIAGNOSIS — J45901 Unspecified asthma with (acute) exacerbation: Secondary | ICD-10-CM | POA: Diagnosis not present

## 2017-11-09 HISTORY — DX: Unspecified osteoarthritis, unspecified site: M19.90

## 2017-11-09 HISTORY — DX: Personal history of other diseases of the digestive system: Z87.19

## 2017-11-09 HISTORY — DX: Dyspnea, unspecified: R06.00

## 2017-11-09 HISTORY — DX: Depression, unspecified: F32.A

## 2017-11-09 HISTORY — DX: Major depressive disorder, single episode, unspecified: F32.9

## 2017-11-09 LAB — HEMOGLOBIN A1C
Hgb A1c MFr Bld: 6.1 % — ABNORMAL HIGH (ref 4.8–5.6)
Mean Plasma Glucose: 128.37 mg/dL

## 2017-11-09 LAB — BASIC METABOLIC PANEL
Anion gap: 10 (ref 5–15)
BUN: 10 mg/dL (ref 6–20)
CO2: 25 mmol/L (ref 22–32)
Calcium: 9.1 mg/dL (ref 8.9–10.3)
Chloride: 103 mmol/L (ref 101–111)
Creatinine, Ser: 0.82 mg/dL (ref 0.44–1.00)
GFR calc Af Amer: >60 mL/min (ref >60)
GFR calc non Af Amer: >60 mL/min (ref >60)
Glucose, Bld: 94 mg/dL (ref 65–99)
Potassium: 4 mmol/L (ref 3.5–5.1)
Sodium: 138 mmol/L (ref 135–145)

## 2017-11-09 LAB — CBC WITH DIFFERENTIAL/PLATELET
Basophils Absolute: 0.1 10*3/uL (ref 0.0–0.1)
Basophils Relative: 1 %
Eosinophils Absolute: 0.2 10*3/uL (ref 0.0–0.7)
Eosinophils Relative: 2 %
HCT: 38.4 % (ref 36.0–46.0)
Hemoglobin: 12.3 g/dL (ref 12.0–15.0)
Lymphocytes Relative: 26 %
Lymphs Abs: 2.8 10*3/uL (ref 0.7–4.0)
MCH: 29.5 pg (ref 26.0–34.0)
MCHC: 32 g/dL (ref 30.0–36.0)
MCV: 92.1 fL (ref 78.0–100.0)
Monocytes Absolute: 1.2 10*3/uL — ABNORMAL HIGH (ref 0.1–1.0)
Monocytes Relative: 11 %
Neutro Abs: 6.3 10*3/uL (ref 1.7–7.7)
Neutrophils Relative %: 60 %
Platelets: 333 10*3/uL (ref 150–400)
RBC: 4.17 MIL/uL (ref 3.87–5.11)
RDW: 13.3 % (ref 11.5–15.5)
WBC: 10.6 10*3/uL — ABNORMAL HIGH (ref 4.0–10.5)

## 2017-11-09 LAB — PROTIME-INR
INR: 1.03
Prothrombin Time: 13.4 seconds (ref 11.4–15.2)

## 2017-11-09 LAB — SURGICAL PCR SCREEN
MRSA, PCR: NEGATIVE
Staphylococcus aureus: NEGATIVE

## 2017-11-09 LAB — GLUCOSE, CAPILLARY: Glucose-Capillary: 133 mg/dL — ABNORMAL HIGH (ref 65–99)

## 2017-11-09 NOTE — Pre-Procedure Instructions (Signed)
Pamela Hatfield  11/09/2017      [facility] - [place], [address] [place] [address] Phone: [phone] Fax: [phone]    Your procedure is scheduled on Wednesday March 6.  Report to [facility] Admitting at 7:30 A.M.  Call this number if you have problems the morning of surgery:  [phone]   Remember:  Do not eat food or drink liquids after midnight.  Take these medicines the morning of surgery with A SIP OF WATER:   Metoprolol (lopressor) Duloxetine (Cymbalta) Gabapentin (neurontin) Cyclobenzaprine (flexeril) if needed Albuterol if needed (please bring in haler to hospital with you) Hydrocodone-acetaminophen (norco) if needed Symbicort Singulair  flonase if needed  7 days prior to surgery STOP taking any Aspirin(unless otherwise instructed by your surgeon), Aleve, Naproxen, Ibuprofen, Motrin, Advil, Goody's, BC's, all herbal medications, fish oil, and all vitamins     How to Manage Your Diabetes Before and After Surgery  Why is it important to control my blood sugar before and after surgery? . Improving blood sugar levels before and after surgery helps healing and can limit problems. . A way of improving blood sugar control is eating a healthy diet by: o  Eating less sugar and carbohydrates o  Increasing activity/exercise o  Talking with your doctor about reaching your blood sugar goals . High blood sugars (greater than 180 mg/dL) can raise your risk of infections and slow your recovery, so you will need to focus on controlling your diabetes during the weeks before surgery. . Make sure that the doctor who takes care of your diabetes knows about your planned surgery including the date and location.  How do I manage my blood sugar before surgery? . Check your blood sugar at least 4 times a day, starting 2 days before surgery, to make sure that the level is not too high or low. o Check your blood sugar the  morning of your surgery when you wake up and every 2 hours until you get to the Short Stay unit. . If your blood sugar is less than 70 mg/dL, you will need to treat for low blood sugar: o Treat a low blood sugar (less than 70 mg/dL) with  cup of clear juice (cranberry or apple), 4 glucose tablets, OR glucose gel. Recheck blood sugar in 15 minutes after treatment (to make sure it is greater than 70 mg/dL). If your blood sugar is not greater than 70 mg/dL on recheck, call [phone] o  for further instructions. . Report your blood sugar to the short stay nurse when you get to Short Stay.  . If you are admitted to the hospital after surgery: o Your blood sugar will be checked by the staff and you will probably be given insulin after surgery (instead of oral diabetes medicines) to make sure you have good blood sugar levels. o The goal for blood sugar control after surgery is 80-180 mg/dL.              Do not wear jewelry, make-up or nail polish.  Do not wear lotions, powders, or perfumes, or deodorant.  Do not shave 48 hours prior to surgery.  Men may shave face and neck.  Do not bring valuables to the hospital.  [facility] is not responsible for any belongings or valuables.  Contacts, dentures or bridgework may not be worn into surgery.  Leave your suitcase in the car.  After surgery it may be brought  to your room.  For patients admitted to the hospital, discharge time will be determined by your treatment team.  Patients discharged the day of surgery will not be allowed to drive home.   Special instructions:    [facility]- Preparing For Surgery  Before surgery, you can play an important role. Because skin is not sterile, your skin needs to be as free of germs as possible. You can reduce the number of germs on your skin by washing with CHG (chlorahexidine gluconate) Soap before surgery.  CHG is an antiseptic cleaner which kills germs and bonds with the skin to continue killing  germs even after washing.  Please do not use if you have an allergy to CHG or antibacterial soaps. If your skin becomes reddened/irritated stop using the CHG.  Do not shave (including legs and underarms) for at least 48 hours prior to first CHG shower. It is OK to shave your face.  Please follow these instructions carefully.   1. Shower the NIGHT BEFORE SURGERY and the MORNING OF SURGERY with CHG.   2. If you chose to wash your hair, wash your hair first as usual with your normal shampoo.  3. After you shampoo, rinse your hair and body thoroughly to remove the shampoo.  4. Use CHG as you would any other liquid soap. You can apply CHG directly to the skin and wash gently with a scrungie or a clean washcloth.   5. Apply the CHG Soap to your body ONLY FROM THE NECK DOWN.  Do not use on open wounds or open sores. Avoid contact with your eyes, ears, mouth and genitals (private parts). Wash Face and genitals (private parts)  with your normal soap.  6. Wash thoroughly, paying special attention to the area where your surgery will be performed.  7. Thoroughly rinse your body with warm water from the neck down.  8. DO NOT shower/wash with your normal soap after using and rinsing off the CHG Soap.  9. Pat yourself dry with a CLEAN TOWEL.  10. Wear CLEAN PAJAMAS to bed the night before surgery, wear comfortable clothes the morning of surgery  11. Place CLEAN SHEETS on your bed the night of your first shower and DO NOT SLEEP WITH PETS.    Day of Surgery: Do not apply any deodorants/lotions. Please wear clean clothes to the hospital/surgery center.      Please read over the following fact sheets that you were given. Coughing and Deep Breathing, MRSA Information and Surgical Site Infection Prevention

## 2017-11-09 NOTE — Progress Notes (Addendum)
Pt. Denies any flu like symptoms as well as chest concerns. Pt. rec'ing care at 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 She reports that surgery is not possible because of the location of the tumor but she says its been stable with the treatment of Sandostatin . Pt. Reported SOB at the end of 2018, stress test was done & she reports that it was not completed due to her last of stamina but no other form of  testing followed. PCP- Z. Hall in [place].

## 2017-11-15 NOTE — Anesthesia Preprocedure Evaluation (Addendum)
Anesthesia Evaluation  Patient identified by MRN, date of birth, ID band Patient awake    Reviewed: Allergy & Precautions, H&P , NPO status , Patient's Chart, lab work & pertinent test results, reviewed documented beta blocker date and time   Airway Mallampati: III  TM Distance: >3 FB Neck ROM: full    Dental no notable dental hx. (+) Teeth Intact, Dental Advisory Given   Pulmonary asthma , sleep apnea , former smoker,  Mild exercise induced asthma   Pulmonary exam normal breath sounds clear to auscultation       Cardiovascular Exercise Tolerance: Good hypertension, Pt. on home beta blockers and Pt. on medications Normal cardiovascular exam+ dysrhythmias  Rhythm:Regular Rate:Normal     Neuro/Psych Depression negative neurological ROS     GI/Hepatic Neg liver ROS, hiatal hernia, GERD  Medicated and Controlled,  Endo/Other  diabetes, Well Controlled, Type 2  Renal/GU negative Renal ROS     Musculoskeletal  (+) Fibromyalgia -  Abdominal   Peds  Hematology negative hematology ROS (+)   Anesthesia Other Findings   Reproductive/Obstetrics negative OB ROS                            Anesthesia Physical  Anesthesia Plan  ASA: III  Anesthesia Plan: General   Post-op Pain Management:    Induction: Intravenous  PONV Risk Score and Plan: 4 or greater and Treatment may vary due to age or medical condition, Ondansetron, Dexamethasone, Midazolam and Scopolamine patch - Pre-op  Airway Management Planned: Oral ETT and Video Laryngoscope Planned  Additional Equipment: None  Intra-op Plan:   Post-operative Plan: Extubation in OR  Informed Consent: I have reviewed the patients History and Physical, chart, labs and discussed the procedure including the risks, benefits and alternatives for the proposed anesthesia with the patient or authorized representative who has indicated his/her understanding  and acceptance.   Dental advisory given  Plan Discussed with: CRNA  Anesthesia Plan Comments:         Anesthesia Quick Evaluation

## 2017-11-16 ENCOUNTER — Observation Stay (HOSPITAL_COMMUNITY)
Admission: RE | Admit: 2017-11-16 | Discharge: 2017-11-17 | Disposition: A | Discharge status: Home / Self Care | Payer: PPO | Source: Ambulatory Visit | Attending: Neurological Surgery | Admitting: Neurological Surgery

## 2017-11-16 ENCOUNTER — Inpatient Hospital Stay (HOSPITAL_COMMUNITY): Payer: PPO | Admitting: Anesthesiology

## 2017-11-16 ENCOUNTER — Encounter (HOSPITAL_COMMUNITY): Payer: Self-pay

## 2017-11-16 ENCOUNTER — Observation Stay (HOSPITAL_COMMUNITY): Payer: PPO

## 2017-11-16 ENCOUNTER — Inpatient Hospital Stay (HOSPITAL_COMMUNITY): Payer: PPO | Admitting: Emergency Medicine

## 2017-11-16 ENCOUNTER — Ambulatory Visit (HOSPITAL_COMMUNITY)
Admission: RE | Disposition: A | Discharge status: Home / Self Care | Payer: Self-pay | Source: Ambulatory Visit | Attending: Neurological Surgery

## 2017-11-16 DIAGNOSIS — M4722 Other spondylosis with radiculopathy, cervical region: Secondary | ICD-10-CM | POA: Diagnosis not present

## 2017-11-16 DIAGNOSIS — Z85068 Personal history of other malignant neoplasm of small intestine: Secondary | ICD-10-CM | POA: Insufficient documentation

## 2017-11-16 DIAGNOSIS — J45990 Exercise induced bronchospasm: Secondary | ICD-10-CM | POA: Diagnosis not present

## 2017-11-16 DIAGNOSIS — I10 Essential (primary) hypertension: Secondary | ICD-10-CM | POA: Insufficient documentation

## 2017-11-16 DIAGNOSIS — Z885 Allergy status to narcotic agent status: Secondary | ICD-10-CM | POA: Diagnosis not present

## 2017-11-16 DIAGNOSIS — Z79899 Other long term (current) drug therapy: Secondary | ICD-10-CM | POA: Diagnosis not present

## 2017-11-16 DIAGNOSIS — R079 Chest pain, unspecified: Secondary | ICD-10-CM | POA: Diagnosis not present

## 2017-11-16 DIAGNOSIS — M50123 Cervical disc disorder at C6-C7 level with radiculopathy: Secondary | ICD-10-CM | POA: Diagnosis not present

## 2017-11-16 DIAGNOSIS — Z87891 Personal history of nicotine dependence: Secondary | ICD-10-CM | POA: Diagnosis not present

## 2017-11-16 DIAGNOSIS — Z7952 Long term (current) use of systemic steroids: Secondary | ICD-10-CM | POA: Diagnosis not present

## 2017-11-16 DIAGNOSIS — G4730 Sleep apnea, unspecified: Secondary | ICD-10-CM | POA: Insufficient documentation

## 2017-11-16 DIAGNOSIS — K219 Gastro-esophageal reflux disease without esophagitis: Secondary | ICD-10-CM | POA: Diagnosis not present

## 2017-11-16 DIAGNOSIS — M797 Fibromyalgia: Secondary | ICD-10-CM | POA: Diagnosis not present

## 2017-11-16 DIAGNOSIS — Z419 Encounter for procedure for purposes other than remedying health state, unspecified: Secondary | ICD-10-CM

## 2017-11-16 DIAGNOSIS — Z882 Allergy status to sulfonamides status: Secondary | ICD-10-CM | POA: Insufficient documentation

## 2017-11-16 DIAGNOSIS — M4322 Fusion of spine, cervical region: Secondary | ICD-10-CM | POA: Diagnosis present

## 2017-11-16 DIAGNOSIS — M4802 Spinal stenosis, cervical region: Secondary | ICD-10-CM | POA: Insufficient documentation

## 2017-11-16 DIAGNOSIS — Z888 Allergy status to other drugs, medicaments and biological substances status: Secondary | ICD-10-CM | POA: Insufficient documentation

## 2017-11-16 DIAGNOSIS — E119 Type 2 diabetes mellitus without complications: Secondary | ICD-10-CM | POA: Insufficient documentation

## 2017-11-16 DIAGNOSIS — M542 Cervicalgia: Secondary | ICD-10-CM | POA: Diagnosis not present

## 2017-11-16 DIAGNOSIS — Z79891 Long term (current) use of opiate analgesic: Secondary | ICD-10-CM | POA: Diagnosis not present

## 2017-11-16 DIAGNOSIS — Z8589 Personal history of malignant neoplasm of other organs and systems: Secondary | ICD-10-CM | POA: Insufficient documentation

## 2017-11-16 DIAGNOSIS — Z91048 Other nonmedicinal substance allergy status: Secondary | ICD-10-CM | POA: Diagnosis not present

## 2017-11-16 DIAGNOSIS — M47812 Spondylosis without myelopathy or radiculopathy, cervical region: Secondary | ICD-10-CM | POA: Diagnosis not present

## 2017-11-16 HISTORY — PX: ANTERIOR CERVICAL DECOMP/DISCECTOMY FUSION: SHX1161

## 2017-11-16 LAB — GLUCOSE, CAPILLARY
Glucose-Capillary: 104 mg/dL — ABNORMAL HIGH (ref 65–99)
Glucose-Capillary: 142 mg/dL — ABNORMAL HIGH (ref 65–99)
Glucose-Capillary: 184 mg/dL — ABNORMAL HIGH (ref 65–99)
Glucose-Capillary: 198 mg/dL — ABNORMAL HIGH (ref 65–99)

## 2017-11-16 SURGERY — ANTERIOR CERVICAL DECOMPRESSION/DISCECTOMY FUSION 3 LEVELS
Anesthesia: General | Site: Spine Cervical

## 2017-11-16 MED ORDER — METHOCARBAMOL 500 MG PO TABS
ORAL_TABLET | ORAL | Status: AC
  Filled 2017-11-16: qty 1

## 2017-11-16 MED ORDER — ROCURONIUM BROMIDE 50 MG/5ML IV SOLN
INTRAVENOUS | Status: AC
  Filled 2017-11-16: qty 1

## 2017-11-16 MED ORDER — DEXAMETHASONE SODIUM PHOSPHATE 10 MG/ML IJ SOLN
INTRAMUSCULAR | Status: AC
  Filled 2017-11-16: qty 1

## 2017-11-16 MED ORDER — OXYCODONE HCL 5 MG/5ML PO SOLN: 5.0000 mg | Freq: Once | ORAL | Status: AC | PRN

## 2017-11-16 MED ORDER — ONDANSETRON HCL 4 MG/2ML IJ SOLN: 4.0000 mg | Freq: Four times a day (QID) | INTRAMUSCULAR | Status: DC | PRN

## 2017-11-16 MED ORDER — SUGAMMADEX SODIUM 200 MG/2ML IV SOLN
INTRAVENOUS | Status: DC | PRN
  Administered 2017-11-16: 200 mg via INTRAVENOUS

## 2017-11-16 MED ORDER — MONTELUKAST SODIUM 10 MG PO TABS
10.0000 mg | ORAL_TABLET | Freq: Every day | ORAL | Status: DC
  Filled 2017-11-16: qty 1

## 2017-11-16 MED ORDER — SCOPOLAMINE 1 MG/3DAYS TD PT72
MEDICATED_PATCH | TRANSDERMAL | Status: AC
  Filled 2017-11-16: qty 1

## 2017-11-16 MED ORDER — 0.9 % SODIUM CHLORIDE (POUR BTL) OPTIME
TOPICAL | Status: DC | PRN
  Administered 2017-11-16: 1000 mL

## 2017-11-16 MED ORDER — PROCHLORPERAZINE MALEATE 5 MG PO TABS: 5.0000 mg | ORAL_TABLET | Freq: Four times a day (QID) | ORAL | Status: DC | PRN

## 2017-11-16 MED ORDER — HYDROXYZINE HCL 10 MG PO TABS: 10.0000 mg | ORAL_TABLET | Freq: Three times a day (TID) | ORAL | Status: DC | PRN

## 2017-11-16 MED ORDER — ENALAPRIL MALEATE 10 MG PO TABS
10.0000 mg | ORAL_TABLET | Freq: Every day | ORAL | Status: DC
  Filled 2017-11-16 (×2): qty 1

## 2017-11-16 MED ORDER — SENNA 8.6 MG PO TABS
1.0000 | ORAL_TABLET | Freq: Two times a day (BID) | ORAL | Status: DC
  Administered 2017-11-16: 8.6 mg via ORAL
  Filled 2017-11-16: qty 1

## 2017-11-16 MED ORDER — ONDANSETRON HCL 4 MG/2ML IJ SOLN
INTRAMUSCULAR | Status: DC | PRN
  Administered 2017-11-16: 4 mg via INTRAVENOUS

## 2017-11-16 MED ORDER — LIDOCAINE 2% (20 MG/ML) 5 ML SYRINGE
INTRAMUSCULAR | Status: DC | PRN
  Administered 2017-11-16: 100 mg via INTRAVENOUS

## 2017-11-16 MED ORDER — ACETAMINOPHEN 650 MG RE SUPP: 650.0000 mg | RECTAL | Status: DC | PRN

## 2017-11-16 MED ORDER — CEFAZOLIN SODIUM-DEXTROSE 2-4 GM/100ML-% IV SOLN
2.0000 g | Freq: Three times a day (TID) | INTRAVENOUS | Status: AC
  Administered 2017-11-16 – 2017-11-17 (×2): 2 g via INTRAVENOUS
  Filled 2017-11-16 (×2): qty 100

## 2017-11-16 MED ORDER — MIDAZOLAM HCL 2 MG/2ML IJ SOLN
INTRAMUSCULAR | Status: AC
Start: 2017-11-16 — End: 2017-11-16
  Filled 2017-11-16: qty 2

## 2017-11-16 MED ORDER — ONDANSETRON HCL 4 MG/2ML IJ SOLN: 4.0000 mg | Freq: Once | INTRAMUSCULAR | Status: DC | PRN

## 2017-11-16 MED ORDER — GABAPENTIN 600 MG PO TABS
600.0000 mg | ORAL_TABLET | Freq: Three times a day (TID) | ORAL | Status: DC
  Administered 2017-11-16 (×2): 600 mg via ORAL
  Filled 2017-11-16 (×2): qty 1

## 2017-11-16 MED ORDER — LIDOCAINE 2% (20 MG/ML) 5 ML SYRINGE
INTRAMUSCULAR | Status: AC
  Filled 2017-11-16: qty 5

## 2017-11-16 MED ORDER — PHENYLEPHRINE 40 MCG/ML (10ML) SYRINGE FOR IV PUSH (FOR BLOOD PRESSURE SUPPORT)
PREFILLED_SYRINGE | INTRAVENOUS | Status: DC | PRN
  Administered 2017-11-16 (×2): 80 ug via INTRAVENOUS

## 2017-11-16 MED ORDER — SCOPOLAMINE 1 MG/3DAYS TD PT72
MEDICATED_PATCH | TRANSDERMAL | Status: DC | PRN
  Administered 2017-11-16: 1 via TRANSDERMAL

## 2017-11-16 MED ORDER — THROMBIN 5000 UNITS EX SOLR
CUTANEOUS | Status: AC
  Filled 2017-11-16: qty 5000

## 2017-11-16 MED ORDER — PROPOFOL 10 MG/ML IV BOLUS
INTRAVENOUS | Status: AC
Start: 2017-11-16 — End: 2017-11-16
  Filled 2017-11-16: qty 40

## 2017-11-16 MED ORDER — SODIUM CHLORIDE 0.9% FLUSH
3.0000 mL | Freq: Two times a day (BID) | INTRAVENOUS | Status: DC
  Administered 2017-11-16: 3 mL via INTRAVENOUS

## 2017-11-16 MED ORDER — CEFAZOLIN SODIUM-DEXTROSE 2-4 GM/100ML-% IV SOLN
INTRAVENOUS | Status: AC
  Filled 2017-11-16: qty 100

## 2017-11-16 MED ORDER — DEXAMETHASONE SODIUM PHOSPHATE 10 MG/ML IJ SOLN
10.0000 mg | INTRAMUSCULAR | Status: AC
  Administered 2017-11-16: 10 mg via INTRAVENOUS

## 2017-11-16 MED ORDER — CEFAZOLIN SODIUM-DEXTROSE 2-4 GM/100ML-% IV SOLN
2.0000 g | INTRAVENOUS | Status: AC
  Administered 2017-11-16: 2 g via INTRAVENOUS

## 2017-11-16 MED ORDER — FENTANYL CITRATE (PF) 250 MCG/5ML IJ SOLN
INTRAMUSCULAR | Status: AC
  Filled 2017-11-16: qty 5

## 2017-11-16 MED ORDER — FLUTICASONE PROPIONATE 50 MCG/ACT NA SUSP: 1.0000 | Freq: Every day | NASAL | Status: DC | PRN

## 2017-11-16 MED ORDER — ONDANSETRON HCL 4 MG PO TABS: 4.0000 mg | ORAL_TABLET | Freq: Four times a day (QID) | ORAL | Status: DC | PRN

## 2017-11-16 MED ORDER — THROMBIN (RECOMBINANT) 5000 UNITS EX SOLR
OROMUCOSAL | Status: DC | PRN
  Administered 2017-11-16 (×2): 5 mL via TOPICAL

## 2017-11-16 MED ORDER — ALBUTEROL SULFATE HFA 108 (90 BASE) MCG/ACT IN AERS: 2.0000 | INHALATION_SPRAY | Freq: Four times a day (QID) | RESPIRATORY_TRACT | Status: DC | PRN

## 2017-11-16 MED ORDER — METHOCARBAMOL 1000 MG/10ML IJ SOLN: 500.0000 mg | Freq: Four times a day (QID) | INTRAVENOUS | Status: DC | PRN

## 2017-11-16 MED ORDER — BUPIVACAINE HCL (PF) 0.25 % IJ SOLN
INTRAMUSCULAR | Status: DC | PRN
Start: 2017-11-16 — End: 2017-11-16
  Administered 2017-11-16: 5 mL

## 2017-11-16 MED ORDER — LISINOPRIL 20 MG PO TABS: 20.0000 mg | ORAL_TABLET | Freq: Every day | ORAL | Status: DC

## 2017-11-16 MED ORDER — ACETAMINOPHEN 325 MG PO TABS: 650.0000 mg | ORAL_TABLET | ORAL | Status: DC | PRN

## 2017-11-16 MED ORDER — OXYCODONE HCL 5 MG PO TABS
ORAL_TABLET | ORAL | Status: AC
  Filled 2017-11-16: qty 1

## 2017-11-16 MED ORDER — THROMBIN 20000 UNITS EX SOLR
CUTANEOUS | Status: AC
Start: 2017-11-16 — End: 2017-11-16
  Filled 2017-11-16: qty 20000

## 2017-11-16 MED ORDER — FENTANYL CITRATE (PF) 250 MCG/5ML IJ SOLN
INTRAMUSCULAR | Status: DC | PRN
  Administered 2017-11-16: 100 ug via INTRAVENOUS
  Administered 2017-11-16 (×3): 50 ug via INTRAVENOUS

## 2017-11-16 MED ORDER — SODIUM CHLORIDE 0.9% FLUSH: 3.0000 mL | INTRAVENOUS | Status: DC | PRN

## 2017-11-16 MED ORDER — PHENYLEPHRINE HCL 10 MG/ML IJ SOLN
INTRAVENOUS | Status: DC | PRN
  Administered 2017-11-16: 30 ug/min via INTRAVENOUS

## 2017-11-16 MED ORDER — CHLORHEXIDINE GLUCONATE CLOTH 2 % EX PADS: 6.0000 | MEDICATED_PAD | Freq: Once | CUTANEOUS | Status: DC

## 2017-11-16 MED ORDER — ROCURONIUM BROMIDE 100 MG/10ML IV SOLN
INTRAVENOUS | Status: DC | PRN
  Administered 2017-11-16: 50 mg via INTRAVENOUS

## 2017-11-16 MED ORDER — CYCLOBENZAPRINE HCL 5 MG PO TABS: 5.0000 mg | ORAL_TABLET | Freq: Three times a day (TID) | ORAL | Status: DC | PRN

## 2017-11-16 MED ORDER — PANTOPRAZOLE SODIUM 40 MG PO TBEC
40.0000 mg | DELAYED_RELEASE_TABLET | Freq: Every day | ORAL | Status: DC
  Administered 2017-11-16: 40 mg via ORAL
  Filled 2017-11-16: qty 1

## 2017-11-16 MED ORDER — MOMETASONE FURO-FORMOTEROL FUM 100-5 MCG/ACT IN AERO
2.0000 | INHALATION_SPRAY | Freq: Two times a day (BID) | RESPIRATORY_TRACT | Status: DC
  Filled 2017-11-16: qty 8.8

## 2017-11-16 MED ORDER — LACTATED RINGERS IV SOLN
INTRAVENOUS | Status: DC
  Administered 2017-11-16 (×2): via INTRAVENOUS

## 2017-11-16 MED ORDER — METHOCARBAMOL 500 MG PO TABS
500.0000 mg | ORAL_TABLET | Freq: Four times a day (QID) | ORAL | Status: DC | PRN
  Administered 2017-11-16 – 2017-11-17 (×3): 500 mg via ORAL
  Filled 2017-11-16 (×2): qty 1

## 2017-11-16 MED ORDER — FENTANYL CITRATE (PF) 100 MCG/2ML IJ SOLN
INTRAMUSCULAR | Status: AC
  Filled 2017-11-16: qty 2

## 2017-11-16 MED ORDER — POTASSIUM CHLORIDE IN NACL 20-0.9 MEQ/L-% IV SOLN: INTRAVENOUS | Status: DC

## 2017-11-16 MED ORDER — BACITRACIN 50000 UNITS IM SOLR
INTRAMUSCULAR | Status: DC | PRN
  Administered 2017-11-16: 500 mL

## 2017-11-16 MED ORDER — MORPHINE SULFATE (PF) 4 MG/ML IV SOLN: 1.0000 mg | INTRAVENOUS | Status: DC | PRN

## 2017-11-16 MED ORDER — DULOXETINE HCL 30 MG PO CPEP: 30.0000 mg | ORAL_CAPSULE | Freq: Every day | ORAL | Status: DC

## 2017-11-16 MED ORDER — METOPROLOL TARTRATE 25 MG PO TABS
50.0000 mg | ORAL_TABLET | Freq: Two times a day (BID) | ORAL | Status: DC
  Administered 2017-11-16: 50 mg via ORAL
  Filled 2017-11-16: qty 2

## 2017-11-16 MED ORDER — PHENOL 1.4 % MT LIQD
1.0000 | OROMUCOSAL | Status: DC | PRN
  Filled 2017-11-16: qty 177

## 2017-11-16 MED ORDER — HEMOSTATIC AGENTS (NO CHARGE) OPTIME
TOPICAL | Status: DC | PRN
  Administered 2017-11-16: 1 via TOPICAL

## 2017-11-16 MED ORDER — SODIUM CHLORIDE 0.9 % IV SOLN: 250.0000 mL | INTRAVENOUS | Status: DC

## 2017-11-16 MED ORDER — HYDROCODONE-ACETAMINOPHEN 10-325 MG PO TABS
1.0000 | ORAL_TABLET | ORAL | Status: DC | PRN
  Administered 2017-11-16 – 2017-11-17 (×3): 2 via ORAL
  Administered 2017-11-17: 1 via ORAL
  Filled 2017-11-16 (×2): qty 2
  Filled 2017-11-16: qty 1
  Filled 2017-11-16: qty 2

## 2017-11-16 MED ORDER — BUPIVACAINE HCL (PF) 0.25 % IJ SOLN
INTRAMUSCULAR | Status: AC
  Filled 2017-11-16: qty 30

## 2017-11-16 MED ORDER — OXYCODONE HCL 5 MG PO TABS
5.0000 mg | ORAL_TABLET | Freq: Once | ORAL | Status: AC | PRN
  Administered 2017-11-16: 5 mg via ORAL

## 2017-11-16 MED ORDER — HYDROCODONE-ACETAMINOPHEN 10-325 MG PO TABS
1.0000 | ORAL_TABLET | ORAL | Status: DC
  Administered 2017-11-16: 1 via ORAL
  Filled 2017-11-16: qty 1

## 2017-11-16 MED ORDER — PROPOFOL 10 MG/ML IV BOLUS
INTRAVENOUS | Status: DC | PRN
  Administered 2017-11-16: 180 mg via INTRAVENOUS

## 2017-11-16 MED ORDER — MENTHOL 3 MG MT LOZG
1.0000 | LOZENGE | OROMUCOSAL | Status: DC | PRN
  Filled 2017-11-16: qty 9

## 2017-11-16 MED ORDER — MIDAZOLAM HCL 2 MG/2ML IJ SOLN
INTRAMUSCULAR | Status: DC | PRN
  Administered 2017-11-16: 2 mg via INTRAVENOUS

## 2017-11-16 MED ORDER — FENTANYL CITRATE (PF) 100 MCG/2ML IJ SOLN
25.0000 ug | INTRAMUSCULAR | Status: DC | PRN
  Administered 2017-11-16 (×2): 50 ug via INTRAVENOUS

## 2017-11-16 SURGICAL SUPPLY — 62 items
ADH SKN CLS APL DERMABOND .7 (GAUZE/BANDAGES/DRESSINGS) ×1
APL SKNCLS STERI-STRIP NONHPOA (GAUZE/BANDAGES/DRESSINGS) ×1
BAG DECANTER FOR FLEXI CONT (MISCELLANEOUS) ×2 IMPLANT
BASKET BONE COLLECTION (BASKET) ×1 IMPLANT
BENZOIN TINCTURE PRP APPL 2/3 (GAUZE/BANDAGES/DRESSINGS) ×2 IMPLANT
BIT DRILL 13 (BIT) ×1 IMPLANT
BUR MATCHSTICK NEURO 3.0 LAGG (BURR) ×2 IMPLANT
CANISTER SUCT 3000ML PPV (MISCELLANEOUS) ×2 IMPLANT
CARTRIDGE OIL MAESTRO DRILL (MISCELLANEOUS) ×1 IMPLANT
CLSR STERI-STRIP ANTIMIC 1/2X4 (GAUZE/BANDAGES/DRESSINGS) ×1 IMPLANT
DERMABOND ADVANCED (GAUZE/BANDAGES/DRESSINGS) ×1
DERMABOND ADVANCED .7 DNX12 (GAUZE/BANDAGES/DRESSINGS) IMPLANT
DIFFUSER DRILL AIR PNEUMATIC (MISCELLANEOUS) ×2 IMPLANT
DRAPE C-ARM 42X72 X-RAY (DRAPES) ×4 IMPLANT
DRAPE LAPAROTOMY 100X72 PEDS (DRAPES) ×2 IMPLANT
DRAPE MICROSCOPE LEICA (MISCELLANEOUS) ×2 IMPLANT
DRAPE POUCH INSTRU U-SHP 10X18 (DRAPES) ×2 IMPLANT
DRSG OPSITE POSTOP 3X4 (GAUZE/BANDAGES/DRESSINGS) ×1 IMPLANT
DURAPREP 6ML APPLICATOR 50/CS (WOUND CARE) ×2 IMPLANT
ELECT COATED BLADE 2.86 ST (ELECTRODE) ×2 IMPLANT
ELECT REM PT RETURN 9FT ADLT (ELECTROSURGICAL) ×2
ELECTRODE REM PT RTRN 9FT ADLT (ELECTROSURGICAL) ×1 IMPLANT
GAUZE SPONGE 4X4 16PLY XRAY LF (GAUZE/BANDAGES/DRESSINGS) IMPLANT
GLOVE BIO SURGEON STRL SZ7 (GLOVE) ×1 IMPLANT
GLOVE BIO SURGEON STRL SZ8 (GLOVE) ×4 IMPLANT
GLOVE BIOGEL PI IND STRL 6.5 (GLOVE) IMPLANT
GLOVE BIOGEL PI IND STRL 7.0 (GLOVE) IMPLANT
GLOVE BIOGEL PI IND STRL 7.5 (GLOVE) IMPLANT
GLOVE BIOGEL PI INDICATOR 6.5 (GLOVE) ×1
GLOVE BIOGEL PI INDICATOR 7.0 (GLOVE) ×1
GLOVE BIOGEL PI INDICATOR 7.5 (GLOVE) ×4
GLOVE SURG SS PI 7.5 STRL IVOR (GLOVE) ×4 IMPLANT
GOWN STRL REUS W/ TWL LRG LVL3 (GOWN DISPOSABLE) IMPLANT
GOWN STRL REUS W/ TWL XL LVL3 (GOWN DISPOSABLE) ×1 IMPLANT
GOWN STRL REUS W/TWL 2XL LVL3 (GOWN DISPOSABLE) IMPLANT
GOWN STRL REUS W/TWL LRG LVL3 (GOWN DISPOSABLE) ×4
GOWN STRL REUS W/TWL XL LVL3 (GOWN DISPOSABLE) ×8
HEMOSTAT POWDER KIT SURGIFOAM (HEMOSTASIS) ×3 IMPLANT
KIT BASIN OR (CUSTOM PROCEDURE TRAY) ×2 IMPLANT
KIT ROOM TURNOVER OR (KITS) ×2 IMPLANT
NDL HYPO 25X1 1.5 SAFETY (NEEDLE) ×1 IMPLANT
NDL SPNL 20GX3.5 QUINCKE YW (NEEDLE) ×1 IMPLANT
NEEDLE HYPO 25X1 1.5 SAFETY (NEEDLE) ×2 IMPLANT
NEEDLE SPNL 20GX3.5 QUINCKE YW (NEEDLE) ×2 IMPLANT
NS IRRIG 1000ML POUR BTL (IV SOLUTION) ×2 IMPLANT
OIL CARTRIDGE MAESTRO DRILL (MISCELLANEOUS) ×2
PACK LAMINECTOMY NEURO (CUSTOM PROCEDURE TRAY) ×2 IMPLANT
PAD ARMBOARD 7.5X6 YLW CONV (MISCELLANEOUS) ×2 IMPLANT
PIN DISTRACTION 14MM (PIN) ×4 IMPLANT
PLATE 3 55XLCK NS SPNE CVD (Plate) IMPLANT
PLATE 3 ATLANTIS TRANS (Plate) ×2 IMPLANT
RUBBERBAND STERILE (MISCELLANEOUS) ×4 IMPLANT
SCREW ST FIX 4 ATL 3120213 (Screw) ×8 IMPLANT
SPACER PTI-C 6X16X14 7DEG (Spacer) ×3 IMPLANT
SPONGE INTESTINAL PEANUT (DISPOSABLE) ×2 IMPLANT
SPONGE SURGIFOAM ABS GEL 100 (HEMOSTASIS) ×2 IMPLANT
STRIP CLOSURE SKIN 1/2X4 (GAUZE/BANDAGES/DRESSINGS) ×2 IMPLANT
SUT VIC AB 3-0 FS2 27 (SUTURE) ×1 IMPLANT
SUT VIC AB 3-0 SH 8-18 (SUTURE) ×3 IMPLANT
TOWEL GREEN STERILE (TOWEL DISPOSABLE) ×2 IMPLANT
TOWEL GREEN STERILE FF (TOWEL DISPOSABLE) ×2 IMPLANT
WATER STERILE IRR 1000ML POUR (IV SOLUTION) ×2 IMPLANT

## 2017-11-16 NOTE — Transfer of Care (Signed)
Immediate Anesthesia Transfer of Care Note  Patient: Pamela Hatfield  Procedure(s) Performed: Anterior Cervical Decompression/Discectomy Fusion - Cervical four-Cervical five - Cervical five-Cervical six - Cervical six-Cervical seven (N/A Spine Cervical)  Patient Location: PACU  Anesthesia Type:General  Level of Consciousness: awake and alert   Airway & Oxygen Therapy: Patient Spontanous Breathing and Patient connected to nasal cannula oxygen  Post-op Assessment: Report given to RN and Post -op Vital signs reviewed and stable  Post vital signs: Reviewed and stable  Last Vitals:  Vitals:   11/16/17 0746 11/16/17 1237  BP: (!) 149/74 (!) 159/98  Pulse: 72 94  Resp: 20 (!) 6  Temp: 36.6 C 36.8 C  SpO2: 100% 96%    Last Pain:  Vitals:   11/16/17 1237  TempSrc:   PainSc: Asleep         Complications: No apparent anesthesia complications

## 2017-11-16 NOTE — Anesthesia Procedure Notes (Signed)
Procedure Name: Intubation Date/Time: 11/16/2017 9:45 AM Performed by: Valda Favia, CRNA Pre-anesthesia Checklist: Patient identified, Emergency Drugs available, Suction available, Patient being monitored and Timeout performed Patient Re-evaluated:Patient Re-evaluated prior to induction Oxygen Delivery Method: Circle system utilized Preoxygenation: Pre-oxygenation with 100% oxygen Induction Type: IV induction Ventilation: Mask ventilation without difficulty and Oral airway inserted - appropriate to patient size Laryngoscope Size: Glidescope and 4 Grade View: Grade I Tube type: Oral Tube size: 7.0 mm Number of attempts: 1 Airway Equipment and Method: Video-laryngoscopy Placement Confirmation: ETT inserted through vocal cords under direct vision,  positive ETCO2 and breath sounds checked- equal and bilateral Secured at: 20 cm Tube secured with: Tape Dental Injury: Teeth and Oropharynx as per pre-operative assessment  Comments: Elective video laryngoscopy due to limited neck ROM, neck instability, small oral opening, and short TMD.

## 2017-11-16 NOTE — Op Note (Signed)
11/16/2017  12:25 PM  PATIENT:  Pamela Hatfield  67 y.o. female  PRE-OPERATIVE DIAGNOSIS:  Cervical spondylosis with cervical spinal stenosis, neck pain with radiculopathy  POST-OPERATIVE DIAGNOSIS:  same  PROCEDURE:  1. Decompressive anterior cervical discectomy C4-5 C5-6 C6-7, 2. Anterior cervical arthrodesis C4-5 C5-6 C6-7 utilizing a porous titanium interbody cage packed with locally harvested morcellized autologous bone graft, 3. Anterior cervical plating C4-C7 inclusive utilizing a Atlantis translational plate  SURGEON:  Sherley Bounds, MD  ASSISTANTS: Dr. Cyndy Freeze  ANESTHESIA:   General  EBL: Less than 50 ml  Total I/O In: 1000 [I.V.:1000] Out: -   BLOOD ADMINISTERED: none  DRAINS: none  SPECIMEN:  none  INDICATION FOR PROCEDURE: This patient presented with severe neck pain with left arm pain. Imaging showed multilevel cervical spondylosis. The patient tried conservative measures without relief. Pain was debilitating. Recommended ACDF with plating. Patient understood the risks, benefits, and alternatives and potential outcomes and wished to proceed.  PROCEDURE DETAILS: Patient was brought to the operating room placed under general endotracheal anesthesia. Patient was placed in the supine position on the operating room table. The neck was prepped with Duraprep and draped in a sterile fashion.   Three cc of local anesthesia was injected and a transverse incision was made on the right side of the neck.  Dissection was carried down thru the subcutaneous tissue and the platysma was  elevated, opened, and undermined with Metzenbaum scissors.  Dissection was then carried out thru an avascular plane leaving the sternocleidomastoid carotid artery and jugular vein laterally and the trachea and esophagus medially. The ventral aspect of the vertebral column was identified and a localizing x-ray was taken. The C4-5 level was identified. The longus colli muscles were then elevated and the  retractor was placed to expose C4-5 C5-6 and C6-7. The annulus was incised at each of the 3 levels and the disc space entered. Discectomy was performed with micro-curettes and pituitary rongeurs and the exact same decompression was performed at each of the 3 levels. I then used the high-speed drill to drill the endplates down to the level of the posterior longitudinal ligament. The drill shavings were saved in a mucous trap for later arthrodesis. The operating microscope was draped and brought into the field provided additional magnification, illumination and visualization. Discectomy was continued posteriorly thru the disc space. Posterior longitudinal ligament was opened with a nerve hook, and then removed along with disc herniation and osteophytes, decompressing the spinal canal and thecal sac. We then continued to remove osteophytic overgrowth and disc material decompressing the neural foramina and exiting nerve roots bilaterally. Particular attention was placed on the left-hand side. The scope was angled up and down to help decompress and undercut the vertebral bodies. Once the decompression was completed we could pass a nerve hook circumferentially to assure adequate decompression in the midline and in the neural foramina. So by both visualization and palpation we felt we had an adequate decompression of the neural elements. We then measured the height of the intravertebral disc space and selected a 6 millimeter PTi interbody cage packed with autograft 40 each level. It was then gently positioned in the intravertebral disc space(s) and countersunk. I then used a Atlantis translational plate and placed fixed angle screws into the vertebral bodies of each level from C4-C7 inclusive and locked them into position. The wound was irrigated with bacitracin solution, checked for hemostasis which was established and confirmed. Once meticulous hemostasis was achieved, we then proceeded with closure. The platysma  was  closed with interrupted 3-0 undyed Vicryl suture, the subcuticular layer was closed with interrupted 3-0 undyed Vicryl suture. The skin edges were approximated with steristrips. The drapes were removed. A sterile dressing was applied. The patient was then awakened from general anesthesia and transferred to the recovery room in stable condition. At the end of the procedure all sponge, needle and instrument counts were correct.   PLAN OF CARE: Admit for overnight observation  PATIENT DISPOSITION:  PACU - hemodynamically stable.   Delay start of Pharmacological VTE agent (>24hrs) due to surgical blood loss or risk of bleeding:  yes

## 2017-11-16 NOTE — Anesthesia Postprocedure Evaluation (Signed)
Anesthesia Post Note  Patient: Pamela Hatfield  Procedure(s) Performed: Anterior Cervical Decompression/Discectomy Fusion - Cervical four-Cervical five - Cervical five-Cervical six - Cervical six-Cervical seven (N/A Spine Cervical)     Patient location during evaluation: PACU Anesthesia Type: General Level of consciousness: awake and alert Pain management: pain level controlled Vital Signs Assessment: post-procedure vital signs reviewed and stable Respiratory status: spontaneous breathing, nonlabored ventilation, respiratory function stable and patient connected to nasal cannula oxygen Cardiovascular status: blood pressure returned to baseline and stable Postop Assessment: no apparent nausea or vomiting Anesthetic complications: no    Last Vitals:  Vitals:   11/16/17 1322 11/16/17 1328  BP: (!) 154/80 (!) 147/77  Pulse: 92 93  Resp: 13 13  Temp:  36.7 C  SpO2: 92% 93%    Last Pain:  Vitals:   11/16/17 1328  TempSrc:   PainSc: [place]

## 2017-11-16 NOTE — H&P (Signed)
Subjective:   Patient is a 67 y.o. female admitted for nck pain. The patient first presented to me with complaints of neck pain, shooting pains in the arm(s) and numbness of the arm(s). Onset of symptoms was several months ago. The pain is described as aching and occurs all day. The pain is rated severe, and is located in the neck and radiates to the arms. The symptoms have been progressive. Symptoms are exacerbated by extending head backwards, and are relieved by none.  Previous work up includes MRI of cervical spine, results: spinal stenosis.  Past Medical History:  Diagnosis Date  . Accessory spleen   . Arthritis    multiple areas of her body  . Asthma   . Cancer [facility])    located on her aorta and small intestines  . Depression   . Diabetes mellitus    diet controlled  . Dyspnea    today 11/09/2017, reports that her breathing is normal for her   . Dysrhythmia    hx rapid heart beat  . Fibromyalgia   . GERD (gastroesophageal reflux disease)   . History of hiatal hernia   . Hyperlipidemia   . Hypertension   . Myalgia and myositis, unspecified   . Sleep apnea    no cpap used, pt does not like, use to use CPAP but due to insurance had to return the device   . Weight loss 02/25/2011    Past Surgical History:  Procedure Laterality Date  . ABDOMINAL HYSTERECTOMY  yrs ago   ovaries remain, done because of endometriosis  . BACK SURGERY  yrs ago   lower back  . BREAST LUMPECTOMY     both breast  . CARDIAC CATHETERIZATION    . CHOLECYSTECTOMY  yrs ago  . ESOPHAGEAL DILATION  06/14/2012   Procedure: ESOPHAGEAL DILATION;  Surgeon: Daneil Dolin, MD;  Location: [facility] SUITE;  Service: Endoscopy;;  . EUS  07/13/2012   Procedure: UPPER ENDOSCOPIC ULTRASOUND (EUS) LINEAR;  Surgeon: Milus Banister, MD;  Location: [facility] ENDOSCOPY;  Service: Endoscopy;  Laterality: N/A;  . NOSE SURGERY  yrs ago  . ROTATOR CUFF REPAIR  yrs ago   left    Allergies  Allergen Reactions  . Sulfa Antibiotics  Hives  . Clarithromycin Rash  . Codeine Itching and Other (See Comments)    Headaches  . Tape Itching    Social History   Tobacco Use  . Smoking status: Former Smoker    Packs/day: 0.50    Years: 20.00    Pack years: 10.00    Types: Cigarettes    Last attempt to quit: 09/13/1992    Years since quitting: 25.1  . Smokeless tobacco: Never Used  . Tobacco comment: quit about 25 + years ago  Substance Use Topics  . Alcohol use: No    Family History  Problem Relation Age of Onset  . Colon cancer Father   . Colon cancer Maternal Aunt   . Colon cancer Maternal Uncle    Prior to Admission medications   Medication Sig Start Date End Date Taking? Authorizing Provider  albuterol (PROVENTIL HFA;VENTOLIN HFA) 108 (90 Base) MCG/ACT inhaler Inhale 2 puffs into the lungs every 6 (six) hours as needed for wheezing.   Yes [provider]  Charcoal Activated (ACTIVATED CHARCOAL) POWD 2 capsules by Does not apply route.   Yes [provider]  cyclobenzaprine (FLEXERIL) 5 MG tablet Take 5 mg by mouth 3 (three) times daily as needed for muscle spasms.  Yes [provider]  DULoxetine (CYMBALTA) 30 MG capsule Take 30 mg by mouth daily before breakfast.    Yes [provider]  enalapril (VASOTEC) 10 MG tablet Take 10 mg by mouth daily.    Yes [provider]  fluticasone (FLONASE) 50 MCG/ACT nasal spray Place 1-2 sprays into both nostrils daily as needed for allergies or rhinitis.   Yes [provider]  gabapentin (NEURONTIN) 600 MG tablet Take 600 mg by mouth 3 (three) times daily.    Yes [provider]  HYDROcodone-acetaminophen (NORCO) 10-325 MG tablet Take 1 tablet by mouth every 4 (four) hours.  01/02/16  Yes [provider]  metoprolol (LOPRESSOR) 50 MG tablet Take 50 mg by mouth 2 (two) times daily.   Yes [provider]  montelukast (SINGULAIR) 10 MG tablet Take 10 mg by mouth daily.   Yes [provider]   Multiple Vitamin (MULTIVITAMIN WITH MINERALS) TABS tablet Take 1 tablet by mouth daily.   Yes [provider]  octreotide (SANDOSTATIN LAR) 10 MG injection Inject 10 mg into the muscle every 28 (twenty-eight) days.   Yes [provider]  pantoprazole (PROTONIX) 40 MG tablet Take 40 mg by mouth at bedtime.    Yes [provider]  zolpidem (AMBIEN) 5 MG tablet Take 5 mg by mouth at bedtime as needed for sleep.   Yes [provider]  budesonide-formoterol (SYMBICORT) 80-4.5 MCG/ACT inhaler Inhale 2 puffs into the lungs 2 (two) times daily as needed (for respiratory difficulties.).    [provider]  hydrOXYzine (ATARAX/VISTARIL) 10 MG tablet Take 10 mg by mouth 3 (three) times daily as needed for itching.    [provider]  lisinopril (PRINIVIL,ZESTRIL) 20 MG tablet Take 20 mg by mouth daily.    [provider]  oxyCODONE (OXY IR/ROXICODONE) 5 MG immediate release tablet Take 5 mg by mouth at bedtime as needed for severe pain.    [provider]  prochlorperazine (COMPAZINE) 5 MG tablet Take 5 mg by mouth every 6 (six) hours as needed for nausea or vomiting.    [provider]     Review of Systems  Positive ROS: neg  All other systems have been reviewed and were otherwise negative with the exception of those mentioned in the HPI and as above.  Objective: Vital signs in last 24 hours: Temp:  [97.9 F (36.6 C)] 97.9 F (36.6 C) (03/06 0746) Pulse Rate:  [72] 72 (03/06 0746) Resp:  [20] 20 (03/06 0746) BP: (149)/(74) 149/74 (03/06 0746) SpO2:  [100 %] 100 % (03/06 0746) Weight:  [80.2 kg (176 lb 12.8 oz)] 80.2 kg (176 lb 12.8 oz) (03/06 0746)  General Appearance: Alert, cooperative, no distress, appears stated age Head: Normocephalic, without obvious abnormality, atraumatic Eyes: PERRL, conjunctiva/corneas clear, EOM's intact      Neck: Supple, symmetrical, trachea midline, Back: Symmetric, no curvature,  ROM normal, no CVA tenderness Lungs:  respirations unlabored Heart: Regular rate and rhythm Abdomen: Soft, non-tender Extremities: Extremities normal, atraumatic, no cyanosis or edema Pulses: 2+ and symmetric all extremities Skin: Skin color, texture, turgor normal, no rashes or lesions  NEUROLOGIC:  Mental status: Alert and oriented x4, no aphasia, good attention span, fund of knowledge and memory  Motor Exam - grossly normal Sensory Exam - grossly normal Reflexes: 1+ Coordination - grossly normal Gait - grossly normal Balance - grossly normal Cranial Nerves: I: smell Not tested  II: visual acuity  OS: nl    OD: nl  II: visual fields Full to confrontation  II: pupils Equal, round, reactive to light  III,VII: ptosis None  III,IV,VI: extraocular muscles  Full ROM  V: mastication Normal  V: facial light touch sensation  Normal  V,VII: corneal reflex  Present  VII: facial muscle function - upper  Normal  VII: facial muscle function - lower Normal  VIII: hearing Not tested  IX: soft palate elevation  Normal  IX,X: gag reflex Present  XI: trapezius strength  5/5  XI: sternocleidomastoid strength 5/5  XI: neck flexion strength  5/5  XII: tongue strength  Normal    Data Review Lab Results  Component Value Date   WBC 10.6 (H) 11/09/2017   HGB 12.3 11/09/2017   HCT 38.4 11/09/2017   MCV 92.1 11/09/2017   PLT 333 11/09/2017   Lab Results  Component Value Date   NA 138 11/09/2017   K 4.0 11/09/2017   CL 103 11/09/2017   CO2 25 11/09/2017   BUN 10 11/09/2017   CREATININE 0.82 11/09/2017   GLUCOSE 94 11/09/2017   Lab Results  Component Value Date   INR 1.03 11/09/2017    Assessment:   Cervical neck pain with herniated nucleus pulposus/ spondylosis/ stenosis at C4-7. Estimated body mass index is 29.42 kg/m as calculated from the following:   Height as of this encounter: 5\' 5"  (1.651 m).   Weight as of this encounter: 80.2 kg (176 lb 12.8 oz).  Patient has failed  conservative therapy. Planned surgery : ACDF C4-5 C5-6 C6-7  Plan:   I explained the condition and procedure to the patient and answered any questions.  Patient wishes to proceed with procedure as planned. Understands risks/ benefits/ and expected or typical outcomes.  Hayle Parisi S 11/16/2017 9:24 AM

## 2017-11-17 DIAGNOSIS — M4722 Other spondylosis with radiculopathy, cervical region: Secondary | ICD-10-CM | POA: Diagnosis not present

## 2017-11-17 LAB — GLUCOSE, CAPILLARY: Glucose-Capillary: 131 mg/dL — ABNORMAL HIGH (ref 65–99)

## 2017-11-17 MED ORDER — HYDROCODONE-ACETAMINOPHEN 10-325 MG PO TABS: 1.0000 | ORAL_TABLET | ORAL | 0 refills | Status: DC | PRN

## 2017-11-17 MED ORDER — CYCLOBENZAPRINE HCL 5 MG PO TABS: 5.0000 mg | ORAL_TABLET | Freq: Three times a day (TID) | ORAL | 0 refills | Status: DC | PRN

## 2017-11-17 MED FILL — Thrombin For Soln 5000 Unit: CUTANEOUS | Qty: 5000 | Status: AC

## 2017-11-17 NOTE — Discharge Summary (Signed)
Physician Discharge Summary  Patient ID: Pamela Hatfield MRN: [id number] DOB/AGE: 18-Jan-1951 67 y.o.  Admit date: 11/16/2017 Discharge date: 11/17/2017  Admission Diagnoses: Cervical spondylosis with cervical spinal stenosis, neck pain with radiculopathy   Discharge Diagnoses: same   Discharged Condition: good  Hospital Course: The patient was admitted on 11/16/2017 and taken to the operating room where the patient underwent acdf C4-5,5-6,6-7. The patient tolerated the procedure well and was taken to the recovery room and then to the floor in stable condition. The hospital course was routine. There were no complications. The wound remained clean dry and intact. Pt had appropriate neck and shoulder soreness. No complaints of new pain or new N/T/W. The patient remained afebrile with stable vital signs, and tolerated a regular diet. The patient continued to increase activities, and pain was well controlled with oral pain medications.   Consults: None  Significant Diagnostic Studies:  Results for orders placed or performed during the hospital encounter of 11/16/17  Glucose, capillary  Result Value Ref Range   Glucose-Capillary 104 (H) 65 - 99 mg/dL   Comment 1 Notify RN    Comment 2 Document in Chart   Glucose, capillary  Result Value Ref Range   Glucose-Capillary 142 (H) 65 - 99 mg/dL  Glucose, capillary  Result Value Ref Range   Glucose-Capillary 184 (H) 65 - 99 mg/dL   Comment 1 Notify RN    Comment 2 Document in Chart   Glucose, capillary  Result Value Ref Range   Glucose-Capillary 198 (H) 65 - 99 mg/dL   Comment 1 Notify RN    Comment 2 Document in Chart   Glucose, capillary  Result Value Ref Range   Glucose-Capillary 131 (H) 65 - 99 mg/dL   Comment 1 Notify RN    Comment 2 Document in Chart     Dg Chest 2 View  Result Date: 11/09/2017 CLINICAL DATA:  Preop cervical surgery.  History of asthma EXAM: CHEST  2 VIEW COMPARISON:  07/20/2011 FINDINGS: Heart and mediastinal  contours are within normal limits. No focal opacities or effusions. No acute bony abnormality. IMPRESSION: No active cardiopulmonary disease. Electronically Signed   By: Rolm Baptise M.D.   On: 11/09/2017 19:37   Dg Cervical Spine 2-3 Views  Result Date: 11/16/2017 CLINICAL DATA:  C3-C7 ACDF. EXAM: DG C-ARM 61-120 MIN; CERVICAL SPINE - 2-3 VIEW COMPARISON:  Preoperative lateral radiograph of the cervical spine dated October 18, 2017 FINDINGS: The patient has undergone ACDF from C4 through C7. The metallic hardware appears intact and appropriately position. Limited visualization of the native bones at these levels reveals no acute abnormality. The trachea is intubated. IMPRESSION: Two intraoperative lateral fluoro spot images reveal the patient to of undergone ACDF from C4 through C7 without immediate complication. Fluoro time reported was 32 seconds. Electronically Signed   By: David  Martinique M.D.   On: 11/16/2017 12:54   Dg C-arm 1-60 Min  Result Date: 11/16/2017 CLINICAL DATA:  C3-C7 ACDF. EXAM: DG C-ARM 61-120 MIN; CERVICAL SPINE - 2-3 VIEW COMPARISON:  Preoperative lateral radiograph of the cervical spine dated October 18, 2017 FINDINGS: The patient has undergone ACDF from C4 through C7. The metallic hardware appears intact and appropriately position. Limited visualization of the native bones at these levels reveals no acute abnormality. The trachea is intubated. IMPRESSION: Two intraoperative lateral fluoro spot images reveal the patient to of undergone ACDF from C4 through C7 without immediate complication. Fluoro time reported was 32 seconds. Electronically Signed   By: Shanon Brow  Martinique M.D.   On: 11/16/2017 12:54    Antibiotics:  Anti-infectives (From admission, onward)   Start     Dose/Rate Route Frequency Ordered Stop   11/16/17 1800  ceFAZolin (ANCEF) IVPB 2g/100 mL premix     2 g 200 mL/hr over 30 Minutes Intravenous Every 8 hours 11/16/17 1353 11/17/17 0219   11/16/17 1006  bacitracin 50,000  Units in sodium chloride irrigation 0.9 % 500 mL irrigation  Status:  Discontinued       As needed 11/16/17 1007 11/16/17 1230   11/16/17 0736  ceFAZolin (ANCEF) 2-4 GM/100ML-% IVPB    Comments:  Tamsen Snider   : cabinet override      11/16/17 0736 11/16/17 0951   11/16/17 0730  ceFAZolin (ANCEF) IVPB 2g/100 mL premix     2 g 200 mL/hr over 30 Minutes Intravenous On call to O.R. 11/16/17 0730 11/16/17 0951      Discharge Exam: Blood pressure 124/70, pulse 82, temperature 98.7 F (37.1 C), temperature source Oral, resp. rate 18, height 5\' 5"  (1.651 m), weight 80.2 kg (176 lb 12.8 oz), SpO2 94 %. Neurologic: Grossly normal Ambulating and voiding well  Discharge Medications:   Allergies as of 11/17/2017      Reactions   Sulfa Antibiotics Hives   Clarithromycin Rash   Codeine Itching, Other (See Comments)   Headaches   Tape Itching      Medication List    TAKE these medications   activated charcoal Powd 2 capsules by Does not apply route.   albuterol 108 (90 Base) MCG/ACT inhaler Commonly known as:  PROVENTIL HFA;VENTOLIN HFA Inhale 2 puffs into the lungs every 6 (six) hours as needed for wheezing.   budesonide-formoterol 80-4.5 MCG/ACT inhaler Commonly known as:  SYMBICORT Inhale 2 puffs into the lungs 2 (two) times daily as needed (for respiratory difficulties.).   cyclobenzaprine 5 MG tablet Commonly known as:  FLEXERIL Take 5 mg by mouth 3 (three) times daily as needed for muscle spasms. What changed:  Another medication with the same name was added. Make sure you understand how and when to take each.   cyclobenzaprine 5 MG tablet Commonly known as:  FLEXERIL Take 1 tablet (5 mg total) by mouth 3 (three) times daily as needed for muscle spasms. What changed:  You were already taking a medication with the same name, and this prescription was added. Make sure you understand how and when to take each.   DULoxetine 30 MG capsule Commonly known as:  CYMBALTA Take 30  mg by mouth daily before breakfast.   fluticasone 50 MCG/ACT nasal spray Commonly known as:  FLONASE Place 1-2 sprays into both nostrils daily as needed for allergies or rhinitis.   gabapentin 600 MG tablet Commonly known as:  NEURONTIN Take 600 mg by mouth 3 (three) times daily.   HYDROcodone-acetaminophen 10-325 MG tablet Commonly known as:  NORCO Take 1 tablet by mouth every 4 (four) hours. What changed:  Another medication with the same name was added. Make sure you understand how and when to take each.   HYDROcodone-acetaminophen 10-325 MG tablet Commonly known as:  NORCO Take 1-2 tablets by mouth every 4 (four) hours as needed for moderate pain. What changed:  You were already taking a medication with the same name, and this prescription was added. Make sure you understand how and when to take each.   hydrOXYzine 10 MG tablet Commonly known as:  ATARAX/VISTARIL Take 10 mg by mouth 3 (three) times daily as needed  for itching.   lisinopril 20 MG tablet Commonly known as:  PRINIVIL,ZESTRIL Take 20 mg by mouth daily.   metoprolol tartrate 50 MG tablet Commonly known as:  LOPRESSOR Take 50 mg by mouth 2 (two) times daily.   montelukast 10 MG tablet Commonly known as:  SINGULAIR Take 10 mg by mouth daily.   multivitamin with minerals Tabs tablet Take 1 tablet by mouth daily.   octreotide 10 MG injection Commonly known as:  SANDOSTATIN LAR Inject 10 mg into the muscle every 28 (twenty-eight) days.   oxyCODONE 5 MG immediate release tablet Commonly known as:  Oxy IR/ROXICODONE Take 5 mg by mouth at bedtime as needed for severe pain.   pantoprazole 40 MG tablet Commonly known as:  PROTONIX Take 40 mg by mouth at bedtime.   prochlorperazine 5 MG tablet Commonly known as:  COMPAZINE Take 5 mg by mouth every 6 (six) hours as needed for nausea or vomiting.   VASOTEC 10 MG tablet Generic drug:  enalapril Take 10 mg by mouth daily.   zolpidem 5 MG tablet Commonly  known as:  AMBIEN Take 5 mg by mouth at bedtime as needed for sleep.       Disposition: home   Final Dx: same  Discharge Instructions     Remove dressing in 72 hours   Complete by:  As directed    Call MD for:  difficulty breathing, headache or visual disturbances   Complete by:  As directed    Call MD for:  extreme fatigue   Complete by:  As directed    Call MD for:  hives   Complete by:  As directed    Call MD for:  persistant dizziness or light-headedness   Complete by:  As directed    Call MD for:  persistant nausea and vomiting   Complete by:  As directed    Call MD for:  redness, tenderness, or signs of infection (pain, swelling, redness, odor or green/yellow discharge around incision site)   Complete by:  As directed    Call MD for:  severe uncontrolled pain   Complete by:  As directed    Call MD for:  temperature >100.4   Complete by:  As directed    Diet - low sodium heart healthy   Complete by:  As directed    Driving Restrictions   Complete by:  As directed    No driving 2 weeks   Increase activity slowly   Complete by:  As directed          Signed: Ocie Cornfield Payden Docter 11/17/2017, 7:31 AM

## 2017-11-17 NOTE — Progress Notes (Signed)
Pt doing well. Pt and daughter given D/C instructions with Rx's, verbal understanding was provided. Pt's incision is clean and dry with no sign of infection. Pt's IV was removed prior to D/C. Pt D/C'd home via wheelchair @ 1030 per MD order. Pt is stable @ D/C and has no other needs at this time. Holli Humbles, RN

## 2017-11-17 NOTE — Discharge Instructions (Signed)

## 2017-11-18 ENCOUNTER — Encounter (HOSPITAL_COMMUNITY): Payer: Self-pay | Admitting: Neurological Surgery

## 2017-12-06 DIAGNOSIS — D3A00 Benign carcinoid tumor of unspecified site: Secondary | ICD-10-CM | POA: Diagnosis not present

## 2017-12-06 DIAGNOSIS — Z87891 Personal history of nicotine dependence: Secondary | ICD-10-CM | POA: Diagnosis not present

## 2017-12-06 DIAGNOSIS — Z79899 Other long term (current) drug therapy: Secondary | ICD-10-CM | POA: Diagnosis not present

## 2017-12-06 DIAGNOSIS — Z5111 Encounter for antineoplastic chemotherapy: Secondary | ICD-10-CM | POA: Diagnosis not present

## 2017-12-26 DIAGNOSIS — C259 Malignant neoplasm of pancreas, unspecified: Secondary | ICD-10-CM | POA: Diagnosis not present

## 2017-12-26 DIAGNOSIS — F339 Major depressive disorder, recurrent, unspecified: Secondary | ICD-10-CM | POA: Diagnosis not present

## 2017-12-26 DIAGNOSIS — R7301 Impaired fasting glucose: Secondary | ICD-10-CM | POA: Diagnosis not present

## 2017-12-26 DIAGNOSIS — G4733 Obstructive sleep apnea (adult) (pediatric): Secondary | ICD-10-CM | POA: Diagnosis not present

## 2017-12-26 DIAGNOSIS — R599 Enlarged lymph nodes, unspecified: Secondary | ICD-10-CM | POA: Diagnosis not present

## 2017-12-26 DIAGNOSIS — R1011 Right upper quadrant pain: Secondary | ICD-10-CM | POA: Diagnosis not present

## 2017-12-26 DIAGNOSIS — F419 Anxiety disorder, unspecified: Secondary | ICD-10-CM | POA: Diagnosis not present

## 2017-12-26 DIAGNOSIS — Z6828 Body mass index (BMI) 28.0-28.9, adult: Secondary | ICD-10-CM | POA: Diagnosis not present

## 2017-12-26 DIAGNOSIS — D509 Iron deficiency anemia, unspecified: Secondary | ICD-10-CM | POA: Diagnosis not present

## 2017-12-26 DIAGNOSIS — G629 Polyneuropathy, unspecified: Secondary | ICD-10-CM | POA: Diagnosis not present

## 2017-12-26 DIAGNOSIS — L309 Dermatitis, unspecified: Secondary | ICD-10-CM | POA: Diagnosis not present

## 2017-12-26 DIAGNOSIS — K219 Gastro-esophageal reflux disease without esophagitis: Secondary | ICD-10-CM | POA: Diagnosis not present

## 2017-12-28 DIAGNOSIS — F331 Major depressive disorder, recurrent, moderate: Secondary | ICD-10-CM | POA: Diagnosis not present

## 2017-12-28 DIAGNOSIS — K219 Gastro-esophageal reflux disease without esophagitis: Secondary | ICD-10-CM | POA: Diagnosis not present

## 2017-12-28 DIAGNOSIS — G4730 Sleep apnea, unspecified: Secondary | ICD-10-CM | POA: Diagnosis not present

## 2017-12-28 DIAGNOSIS — R7301 Impaired fasting glucose: Secondary | ICD-10-CM | POA: Diagnosis not present

## 2017-12-28 DIAGNOSIS — E782 Mixed hyperlipidemia: Secondary | ICD-10-CM | POA: Diagnosis not present

## 2017-12-28 DIAGNOSIS — G9009 Other idiopathic peripheral autonomic neuropathy: Secondary | ICD-10-CM | POA: Diagnosis not present

## 2017-12-28 DIAGNOSIS — I10 Essential (primary) hypertension: Secondary | ICD-10-CM | POA: Diagnosis not present

## 2017-12-28 DIAGNOSIS — Z6829 Body mass index (BMI) 29.0-29.9, adult: Secondary | ICD-10-CM | POA: Diagnosis not present

## 2017-12-28 DIAGNOSIS — J302 Other seasonal allergic rhinitis: Secondary | ICD-10-CM | POA: Diagnosis not present

## 2018-01-03 DIAGNOSIS — K5900 Constipation, unspecified: Secondary | ICD-10-CM | POA: Diagnosis not present

## 2018-01-03 DIAGNOSIS — D3A00 Benign carcinoid tumor of unspecified site: Secondary | ICD-10-CM | POA: Diagnosis not present

## 2018-01-03 DIAGNOSIS — K639 Disease of intestine, unspecified: Secondary | ICD-10-CM | POA: Diagnosis not present

## 2018-01-03 DIAGNOSIS — K869 Disease of pancreas, unspecified: Secondary | ICD-10-CM | POA: Diagnosis not present

## 2018-01-03 DIAGNOSIS — R590 Localized enlarged lymph nodes: Secondary | ICD-10-CM | POA: Diagnosis not present

## 2018-01-03 DIAGNOSIS — R600 Localized edema: Secondary | ICD-10-CM | POA: Diagnosis not present

## 2018-01-03 DIAGNOSIS — L905 Scar conditions and fibrosis of skin: Secondary | ICD-10-CM | POA: Diagnosis not present

## 2018-01-03 DIAGNOSIS — R1010 Upper abdominal pain, unspecified: Secondary | ICD-10-CM | POA: Diagnosis not present

## 2018-01-03 DIAGNOSIS — R52 Pain, unspecified: Secondary | ICD-10-CM | POA: Diagnosis not present

## 2018-01-11 DIAGNOSIS — Z79899 Other long term (current) drug therapy: Secondary | ICD-10-CM | POA: Diagnosis not present

## 2018-01-11 DIAGNOSIS — M797 Fibromyalgia: Secondary | ICD-10-CM | POA: Diagnosis not present

## 2018-01-11 DIAGNOSIS — M5030 Other cervical disc degeneration, unspecified cervical region: Secondary | ICD-10-CM | POA: Diagnosis not present

## 2018-01-11 DIAGNOSIS — E340 Carcinoid syndrome: Secondary | ICD-10-CM | POA: Diagnosis not present

## 2018-01-11 DIAGNOSIS — M5136 Other intervertebral disc degeneration, lumbar region: Secondary | ICD-10-CM | POA: Diagnosis not present

## 2018-01-16 DIAGNOSIS — G4733 Obstructive sleep apnea (adult) (pediatric): Secondary | ICD-10-CM | POA: Diagnosis not present

## 2018-01-17 DIAGNOSIS — M5030 Other cervical disc degeneration, unspecified cervical region: Secondary | ICD-10-CM | POA: Diagnosis not present

## 2018-01-17 DIAGNOSIS — M542 Cervicalgia: Secondary | ICD-10-CM | POA: Diagnosis not present

## 2018-01-17 DIAGNOSIS — M5136 Other intervertebral disc degeneration, lumbar region: Secondary | ICD-10-CM | POA: Diagnosis not present

## 2018-01-17 DIAGNOSIS — Z79899 Other long term (current) drug therapy: Secondary | ICD-10-CM | POA: Diagnosis not present

## 2018-01-24 DIAGNOSIS — C7A012 Malignant carcinoid tumor of the ileum: Secondary | ICD-10-CM | POA: Diagnosis not present

## 2018-01-24 DIAGNOSIS — K6389 Other specified diseases of intestine: Secondary | ICD-10-CM | POA: Diagnosis not present

## 2018-01-24 DIAGNOSIS — R1010 Upper abdominal pain, unspecified: Secondary | ICD-10-CM | POA: Diagnosis not present

## 2018-01-25 DIAGNOSIS — M5030 Other cervical disc degeneration, unspecified cervical region: Secondary | ICD-10-CM | POA: Diagnosis not present

## 2018-01-25 DIAGNOSIS — M5136 Other intervertebral disc degeneration, lumbar region: Secondary | ICD-10-CM | POA: Diagnosis not present

## 2018-01-25 DIAGNOSIS — Z79899 Other long term (current) drug therapy: Secondary | ICD-10-CM | POA: Diagnosis not present

## 2018-01-31 DIAGNOSIS — K639 Disease of intestine, unspecified: Secondary | ICD-10-CM | POA: Diagnosis not present

## 2018-01-31 DIAGNOSIS — K5900 Constipation, unspecified: Secondary | ICD-10-CM | POA: Diagnosis not present

## 2018-01-31 DIAGNOSIS — R634 Abnormal weight loss: Secondary | ICD-10-CM | POA: Diagnosis not present

## 2018-01-31 DIAGNOSIS — Z5111 Encounter for antineoplastic chemotherapy: Secondary | ICD-10-CM | POA: Diagnosis not present

## 2018-01-31 DIAGNOSIS — E119 Type 2 diabetes mellitus without complications: Secondary | ICD-10-CM | POA: Diagnosis not present

## 2018-01-31 DIAGNOSIS — Z79899 Other long term (current) drug therapy: Secondary | ICD-10-CM | POA: Diagnosis not present

## 2018-01-31 DIAGNOSIS — D3A00 Benign carcinoid tumor of unspecified site: Secondary | ICD-10-CM | POA: Diagnosis not present

## 2018-01-31 DIAGNOSIS — I10 Essential (primary) hypertension: Secondary | ICD-10-CM | POA: Diagnosis not present

## 2018-02-14 DIAGNOSIS — M5030 Other cervical disc degeneration, unspecified cervical region: Secondary | ICD-10-CM | POA: Diagnosis not present

## 2018-02-14 DIAGNOSIS — Z79899 Other long term (current) drug therapy: Secondary | ICD-10-CM | POA: Diagnosis not present

## 2018-02-14 DIAGNOSIS — M5136 Other intervertebral disc degeneration, lumbar region: Secondary | ICD-10-CM | POA: Diagnosis not present

## 2018-02-16 DIAGNOSIS — G4733 Obstructive sleep apnea (adult) (pediatric): Secondary | ICD-10-CM | POA: Diagnosis not present

## 2018-02-22 DIAGNOSIS — R87610 Atypical squamous cells of undetermined significance on cytologic smear of cervix (ASC-US): Secondary | ICD-10-CM | POA: Diagnosis not present

## 2018-02-22 DIAGNOSIS — R87810 Cervical high risk human papillomavirus (HPV) DNA test positive: Secondary | ICD-10-CM | POA: Diagnosis not present

## 2018-02-28 DIAGNOSIS — Z5111 Encounter for antineoplastic chemotherapy: Secondary | ICD-10-CM | POA: Diagnosis not present

## 2018-02-28 DIAGNOSIS — K6389 Other specified diseases of intestine: Secondary | ICD-10-CM | POA: Diagnosis not present

## 2018-02-28 DIAGNOSIS — K639 Disease of intestine, unspecified: Secondary | ICD-10-CM | POA: Diagnosis not present

## 2018-02-28 DIAGNOSIS — R634 Abnormal weight loss: Secondary | ICD-10-CM | POA: Diagnosis not present

## 2018-02-28 DIAGNOSIS — Z79899 Other long term (current) drug therapy: Secondary | ICD-10-CM | POA: Diagnosis not present

## 2018-02-28 DIAGNOSIS — D3A00 Benign carcinoid tumor of unspecified site: Secondary | ICD-10-CM | POA: Diagnosis not present

## 2018-02-28 DIAGNOSIS — D7389 Other diseases of spleen: Secondary | ICD-10-CM | POA: Diagnosis not present

## 2018-02-28 DIAGNOSIS — Q8909 Congenital malformations of spleen: Secondary | ICD-10-CM | POA: Diagnosis not present

## 2018-03-17 DIAGNOSIS — G894 Chronic pain syndrome: Secondary | ICD-10-CM | POA: Diagnosis not present

## 2018-03-17 DIAGNOSIS — E340 Carcinoid syndrome: Secondary | ICD-10-CM | POA: Diagnosis not present

## 2018-03-17 DIAGNOSIS — M5136 Other intervertebral disc degeneration, lumbar region: Secondary | ICD-10-CM | POA: Diagnosis not present

## 2018-03-17 DIAGNOSIS — Z79899 Other long term (current) drug therapy: Secondary | ICD-10-CM | POA: Diagnosis not present

## 2018-03-17 DIAGNOSIS — M5030 Other cervical disc degeneration, unspecified cervical region: Secondary | ICD-10-CM | POA: Diagnosis not present

## 2018-03-18 DIAGNOSIS — G4733 Obstructive sleep apnea (adult) (pediatric): Secondary | ICD-10-CM | POA: Diagnosis not present

## 2018-03-28 DIAGNOSIS — D3A00 Benign carcinoid tumor of unspecified site: Secondary | ICD-10-CM | POA: Diagnosis not present

## 2018-03-28 DIAGNOSIS — Z5111 Encounter for antineoplastic chemotherapy: Secondary | ICD-10-CM | POA: Diagnosis not present

## 2018-04-11 DIAGNOSIS — I25110 Atherosclerotic heart disease of native coronary artery with unstable angina pectoris: Secondary | ICD-10-CM | POA: Diagnosis not present

## 2018-04-11 DIAGNOSIS — J45909 Unspecified asthma, uncomplicated: Secondary | ICD-10-CM | POA: Diagnosis not present

## 2018-04-11 DIAGNOSIS — I10 Essential (primary) hypertension: Secondary | ICD-10-CM | POA: Diagnosis not present

## 2018-04-11 DIAGNOSIS — G894 Chronic pain syndrome: Secondary | ICD-10-CM | POA: Diagnosis not present

## 2018-04-12 DIAGNOSIS — G4733 Obstructive sleep apnea (adult) (pediatric): Secondary | ICD-10-CM | POA: Diagnosis not present

## 2018-04-18 DIAGNOSIS — G4733 Obstructive sleep apnea (adult) (pediatric): Secondary | ICD-10-CM | POA: Diagnosis not present

## 2018-04-25 DIAGNOSIS — R590 Localized enlarged lymph nodes: Secondary | ICD-10-CM | POA: Diagnosis not present

## 2018-04-25 DIAGNOSIS — D3A00 Benign carcinoid tumor of unspecified site: Secondary | ICD-10-CM | POA: Diagnosis not present

## 2018-04-25 DIAGNOSIS — K6389 Other specified diseases of intestine: Secondary | ICD-10-CM | POA: Diagnosis not present

## 2018-04-25 DIAGNOSIS — K869 Disease of pancreas, unspecified: Secondary | ICD-10-CM | POA: Diagnosis not present

## 2018-04-25 DIAGNOSIS — K5900 Constipation, unspecified: Secondary | ICD-10-CM | POA: Diagnosis not present

## 2018-04-25 DIAGNOSIS — R634 Abnormal weight loss: Secondary | ICD-10-CM | POA: Diagnosis not present

## 2018-04-25 DIAGNOSIS — G8929 Other chronic pain: Secondary | ICD-10-CM | POA: Diagnosis not present

## 2018-04-25 DIAGNOSIS — K639 Disease of intestine, unspecified: Secondary | ICD-10-CM | POA: Diagnosis not present

## 2018-04-25 DIAGNOSIS — E118 Type 2 diabetes mellitus with unspecified complications: Secondary | ICD-10-CM | POA: Diagnosis not present

## 2018-04-25 DIAGNOSIS — I10 Essential (primary) hypertension: Secondary | ICD-10-CM | POA: Diagnosis not present

## 2018-04-25 DIAGNOSIS — K3189 Other diseases of stomach and duodenum: Secondary | ICD-10-CM | POA: Diagnosis not present

## 2018-04-28 DIAGNOSIS — Z79899 Other long term (current) drug therapy: Secondary | ICD-10-CM | POA: Diagnosis not present

## 2018-04-28 DIAGNOSIS — G894 Chronic pain syndrome: Secondary | ICD-10-CM | POA: Diagnosis not present

## 2018-04-28 DIAGNOSIS — E340 Carcinoid syndrome: Secondary | ICD-10-CM | POA: Diagnosis not present

## 2018-04-28 DIAGNOSIS — M5136 Other intervertebral disc degeneration, lumbar region: Secondary | ICD-10-CM | POA: Diagnosis not present

## 2018-05-16 DIAGNOSIS — M792 Neuralgia and neuritis, unspecified: Secondary | ICD-10-CM | POA: Diagnosis not present

## 2018-05-16 DIAGNOSIS — Z6830 Body mass index (BMI) 30.0-30.9, adult: Secondary | ICD-10-CM | POA: Diagnosis not present

## 2018-05-16 DIAGNOSIS — M542 Cervicalgia: Secondary | ICD-10-CM | POA: Diagnosis not present

## 2018-05-19 DIAGNOSIS — G4733 Obstructive sleep apnea (adult) (pediatric): Secondary | ICD-10-CM | POA: Diagnosis not present

## 2018-05-23 DIAGNOSIS — D3A00 Benign carcinoid tumor of unspecified site: Secondary | ICD-10-CM | POA: Diagnosis not present

## 2018-05-29 DIAGNOSIS — Z79899 Other long term (current) drug therapy: Secondary | ICD-10-CM | POA: Diagnosis not present

## 2018-05-29 DIAGNOSIS — G894 Chronic pain syndrome: Secondary | ICD-10-CM | POA: Diagnosis not present

## 2018-05-29 DIAGNOSIS — M5136 Other intervertebral disc degeneration, lumbar region: Secondary | ICD-10-CM | POA: Diagnosis not present

## 2018-05-29 DIAGNOSIS — M5030 Other cervical disc degeneration, unspecified cervical region: Secondary | ICD-10-CM | POA: Diagnosis not present

## 2018-06-15 DIAGNOSIS — M542 Cervicalgia: Secondary | ICD-10-CM | POA: Diagnosis not present

## 2018-06-15 DIAGNOSIS — R202 Paresthesia of skin: Secondary | ICD-10-CM | POA: Insufficient documentation

## 2018-06-15 DIAGNOSIS — M545 Low back pain: Secondary | ICD-10-CM | POA: Diagnosis not present

## 2018-06-15 DIAGNOSIS — R200 Anesthesia of skin: Secondary | ICD-10-CM | POA: Diagnosis not present

## 2018-06-18 DIAGNOSIS — G4733 Obstructive sleep apnea (adult) (pediatric): Secondary | ICD-10-CM | POA: Diagnosis not present

## 2018-06-20 DIAGNOSIS — D3A029 Benign carcinoid tumor of the large intestine, unspecified portion: Secondary | ICD-10-CM | POA: Diagnosis not present

## 2018-06-20 DIAGNOSIS — Z5111 Encounter for antineoplastic chemotherapy: Secondary | ICD-10-CM | POA: Diagnosis not present

## 2018-06-26 DIAGNOSIS — M792 Neuralgia and neuritis, unspecified: Secondary | ICD-10-CM | POA: Diagnosis not present

## 2018-06-26 DIAGNOSIS — M542 Cervicalgia: Secondary | ICD-10-CM | POA: Diagnosis not present

## 2018-06-29 DIAGNOSIS — G629 Polyneuropathy, unspecified: Secondary | ICD-10-CM | POA: Diagnosis not present

## 2018-06-29 DIAGNOSIS — C259 Malignant neoplasm of pancreas, unspecified: Secondary | ICD-10-CM | POA: Diagnosis not present

## 2018-06-29 DIAGNOSIS — R1011 Right upper quadrant pain: Secondary | ICD-10-CM | POA: Diagnosis not present

## 2018-06-29 DIAGNOSIS — F339 Major depressive disorder, recurrent, unspecified: Secondary | ICD-10-CM | POA: Diagnosis not present

## 2018-06-29 DIAGNOSIS — G4733 Obstructive sleep apnea (adult) (pediatric): Secondary | ICD-10-CM | POA: Diagnosis not present

## 2018-06-29 DIAGNOSIS — Z6828 Body mass index (BMI) 28.0-28.9, adult: Secondary | ICD-10-CM | POA: Diagnosis not present

## 2018-06-29 DIAGNOSIS — D509 Iron deficiency anemia, unspecified: Secondary | ICD-10-CM | POA: Diagnosis not present

## 2018-06-29 DIAGNOSIS — K219 Gastro-esophageal reflux disease without esophagitis: Secondary | ICD-10-CM | POA: Diagnosis not present

## 2018-06-29 DIAGNOSIS — L309 Dermatitis, unspecified: Secondary | ICD-10-CM | POA: Diagnosis not present

## 2018-06-29 DIAGNOSIS — F419 Anxiety disorder, unspecified: Secondary | ICD-10-CM | POA: Diagnosis not present

## 2018-06-29 DIAGNOSIS — R599 Enlarged lymph nodes, unspecified: Secondary | ICD-10-CM | POA: Diagnosis not present

## 2018-06-29 DIAGNOSIS — R7301 Impaired fasting glucose: Secondary | ICD-10-CM | POA: Diagnosis not present

## 2018-07-10 DIAGNOSIS — G9009 Other idiopathic peripheral autonomic neuropathy: Secondary | ICD-10-CM | POA: Diagnosis not present

## 2018-07-10 DIAGNOSIS — F331 Major depressive disorder, recurrent, moderate: Secondary | ICD-10-CM | POA: Diagnosis not present

## 2018-07-10 DIAGNOSIS — G4733 Obstructive sleep apnea (adult) (pediatric): Secondary | ICD-10-CM | POA: Diagnosis not present

## 2018-07-10 DIAGNOSIS — E782 Mixed hyperlipidemia: Secondary | ICD-10-CM | POA: Diagnosis not present

## 2018-07-10 DIAGNOSIS — Z23 Encounter for immunization: Secondary | ICD-10-CM | POA: Diagnosis not present

## 2018-07-10 DIAGNOSIS — Z6830 Body mass index (BMI) 30.0-30.9, adult: Secondary | ICD-10-CM | POA: Diagnosis not present

## 2018-07-10 DIAGNOSIS — K219 Gastro-esophageal reflux disease without esophagitis: Secondary | ICD-10-CM | POA: Diagnosis not present

## 2018-07-10 DIAGNOSIS — J302 Other seasonal allergic rhinitis: Secondary | ICD-10-CM | POA: Diagnosis not present

## 2018-07-10 DIAGNOSIS — Z0000 Encounter for general adult medical examination without abnormal findings: Secondary | ICD-10-CM | POA: Diagnosis not present

## 2018-07-10 DIAGNOSIS — I10 Essential (primary) hypertension: Secondary | ICD-10-CM | POA: Diagnosis not present

## 2018-07-10 DIAGNOSIS — R7301 Impaired fasting glucose: Secondary | ICD-10-CM | POA: Diagnosis not present

## 2018-07-12 ENCOUNTER — Other Ambulatory Visit (HOSPITAL_COMMUNITY): Payer: Self-pay | Admitting: Internal Medicine

## 2018-07-12 DIAGNOSIS — Z1231 Encounter for screening mammogram for malignant neoplasm of breast: Secondary | ICD-10-CM

## 2018-07-12 DIAGNOSIS — Z780 Asymptomatic menopausal state: Secondary | ICD-10-CM

## 2018-07-18 DIAGNOSIS — Z5111 Encounter for antineoplastic chemotherapy: Secondary | ICD-10-CM | POA: Diagnosis not present

## 2018-07-18 DIAGNOSIS — D3A029 Benign carcinoid tumor of the large intestine, unspecified portion: Secondary | ICD-10-CM | POA: Diagnosis not present

## 2018-07-18 DIAGNOSIS — Z79899 Other long term (current) drug therapy: Secondary | ICD-10-CM | POA: Diagnosis not present

## 2018-07-21 DIAGNOSIS — M545 Low back pain: Secondary | ICD-10-CM | POA: Diagnosis not present

## 2018-07-21 DIAGNOSIS — E340 Carcinoid syndrome: Secondary | ICD-10-CM | POA: Diagnosis not present

## 2018-07-21 DIAGNOSIS — Z79899 Other long term (current) drug therapy: Secondary | ICD-10-CM | POA: Diagnosis not present

## 2018-07-21 DIAGNOSIS — G894 Chronic pain syndrome: Secondary | ICD-10-CM | POA: Diagnosis not present

## 2018-07-27 DIAGNOSIS — E340 Carcinoid syndrome: Secondary | ICD-10-CM | POA: Diagnosis not present

## 2018-07-27 DIAGNOSIS — G894 Chronic pain syndrome: Secondary | ICD-10-CM | POA: Diagnosis not present

## 2018-07-27 DIAGNOSIS — Z79899 Other long term (current) drug therapy: Secondary | ICD-10-CM | POA: Diagnosis not present

## 2018-08-14 DIAGNOSIS — R7301 Impaired fasting glucose: Secondary | ICD-10-CM | POA: Diagnosis not present

## 2018-08-14 DIAGNOSIS — I10 Essential (primary) hypertension: Secondary | ICD-10-CM | POA: Diagnosis not present

## 2018-08-14 DIAGNOSIS — E782 Mixed hyperlipidemia: Secondary | ICD-10-CM | POA: Diagnosis not present

## 2018-08-14 DIAGNOSIS — G9009 Other idiopathic peripheral autonomic neuropathy: Secondary | ICD-10-CM | POA: Diagnosis not present

## 2018-08-14 DIAGNOSIS — K219 Gastro-esophageal reflux disease without esophagitis: Secondary | ICD-10-CM | POA: Diagnosis not present

## 2018-08-16 DIAGNOSIS — R1084 Generalized abdominal pain: Secondary | ICD-10-CM | POA: Diagnosis not present

## 2018-08-16 DIAGNOSIS — K5904 Chronic idiopathic constipation: Secondary | ICD-10-CM | POA: Diagnosis not present

## 2018-08-16 DIAGNOSIS — C7A094 Malignant carcinoid tumor of the foregut NOS: Secondary | ICD-10-CM | POA: Diagnosis not present

## 2018-08-18 ENCOUNTER — Ambulatory Visit: Payer: Self-pay | Admitting: Diagnostic Neuroimaging

## 2018-08-18 ENCOUNTER — Telehealth: Payer: Self-pay | Admitting: *Deleted

## 2018-08-18 NOTE — Telephone Encounter (Signed)
Patient was no show for new patient appointment today. 

## 2018-08-21 ENCOUNTER — Ambulatory Visit (HOSPITAL_COMMUNITY)
Admission: RE | Admit: 2018-08-21 | Discharge: 2018-08-21 | Disposition: A | Discharge status: Home / Self Care | Payer: PPO | Source: Ambulatory Visit | Attending: Internal Medicine | Admitting: Internal Medicine

## 2018-08-21 ENCOUNTER — Encounter: Payer: Self-pay | Admitting: Diagnostic Neuroimaging

## 2018-08-21 DIAGNOSIS — M85851 Other specified disorders of bone density and structure, right thigh: Secondary | ICD-10-CM | POA: Diagnosis not present

## 2018-08-21 DIAGNOSIS — Z780 Asymptomatic menopausal state: Secondary | ICD-10-CM | POA: Diagnosis not present

## 2018-08-21 DIAGNOSIS — Z1231 Encounter for screening mammogram for malignant neoplasm of breast: Secondary | ICD-10-CM

## 2018-08-22 ENCOUNTER — Other Ambulatory Visit (HOSPITAL_COMMUNITY): Payer: Self-pay | Admitting: Internal Medicine

## 2018-08-22 DIAGNOSIS — R928 Other abnormal and inconclusive findings on diagnostic imaging of breast: Secondary | ICD-10-CM

## 2018-08-22 DIAGNOSIS — K869 Disease of pancreas, unspecified: Secondary | ICD-10-CM | POA: Diagnosis not present

## 2018-08-22 DIAGNOSIS — K5900 Constipation, unspecified: Secondary | ICD-10-CM | POA: Diagnosis not present

## 2018-08-22 DIAGNOSIS — K319 Disease of stomach and duodenum, unspecified: Secondary | ICD-10-CM | POA: Diagnosis not present

## 2018-08-22 DIAGNOSIS — R52 Pain, unspecified: Secondary | ICD-10-CM | POA: Diagnosis not present

## 2018-08-22 DIAGNOSIS — Q8909 Congenital malformations of spleen: Secondary | ICD-10-CM | POA: Diagnosis not present

## 2018-08-22 DIAGNOSIS — Z79899 Other long term (current) drug therapy: Secondary | ICD-10-CM | POA: Diagnosis not present

## 2018-08-22 DIAGNOSIS — D3A00 Benign carcinoid tumor of unspecified site: Secondary | ICD-10-CM | POA: Diagnosis not present

## 2018-08-22 DIAGNOSIS — D3A029 Benign carcinoid tumor of the large intestine, unspecified portion: Secondary | ICD-10-CM | POA: Diagnosis not present

## 2018-08-22 DIAGNOSIS — R590 Localized enlarged lymph nodes: Secondary | ICD-10-CM | POA: Diagnosis not present

## 2018-08-29 ENCOUNTER — Ambulatory Visit (HOSPITAL_COMMUNITY): Admission: RE | Admit: 2018-08-29 | Payer: PPO | Source: Ambulatory Visit

## 2018-08-29 ENCOUNTER — Ambulatory Visit (HOSPITAL_COMMUNITY)
Admission: RE | Admit: 2018-08-29 | Discharge: 2018-08-29 | Disposition: A | Discharge status: Home / Self Care | Payer: PPO | Source: Ambulatory Visit | Attending: Internal Medicine | Admitting: Internal Medicine

## 2018-08-29 DIAGNOSIS — R928 Other abnormal and inconclusive findings on diagnostic imaging of breast: Secondary | ICD-10-CM | POA: Insufficient documentation

## 2018-08-29 DIAGNOSIS — R922 Inconclusive mammogram: Secondary | ICD-10-CM | POA: Diagnosis not present

## 2018-08-31 DIAGNOSIS — R87610 Atypical squamous cells of undetermined significance on cytologic smear of cervix (ASC-US): Secondary | ICD-10-CM | POA: Diagnosis not present

## 2018-08-31 DIAGNOSIS — R87810 Cervical high risk human papillomavirus (HPV) DNA test positive: Secondary | ICD-10-CM | POA: Diagnosis not present

## 2018-09-01 DIAGNOSIS — E340 Carcinoid syndrome: Secondary | ICD-10-CM | POA: Diagnosis not present

## 2018-09-01 DIAGNOSIS — M5136 Other intervertebral disc degeneration, lumbar region: Secondary | ICD-10-CM | POA: Diagnosis not present

## 2018-09-01 DIAGNOSIS — Z79899 Other long term (current) drug therapy: Secondary | ICD-10-CM | POA: Diagnosis not present

## 2018-09-01 DIAGNOSIS — G894 Chronic pain syndrome: Secondary | ICD-10-CM | POA: Diagnosis not present

## 2018-09-21 DIAGNOSIS — Z5111 Encounter for antineoplastic chemotherapy: Secondary | ICD-10-CM | POA: Diagnosis not present

## 2018-09-21 DIAGNOSIS — D3A029 Benign carcinoid tumor of the large intestine, unspecified portion: Secondary | ICD-10-CM | POA: Diagnosis not present

## 2018-09-21 DIAGNOSIS — Z79899 Other long term (current) drug therapy: Secondary | ICD-10-CM | POA: Diagnosis not present

## 2018-09-22 DIAGNOSIS — F419 Anxiety disorder, unspecified: Secondary | ICD-10-CM | POA: Diagnosis not present

## 2018-09-22 DIAGNOSIS — R7301 Impaired fasting glucose: Secondary | ICD-10-CM | POA: Diagnosis not present

## 2018-09-22 DIAGNOSIS — E782 Mixed hyperlipidemia: Secondary | ICD-10-CM | POA: Diagnosis not present

## 2018-09-22 DIAGNOSIS — F339 Major depressive disorder, recurrent, unspecified: Secondary | ICD-10-CM | POA: Diagnosis not present

## 2018-09-22 DIAGNOSIS — K219 Gastro-esophageal reflux disease without esophagitis: Secondary | ICD-10-CM | POA: Diagnosis not present

## 2018-09-22 DIAGNOSIS — I10 Essential (primary) hypertension: Secondary | ICD-10-CM | POA: Diagnosis not present

## 2018-10-17 DIAGNOSIS — Z5111 Encounter for antineoplastic chemotherapy: Secondary | ICD-10-CM | POA: Diagnosis not present

## 2018-10-17 DIAGNOSIS — D3A029 Benign carcinoid tumor of the large intestine, unspecified portion: Secondary | ICD-10-CM | POA: Diagnosis not present

## 2018-10-17 DIAGNOSIS — Z79899 Other long term (current) drug therapy: Secondary | ICD-10-CM | POA: Diagnosis not present

## 2018-11-01 DIAGNOSIS — I10 Essential (primary) hypertension: Secondary | ICD-10-CM | POA: Diagnosis not present

## 2018-11-01 DIAGNOSIS — K219 Gastro-esophageal reflux disease without esophagitis: Secondary | ICD-10-CM | POA: Diagnosis not present

## 2018-11-01 DIAGNOSIS — F419 Anxiety disorder, unspecified: Secondary | ICD-10-CM | POA: Diagnosis not present

## 2018-11-01 DIAGNOSIS — E782 Mixed hyperlipidemia: Secondary | ICD-10-CM | POA: Diagnosis not present

## 2018-11-01 DIAGNOSIS — R7301 Impaired fasting glucose: Secondary | ICD-10-CM | POA: Diagnosis not present

## 2018-11-01 DIAGNOSIS — F339 Major depressive disorder, recurrent, unspecified: Secondary | ICD-10-CM | POA: Diagnosis not present

## 2018-11-14 DIAGNOSIS — Z79899 Other long term (current) drug therapy: Secondary | ICD-10-CM | POA: Diagnosis not present

## 2018-11-14 DIAGNOSIS — D3A029 Benign carcinoid tumor of the large intestine, unspecified portion: Secondary | ICD-10-CM | POA: Diagnosis not present

## 2018-11-15 DIAGNOSIS — F339 Major depressive disorder, recurrent, unspecified: Secondary | ICD-10-CM | POA: Diagnosis not present

## 2018-11-15 DIAGNOSIS — E782 Mixed hyperlipidemia: Secondary | ICD-10-CM | POA: Diagnosis not present

## 2018-11-15 DIAGNOSIS — I10 Essential (primary) hypertension: Secondary | ICD-10-CM | POA: Diagnosis not present

## 2018-11-15 DIAGNOSIS — K219 Gastro-esophageal reflux disease without esophagitis: Secondary | ICD-10-CM | POA: Diagnosis not present

## 2018-11-15 DIAGNOSIS — F419 Anxiety disorder, unspecified: Secondary | ICD-10-CM | POA: Diagnosis not present

## 2018-11-15 DIAGNOSIS — R7301 Impaired fasting glucose: Secondary | ICD-10-CM | POA: Diagnosis not present

## 2018-12-13 DIAGNOSIS — R7301 Impaired fasting glucose: Secondary | ICD-10-CM | POA: Diagnosis not present

## 2018-12-13 DIAGNOSIS — I10 Essential (primary) hypertension: Secondary | ICD-10-CM | POA: Diagnosis not present

## 2018-12-13 DIAGNOSIS — K219 Gastro-esophageal reflux disease without esophagitis: Secondary | ICD-10-CM | POA: Diagnosis not present

## 2018-12-13 DIAGNOSIS — E782 Mixed hyperlipidemia: Secondary | ICD-10-CM | POA: Diagnosis not present

## 2018-12-13 DIAGNOSIS — F419 Anxiety disorder, unspecified: Secondary | ICD-10-CM | POA: Diagnosis not present

## 2018-12-13 DIAGNOSIS — F339 Major depressive disorder, recurrent, unspecified: Secondary | ICD-10-CM | POA: Diagnosis not present

## 2018-12-19 DIAGNOSIS — R590 Localized enlarged lymph nodes: Secondary | ICD-10-CM | POA: Diagnosis not present

## 2018-12-19 DIAGNOSIS — T451X5D Adverse effect of antineoplastic and immunosuppressive drugs, subsequent encounter: Secondary | ICD-10-CM | POA: Diagnosis not present

## 2018-12-19 DIAGNOSIS — Z0000 Encounter for general adult medical examination without abnormal findings: Secondary | ICD-10-CM | POA: Diagnosis not present

## 2018-12-19 DIAGNOSIS — E1122 Type 2 diabetes mellitus with diabetic chronic kidney disease: Secondary | ICD-10-CM | POA: Diagnosis not present

## 2018-12-19 DIAGNOSIS — M9902 Segmental and somatic dysfunction of thoracic region: Secondary | ICD-10-CM | POA: Diagnosis not present

## 2018-12-19 DIAGNOSIS — R0982 Postnasal drip: Secondary | ICD-10-CM | POA: Diagnosis not present

## 2018-12-19 DIAGNOSIS — K5900 Constipation, unspecified: Secondary | ICD-10-CM | POA: Diagnosis not present

## 2018-12-19 DIAGNOSIS — M9903 Segmental and somatic dysfunction of lumbar region: Secondary | ICD-10-CM | POA: Diagnosis not present

## 2018-12-19 DIAGNOSIS — K869 Disease of pancreas, unspecified: Secondary | ICD-10-CM | POA: Diagnosis not present

## 2018-12-19 DIAGNOSIS — N183 Chronic kidney disease, stage 3 unspecified: Secondary | ICD-10-CM | POA: Diagnosis not present

## 2018-12-19 DIAGNOSIS — I129 Hypertensive chronic kidney disease with stage 1 through stage 4 chronic kidney disease, or unspecified chronic kidney disease: Secondary | ICD-10-CM | POA: Diagnosis not present

## 2018-12-19 DIAGNOSIS — J3089 Other allergic rhinitis: Secondary | ICD-10-CM | POA: Diagnosis not present

## 2018-12-19 DIAGNOSIS — R001 Bradycardia, unspecified: Secondary | ICD-10-CM | POA: Diagnosis not present

## 2018-12-19 DIAGNOSIS — D3A029 Benign carcinoid tumor of the large intestine, unspecified portion: Secondary | ICD-10-CM | POA: Diagnosis not present

## 2018-12-19 DIAGNOSIS — J320 Chronic maxillary sinusitis: Secondary | ICD-10-CM | POA: Diagnosis not present

## 2018-12-19 DIAGNOSIS — J310 Chronic rhinitis: Secondary | ICD-10-CM | POA: Diagnosis not present

## 2018-12-19 DIAGNOSIS — E785 Hyperlipidemia, unspecified: Secondary | ICD-10-CM | POA: Diagnosis not present

## 2018-12-19 DIAGNOSIS — M5415 Radiculopathy, thoracolumbar region: Secondary | ICD-10-CM | POA: Diagnosis not present

## 2018-12-19 DIAGNOSIS — K319 Disease of stomach and duodenum, unspecified: Secondary | ICD-10-CM | POA: Diagnosis not present

## 2018-12-19 DIAGNOSIS — K5903 Drug induced constipation: Secondary | ICD-10-CM | POA: Diagnosis not present

## 2018-12-19 DIAGNOSIS — M9905 Segmental and somatic dysfunction of pelvic region: Secondary | ICD-10-CM | POA: Diagnosis not present

## 2018-12-19 DIAGNOSIS — J321 Chronic frontal sinusitis: Secondary | ICD-10-CM | POA: Diagnosis not present

## 2018-12-19 DIAGNOSIS — I10 Essential (primary) hypertension: Secondary | ICD-10-CM | POA: Diagnosis not present

## 2018-12-19 DIAGNOSIS — E559 Vitamin D deficiency, unspecified: Secondary | ICD-10-CM | POA: Diagnosis not present

## 2018-12-19 DIAGNOSIS — J301 Allergic rhinitis due to pollen: Secondary | ICD-10-CM | POA: Diagnosis not present

## 2018-12-19 DIAGNOSIS — I2510 Atherosclerotic heart disease of native coronary artery without angina pectoris: Secondary | ICD-10-CM | POA: Diagnosis not present

## 2018-12-19 DIAGNOSIS — M5417 Radiculopathy, lumbosacral region: Secondary | ICD-10-CM | POA: Diagnosis not present

## 2018-12-19 DIAGNOSIS — M5136 Other intervertebral disc degeneration, lumbar region: Secondary | ICD-10-CM | POA: Diagnosis not present

## 2018-12-19 DIAGNOSIS — C180 Malignant neoplasm of cecum: Secondary | ICD-10-CM | POA: Diagnosis not present

## 2018-12-19 DIAGNOSIS — K3189 Other diseases of stomach and duodenum: Secondary | ICD-10-CM | POA: Diagnosis not present

## 2018-12-19 DIAGNOSIS — C7A012 Malignant carcinoid tumor of the ileum: Secondary | ICD-10-CM | POA: Diagnosis not present

## 2018-12-20 DIAGNOSIS — G9009 Other idiopathic peripheral autonomic neuropathy: Secondary | ICD-10-CM | POA: Diagnosis not present

## 2018-12-20 DIAGNOSIS — G2581 Restless legs syndrome: Secondary | ICD-10-CM | POA: Diagnosis not present

## 2018-12-20 DIAGNOSIS — I10 Essential (primary) hypertension: Secondary | ICD-10-CM | POA: Diagnosis not present

## 2018-12-20 DIAGNOSIS — E538 Deficiency of other specified B group vitamins: Secondary | ICD-10-CM | POA: Diagnosis not present

## 2018-12-20 DIAGNOSIS — G894 Chronic pain syndrome: Secondary | ICD-10-CM | POA: Diagnosis not present

## 2018-12-20 DIAGNOSIS — J302 Other seasonal allergic rhinitis: Secondary | ICD-10-CM | POA: Diagnosis not present

## 2018-12-20 DIAGNOSIS — K219 Gastro-esophageal reflux disease without esophagitis: Secondary | ICD-10-CM | POA: Diagnosis not present

## 2018-12-20 DIAGNOSIS — K581 Irritable bowel syndrome with constipation: Secondary | ICD-10-CM | POA: Diagnosis not present

## 2018-12-20 DIAGNOSIS — F331 Major depressive disorder, recurrent, moderate: Secondary | ICD-10-CM | POA: Diagnosis not present

## 2018-12-20 DIAGNOSIS — J019 Acute sinusitis, unspecified: Secondary | ICD-10-CM | POA: Diagnosis not present

## 2019-01-05 DIAGNOSIS — E340 Carcinoid syndrome: Secondary | ICD-10-CM | POA: Diagnosis not present

## 2019-01-05 DIAGNOSIS — Z79899 Other long term (current) drug therapy: Secondary | ICD-10-CM | POA: Diagnosis not present

## 2019-01-05 DIAGNOSIS — Z9189 Other specified personal risk factors, not elsewhere classified: Secondary | ICD-10-CM | POA: Diagnosis not present

## 2019-01-05 DIAGNOSIS — G894 Chronic pain syndrome: Secondary | ICD-10-CM | POA: Diagnosis not present

## 2019-01-08 DIAGNOSIS — G894 Chronic pain syndrome: Secondary | ICD-10-CM | POA: Diagnosis not present

## 2019-01-08 DIAGNOSIS — Z79899 Other long term (current) drug therapy: Secondary | ICD-10-CM | POA: Diagnosis not present

## 2019-01-17 DIAGNOSIS — E782 Mixed hyperlipidemia: Secondary | ICD-10-CM | POA: Diagnosis not present

## 2019-01-17 DIAGNOSIS — I10 Essential (primary) hypertension: Secondary | ICD-10-CM | POA: Diagnosis not present

## 2019-01-17 DIAGNOSIS — K219 Gastro-esophageal reflux disease without esophagitis: Secondary | ICD-10-CM | POA: Diagnosis not present

## 2019-01-17 DIAGNOSIS — F339 Major depressive disorder, recurrent, unspecified: Secondary | ICD-10-CM | POA: Diagnosis not present

## 2019-01-25 DIAGNOSIS — Z0000 Encounter for general adult medical examination without abnormal findings: Secondary | ICD-10-CM | POA: Diagnosis not present

## 2019-01-30 DIAGNOSIS — C180 Malignant neoplasm of cecum: Secondary | ICD-10-CM | POA: Diagnosis not present

## 2019-01-30 DIAGNOSIS — Z5111 Encounter for antineoplastic chemotherapy: Secondary | ICD-10-CM | POA: Diagnosis not present

## 2019-02-12 DIAGNOSIS — K219 Gastro-esophageal reflux disease without esophagitis: Secondary | ICD-10-CM | POA: Diagnosis not present

## 2019-02-12 DIAGNOSIS — I10 Essential (primary) hypertension: Secondary | ICD-10-CM | POA: Diagnosis not present

## 2019-02-12 DIAGNOSIS — E782 Mixed hyperlipidemia: Secondary | ICD-10-CM | POA: Diagnosis not present

## 2019-02-12 DIAGNOSIS — F339 Major depressive disorder, recurrent, unspecified: Secondary | ICD-10-CM | POA: Diagnosis not present

## 2019-02-14 DIAGNOSIS — Z79899 Other long term (current) drug therapy: Secondary | ICD-10-CM | POA: Diagnosis not present

## 2019-02-14 DIAGNOSIS — G894 Chronic pain syndrome: Secondary | ICD-10-CM | POA: Diagnosis not present

## 2019-02-14 DIAGNOSIS — E340 Carcinoid syndrome: Secondary | ICD-10-CM | POA: Diagnosis not present

## 2019-03-07 DIAGNOSIS — R87622 Low grade squamous intraepithelial lesion on cytologic smear of vagina (LGSIL): Secondary | ICD-10-CM | POA: Diagnosis not present

## 2019-03-13 DIAGNOSIS — Z5111 Encounter for antineoplastic chemotherapy: Secondary | ICD-10-CM | POA: Diagnosis not present

## 2019-03-13 DIAGNOSIS — C180 Malignant neoplasm of cecum: Secondary | ICD-10-CM | POA: Diagnosis not present

## 2019-03-28 DIAGNOSIS — Z79899 Other long term (current) drug therapy: Secondary | ICD-10-CM | POA: Diagnosis not present

## 2019-04-10 DIAGNOSIS — K219 Gastro-esophageal reflux disease without esophagitis: Secondary | ICD-10-CM | POA: Diagnosis not present

## 2019-04-10 DIAGNOSIS — D3A00 Benign carcinoid tumor of unspecified site: Secondary | ICD-10-CM | POA: Diagnosis not present

## 2019-04-10 DIAGNOSIS — R52 Pain, unspecified: Secondary | ICD-10-CM | POA: Diagnosis not present

## 2019-04-10 DIAGNOSIS — I10 Essential (primary) hypertension: Secondary | ICD-10-CM | POA: Diagnosis not present

## 2019-04-10 DIAGNOSIS — Z79899 Other long term (current) drug therapy: Secondary | ICD-10-CM | POA: Diagnosis not present

## 2019-04-10 DIAGNOSIS — K869 Disease of pancreas, unspecified: Secondary | ICD-10-CM | POA: Diagnosis not present

## 2019-04-10 DIAGNOSIS — D3A029 Benign carcinoid tumor of the large intestine, unspecified portion: Secondary | ICD-10-CM | POA: Diagnosis not present

## 2019-04-10 DIAGNOSIS — R590 Localized enlarged lymph nodes: Secondary | ICD-10-CM | POA: Diagnosis not present

## 2019-04-10 DIAGNOSIS — E782 Mixed hyperlipidemia: Secondary | ICD-10-CM | POA: Diagnosis not present

## 2019-04-10 DIAGNOSIS — K5900 Constipation, unspecified: Secondary | ICD-10-CM | POA: Diagnosis not present

## 2019-04-10 DIAGNOSIS — K319 Disease of stomach and duodenum, unspecified: Secondary | ICD-10-CM | POA: Diagnosis not present

## 2019-04-10 DIAGNOSIS — F339 Major depressive disorder, recurrent, unspecified: Secondary | ICD-10-CM | POA: Diagnosis not present

## 2019-04-16 DIAGNOSIS — E782 Mixed hyperlipidemia: Secondary | ICD-10-CM | POA: Diagnosis not present

## 2019-04-16 DIAGNOSIS — I10 Essential (primary) hypertension: Secondary | ICD-10-CM | POA: Diagnosis not present

## 2019-04-16 DIAGNOSIS — K219 Gastro-esophageal reflux disease without esophagitis: Secondary | ICD-10-CM | POA: Diagnosis not present

## 2019-04-16 DIAGNOSIS — F339 Major depressive disorder, recurrent, unspecified: Secondary | ICD-10-CM | POA: Diagnosis not present

## 2019-04-20 DIAGNOSIS — Z760 Encounter for issue of repeat prescription: Secondary | ICD-10-CM | POA: Diagnosis not present

## 2019-04-20 DIAGNOSIS — G894 Chronic pain syndrome: Secondary | ICD-10-CM | POA: Diagnosis not present

## 2019-04-20 DIAGNOSIS — M5136 Other intervertebral disc degeneration, lumbar region: Secondary | ICD-10-CM | POA: Diagnosis not present

## 2019-04-20 DIAGNOSIS — Z79899 Other long term (current) drug therapy: Secondary | ICD-10-CM | POA: Diagnosis not present

## 2019-05-07 DIAGNOSIS — C180 Malignant neoplasm of cecum: Secondary | ICD-10-CM | POA: Diagnosis not present

## 2019-05-07 DIAGNOSIS — Z5111 Encounter for antineoplastic chemotherapy: Secondary | ICD-10-CM | POA: Diagnosis not present

## 2019-05-17 DIAGNOSIS — E340 Carcinoid syndrome: Secondary | ICD-10-CM | POA: Diagnosis not present

## 2019-05-17 DIAGNOSIS — G894 Chronic pain syndrome: Secondary | ICD-10-CM | POA: Diagnosis not present

## 2019-05-17 DIAGNOSIS — Z760 Encounter for issue of repeat prescription: Secondary | ICD-10-CM | POA: Diagnosis not present

## 2019-05-17 DIAGNOSIS — Z79899 Other long term (current) drug therapy: Secondary | ICD-10-CM | POA: Diagnosis not present

## 2019-06-05 DIAGNOSIS — D3A029 Benign carcinoid tumor of the large intestine, unspecified portion: Secondary | ICD-10-CM | POA: Diagnosis not present

## 2019-06-15 DIAGNOSIS — G894 Chronic pain syndrome: Secondary | ICD-10-CM | POA: Diagnosis not present

## 2019-06-15 DIAGNOSIS — M792 Neuralgia and neuritis, unspecified: Secondary | ICD-10-CM | POA: Diagnosis not present

## 2019-06-15 DIAGNOSIS — Z23 Encounter for immunization: Secondary | ICD-10-CM | POA: Diagnosis not present

## 2019-06-15 DIAGNOSIS — Z760 Encounter for issue of repeat prescription: Secondary | ICD-10-CM | POA: Diagnosis not present

## 2019-06-15 DIAGNOSIS — Z79899 Other long term (current) drug therapy: Secondary | ICD-10-CM | POA: Diagnosis not present

## 2019-07-02 DIAGNOSIS — K219 Gastro-esophageal reflux disease without esophagitis: Secondary | ICD-10-CM | POA: Diagnosis not present

## 2019-07-02 DIAGNOSIS — I10 Essential (primary) hypertension: Secondary | ICD-10-CM | POA: Diagnosis not present

## 2019-07-02 DIAGNOSIS — E782 Mixed hyperlipidemia: Secondary | ICD-10-CM | POA: Diagnosis not present

## 2019-07-02 DIAGNOSIS — F339 Major depressive disorder, recurrent, unspecified: Secondary | ICD-10-CM | POA: Diagnosis not present

## 2019-07-17 DIAGNOSIS — E340 Carcinoid syndrome: Secondary | ICD-10-CM | POA: Diagnosis not present

## 2019-07-17 DIAGNOSIS — G894 Chronic pain syndrome: Secondary | ICD-10-CM | POA: Diagnosis not present

## 2019-07-17 DIAGNOSIS — Z79899 Other long term (current) drug therapy: Secondary | ICD-10-CM | POA: Diagnosis not present

## 2019-07-17 DIAGNOSIS — M792 Neuralgia and neuritis, unspecified: Secondary | ICD-10-CM | POA: Diagnosis not present

## 2019-07-27 DIAGNOSIS — E782 Mixed hyperlipidemia: Secondary | ICD-10-CM | POA: Diagnosis not present

## 2019-07-27 DIAGNOSIS — I10 Essential (primary) hypertension: Secondary | ICD-10-CM | POA: Diagnosis not present

## 2019-07-27 DIAGNOSIS — K219 Gastro-esophageal reflux disease without esophagitis: Secondary | ICD-10-CM | POA: Diagnosis not present

## 2019-07-27 DIAGNOSIS — F339 Major depressive disorder, recurrent, unspecified: Secondary | ICD-10-CM | POA: Diagnosis not present

## 2019-07-31 DIAGNOSIS — D3A029 Benign carcinoid tumor of the large intestine, unspecified portion: Secondary | ICD-10-CM | POA: Diagnosis not present

## 2019-07-31 DIAGNOSIS — N281 Cyst of kidney, acquired: Secondary | ICD-10-CM | POA: Diagnosis not present

## 2019-07-31 DIAGNOSIS — Z981 Arthrodesis status: Secondary | ICD-10-CM | POA: Diagnosis not present

## 2019-07-31 DIAGNOSIS — K5900 Constipation, unspecified: Secondary | ICD-10-CM | POA: Diagnosis not present

## 2019-07-31 DIAGNOSIS — Z9049 Acquired absence of other specified parts of digestive tract: Secondary | ICD-10-CM | POA: Diagnosis not present

## 2019-07-31 DIAGNOSIS — D3A019 Benign carcinoid tumor of the small intestine, unspecified portion: Secondary | ICD-10-CM | POA: Diagnosis not present

## 2019-07-31 DIAGNOSIS — G629 Polyneuropathy, unspecified: Secondary | ICD-10-CM | POA: Diagnosis not present

## 2019-07-31 DIAGNOSIS — Z79899 Other long term (current) drug therapy: Secondary | ICD-10-CM | POA: Diagnosis not present

## 2019-07-31 DIAGNOSIS — K3189 Other diseases of stomach and duodenum: Secondary | ICD-10-CM | POA: Diagnosis not present

## 2019-07-31 DIAGNOSIS — Z5111 Encounter for antineoplastic chemotherapy: Secondary | ICD-10-CM | POA: Diagnosis not present

## 2019-07-31 DIAGNOSIS — R590 Localized enlarged lymph nodes: Secondary | ICD-10-CM | POA: Diagnosis not present

## 2019-07-31 DIAGNOSIS — D3A00 Benign carcinoid tumor of unspecified site: Secondary | ICD-10-CM | POA: Diagnosis not present

## 2019-07-31 DIAGNOSIS — R002 Palpitations: Secondary | ICD-10-CM | POA: Diagnosis not present

## 2019-07-31 DIAGNOSIS — I7090 Unspecified atherosclerosis: Secondary | ICD-10-CM | POA: Diagnosis not present

## 2019-07-31 DIAGNOSIS — R232 Flushing: Secondary | ICD-10-CM | POA: Diagnosis not present

## 2019-07-31 DIAGNOSIS — K5730 Diverticulosis of large intestine without perforation or abscess without bleeding: Secondary | ICD-10-CM | POA: Diagnosis not present

## 2019-08-06 DIAGNOSIS — R7301 Impaired fasting glucose: Secondary | ICD-10-CM | POA: Diagnosis not present

## 2019-08-06 DIAGNOSIS — G130 Paraneoplastic neuromyopathy and neuropathy: Secondary | ICD-10-CM | POA: Diagnosis not present

## 2019-08-06 DIAGNOSIS — R599 Enlarged lymph nodes, unspecified: Secondary | ICD-10-CM | POA: Diagnosis not present

## 2019-08-06 DIAGNOSIS — F419 Anxiety disorder, unspecified: Secondary | ICD-10-CM | POA: Diagnosis not present

## 2019-08-06 DIAGNOSIS — D509 Iron deficiency anemia, unspecified: Secondary | ICD-10-CM | POA: Diagnosis not present

## 2019-08-06 DIAGNOSIS — Z23 Encounter for immunization: Secondary | ICD-10-CM | POA: Diagnosis not present

## 2019-08-06 DIAGNOSIS — K219 Gastro-esophageal reflux disease without esophagitis: Secondary | ICD-10-CM | POA: Diagnosis not present

## 2019-08-06 DIAGNOSIS — C259 Malignant neoplasm of pancreas, unspecified: Secondary | ICD-10-CM | POA: Diagnosis not present

## 2019-08-06 DIAGNOSIS — G629 Polyneuropathy, unspecified: Secondary | ICD-10-CM | POA: Diagnosis not present

## 2019-08-06 DIAGNOSIS — F339 Major depressive disorder, recurrent, unspecified: Secondary | ICD-10-CM | POA: Diagnosis not present

## 2019-08-06 DIAGNOSIS — R1011 Right upper quadrant pain: Secondary | ICD-10-CM | POA: Diagnosis not present

## 2019-08-06 DIAGNOSIS — G4733 Obstructive sleep apnea (adult) (pediatric): Secondary | ICD-10-CM | POA: Diagnosis not present

## 2019-08-06 DIAGNOSIS — Z6828 Body mass index (BMI) 28.0-28.9, adult: Secondary | ICD-10-CM | POA: Diagnosis not present

## 2019-08-08 ENCOUNTER — Other Ambulatory Visit: Payer: Self-pay

## 2019-08-16 DIAGNOSIS — G894 Chronic pain syndrome: Secondary | ICD-10-CM | POA: Diagnosis not present

## 2019-08-16 DIAGNOSIS — M5136 Other intervertebral disc degeneration, lumbar region: Secondary | ICD-10-CM | POA: Diagnosis not present

## 2019-08-16 DIAGNOSIS — Z79899 Other long term (current) drug therapy: Secondary | ICD-10-CM | POA: Diagnosis not present

## 2019-08-16 DIAGNOSIS — M5030 Other cervical disc degeneration, unspecified cervical region: Secondary | ICD-10-CM | POA: Diagnosis not present

## 2019-08-28 DIAGNOSIS — Z79899 Other long term (current) drug therapy: Secondary | ICD-10-CM | POA: Diagnosis not present

## 2019-08-28 DIAGNOSIS — D3A029 Benign carcinoid tumor of the large intestine, unspecified portion: Secondary | ICD-10-CM | POA: Diagnosis not present

## 2019-09-04 DIAGNOSIS — K219 Gastro-esophageal reflux disease without esophagitis: Secondary | ICD-10-CM | POA: Diagnosis not present

## 2019-09-04 DIAGNOSIS — F339 Major depressive disorder, recurrent, unspecified: Secondary | ICD-10-CM | POA: Diagnosis not present

## 2019-09-04 DIAGNOSIS — I10 Essential (primary) hypertension: Secondary | ICD-10-CM | POA: Diagnosis not present

## 2019-09-04 DIAGNOSIS — E782 Mixed hyperlipidemia: Secondary | ICD-10-CM | POA: Diagnosis not present

## 2019-09-04 IMAGING — MG DIGITAL DIAGNOSTIC UNILATERAL RIGHT MAMMOGRAM WITH TOMO AND CAD
6 series · 6 of 18 positions shown · non-contrast
Comparison: Previous exam(s).

CLINICAL DATA: Screening recall for a possible architectural
distortion and asymmetry in the right breast.Patient has no breast
complaints.

EXAM:
DIGITAL DIAGNOSTIC UNILATERAL RIGHT MAMMOGRAM WITH CAD AND TOMO

[R ML synth-2D]
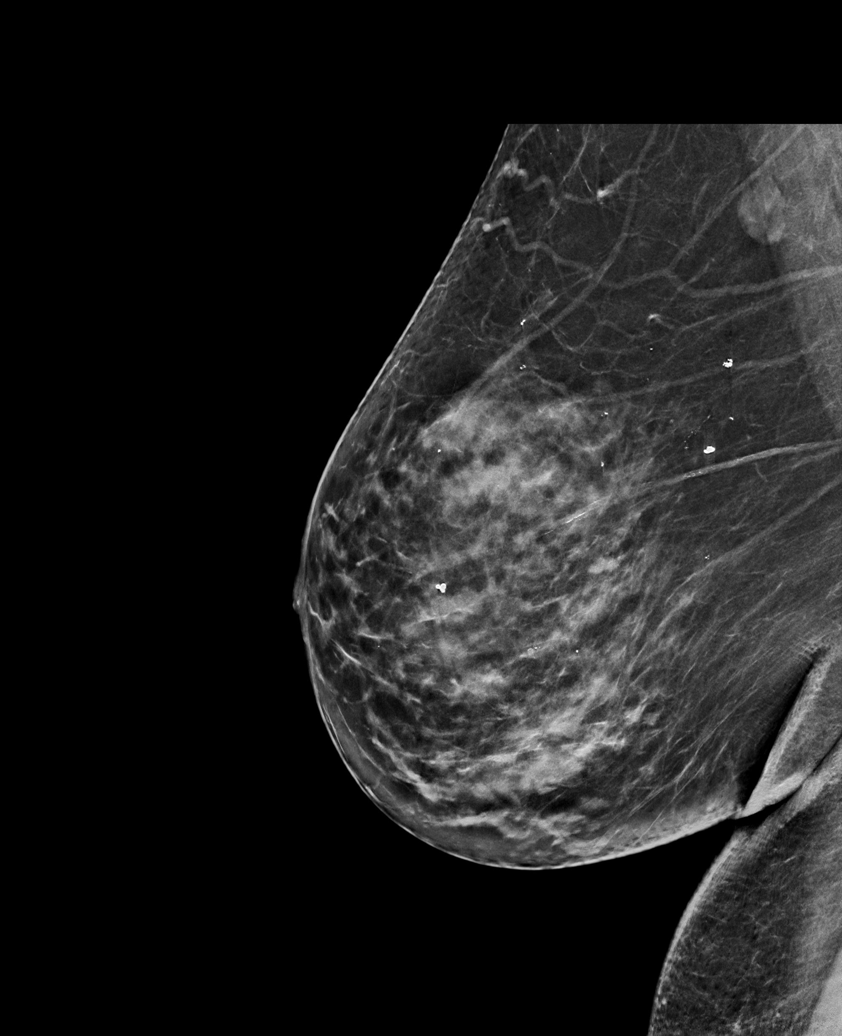

[R CC synth-2D (1 of 2)]
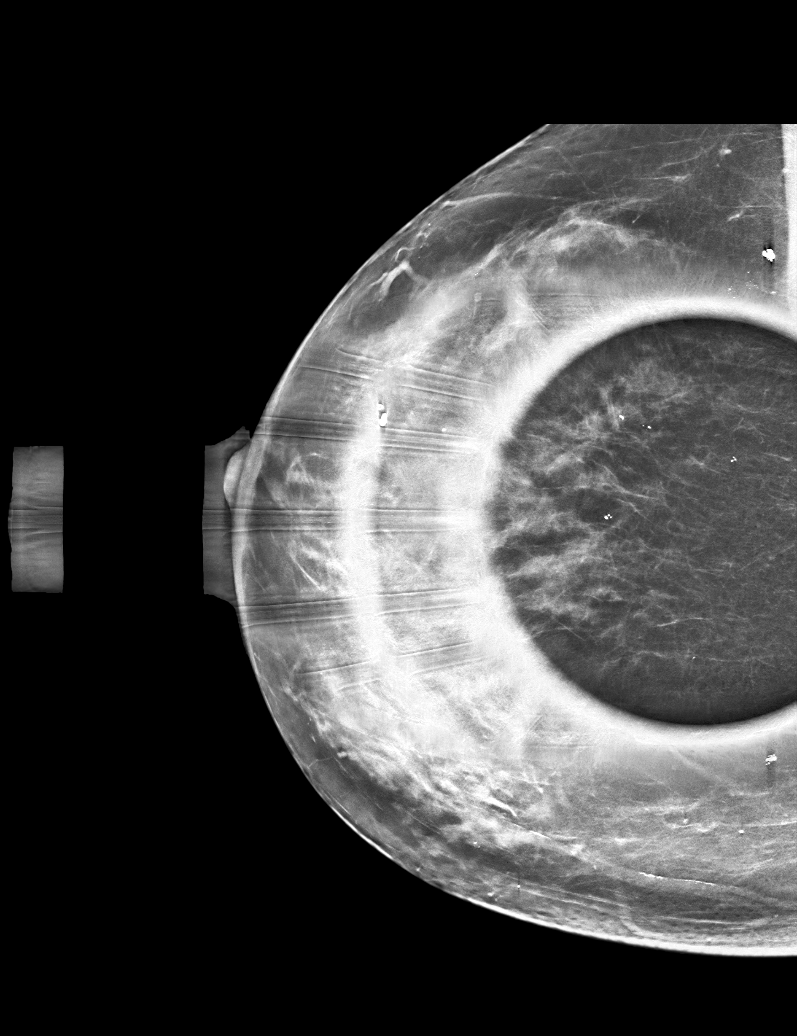

[R CC synth-2D (2 of 2)]
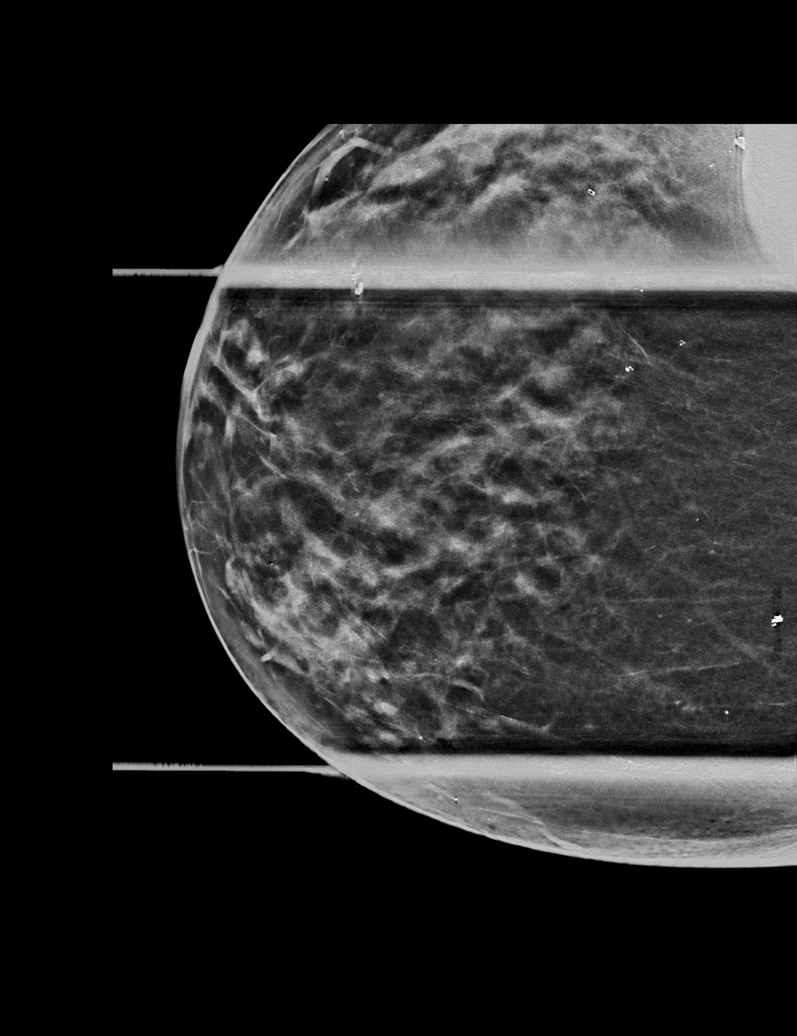

[R ML tomo · tomo slice 37/74.0]
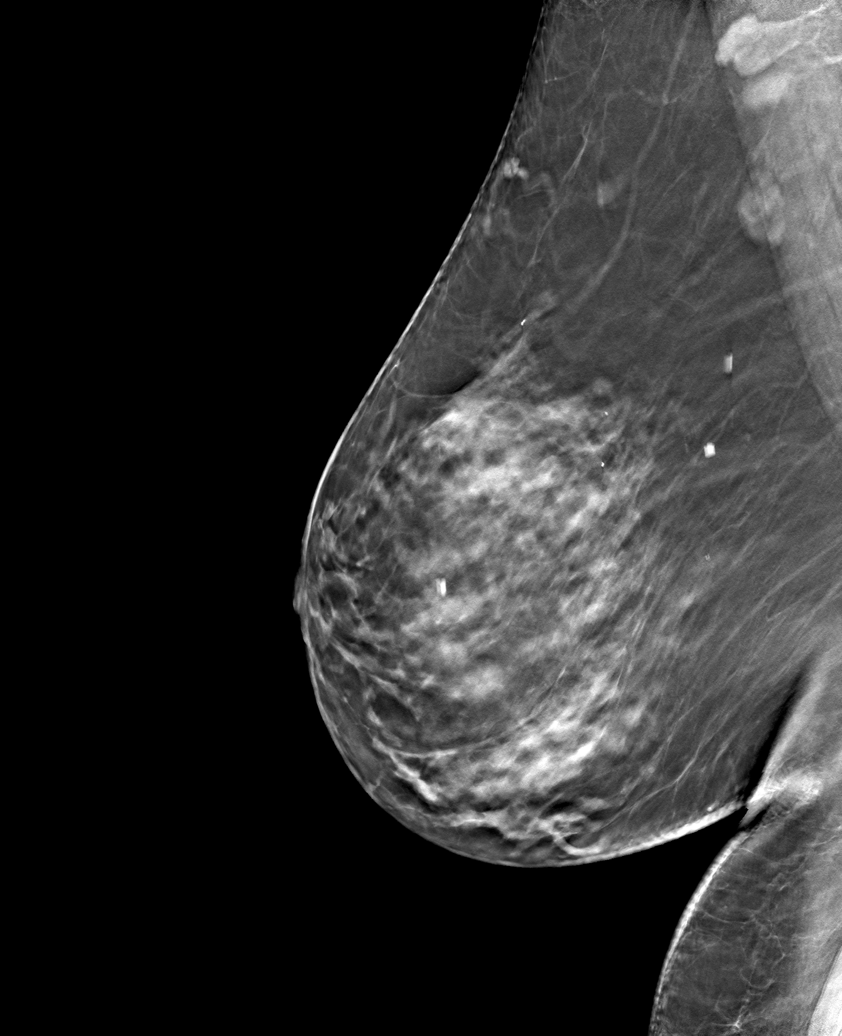

[R CC tomo (1 of 2) · tomo slice 31/60.0]
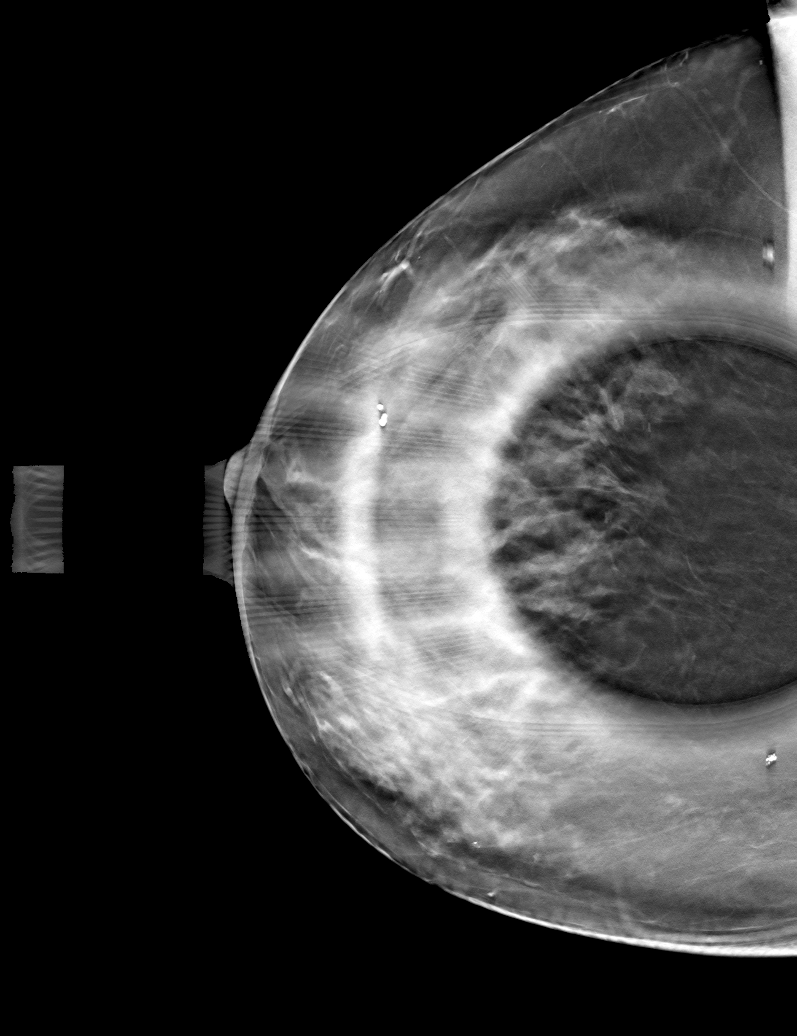

[R CC tomo (2 of 2) · tomo slice 29/58.0]
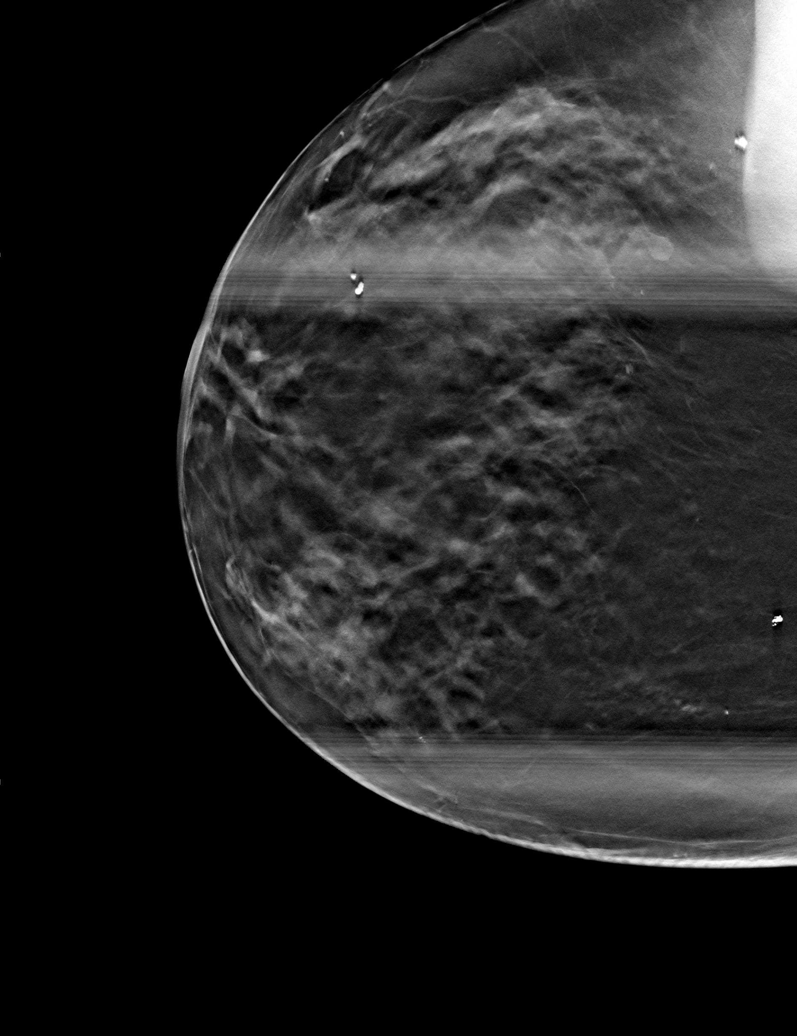

[6 of 18 positions shown; findings below may reference images not displayed]

ACR Breast Density Category c: The breast tissue is heterogeneously
dense, which may obscure small masses.
FINDINGS: The possible distortion and asymmetry disperses with spot
compression imaging consistent with normal fibroglandular tissue.
There is no persistent distortion. There are no suspicious masses
and there are no new or suspicious calcifications. There has been no
significant mammographic change from prior mammograms.

Mammographic images were processed with CAD.
IMPRESSION: No evidence of breast malignancy.

RECOMMENDATION:
Screening mammogram in one year.(Code:[id number])

I have discussed the findings and recommendations with the patient.
Results were also provided in writing at the conclusion of the
visit. If applicable, a reminder letter will be sent to the patient
regarding the next appointment.

BI-RADS CATEGORY  1: Negative.

## 2019-09-17 ENCOUNTER — Other Ambulatory Visit (HOSPITAL_COMMUNITY): Payer: Self-pay | Admitting: Unknown Physician Specialty

## 2019-09-17 DIAGNOSIS — Z1231 Encounter for screening mammogram for malignant neoplasm of breast: Secondary | ICD-10-CM

## 2019-09-18 DIAGNOSIS — Z79899 Other long term (current) drug therapy: Secondary | ICD-10-CM | POA: Diagnosis not present

## 2019-09-18 DIAGNOSIS — M792 Neuralgia and neuritis, unspecified: Secondary | ICD-10-CM | POA: Diagnosis not present

## 2019-09-18 DIAGNOSIS — G894 Chronic pain syndrome: Secondary | ICD-10-CM | POA: Diagnosis not present

## 2019-09-18 DIAGNOSIS — M5030 Other cervical disc degeneration, unspecified cervical region: Secondary | ICD-10-CM | POA: Diagnosis not present

## 2019-09-20 ENCOUNTER — Ambulatory Visit (HOSPITAL_COMMUNITY)
Admission: RE | Admit: 2019-09-20 | Discharge: 2019-09-20 | Disposition: A | Discharge status: Home / Self Care | Payer: PPO | Source: Ambulatory Visit | Attending: Unknown Physician Specialty | Admitting: Unknown Physician Specialty

## 2019-09-20 ENCOUNTER — Other Ambulatory Visit: Payer: Self-pay

## 2019-09-20 DIAGNOSIS — Z1231 Encounter for screening mammogram for malignant neoplasm of breast: Secondary | ICD-10-CM | POA: Diagnosis not present

## 2019-09-24 ENCOUNTER — Encounter (HOSPITAL_COMMUNITY): Payer: Self-pay

## 2019-09-25 ENCOUNTER — Other Ambulatory Visit (HOSPITAL_COMMUNITY): Payer: Self-pay | Admitting: Unknown Physician Specialty

## 2019-09-25 DIAGNOSIS — R928 Other abnormal and inconclusive findings on diagnostic imaging of breast: Secondary | ICD-10-CM

## 2019-09-25 DIAGNOSIS — D3A029 Benign carcinoid tumor of the large intestine, unspecified portion: Secondary | ICD-10-CM | POA: Diagnosis not present

## 2019-09-27 DIAGNOSIS — E782 Mixed hyperlipidemia: Secondary | ICD-10-CM | POA: Diagnosis not present

## 2019-09-27 DIAGNOSIS — I10 Essential (primary) hypertension: Secondary | ICD-10-CM | POA: Diagnosis not present

## 2019-09-27 DIAGNOSIS — M545 Low back pain: Secondary | ICD-10-CM | POA: Diagnosis not present

## 2019-09-27 DIAGNOSIS — F339 Major depressive disorder, recurrent, unspecified: Secondary | ICD-10-CM | POA: Diagnosis not present

## 2019-09-27 DIAGNOSIS — K219 Gastro-esophageal reflux disease without esophagitis: Secondary | ICD-10-CM | POA: Diagnosis not present

## 2019-10-02 ENCOUNTER — Other Ambulatory Visit: Payer: Self-pay

## 2019-10-02 ENCOUNTER — Ambulatory Visit (HOSPITAL_COMMUNITY): Admission: RE | Admit: 2019-10-02 | Payer: PPO | Source: Ambulatory Visit

## 2019-10-02 ENCOUNTER — Ambulatory Visit (HOSPITAL_COMMUNITY)
Admission: RE | Admit: 2019-10-02 | Discharge: 2019-10-02 | Disposition: A | Discharge status: Home / Self Care | Payer: PPO | Source: Ambulatory Visit | Attending: Unknown Physician Specialty | Admitting: Unknown Physician Specialty

## 2019-10-02 DIAGNOSIS — R928 Other abnormal and inconclusive findings on diagnostic imaging of breast: Secondary | ICD-10-CM | POA: Diagnosis not present

## 2019-10-02 DIAGNOSIS — R922 Inconclusive mammogram: Secondary | ICD-10-CM | POA: Diagnosis not present

## 2019-10-19 DIAGNOSIS — G894 Chronic pain syndrome: Secondary | ICD-10-CM | POA: Diagnosis not present

## 2019-10-19 DIAGNOSIS — Z79899 Other long term (current) drug therapy: Secondary | ICD-10-CM | POA: Diagnosis not present

## 2019-10-19 DIAGNOSIS — M792 Neuralgia and neuritis, unspecified: Secondary | ICD-10-CM | POA: Diagnosis not present

## 2019-10-22 DIAGNOSIS — I10 Essential (primary) hypertension: Secondary | ICD-10-CM | POA: Diagnosis not present

## 2019-10-22 DIAGNOSIS — K219 Gastro-esophageal reflux disease without esophagitis: Secondary | ICD-10-CM | POA: Diagnosis not present

## 2019-10-22 DIAGNOSIS — E7849 Other hyperlipidemia: Secondary | ICD-10-CM | POA: Diagnosis not present

## 2019-10-22 DIAGNOSIS — F339 Major depressive disorder, recurrent, unspecified: Secondary | ICD-10-CM | POA: Diagnosis not present

## 2019-10-22 DIAGNOSIS — E782 Mixed hyperlipidemia: Secondary | ICD-10-CM | POA: Diagnosis not present

## 2019-10-23 DIAGNOSIS — D3A029 Benign carcinoid tumor of the large intestine, unspecified portion: Secondary | ICD-10-CM | POA: Diagnosis not present

## 2019-10-29 DIAGNOSIS — G130 Paraneoplastic neuromyopathy and neuropathy: Secondary | ICD-10-CM | POA: Diagnosis not present

## 2019-10-30 ENCOUNTER — Ambulatory Visit: Payer: Self-pay | Admitting: Diagnostic Neuroimaging

## 2019-11-12 DIAGNOSIS — R7301 Impaired fasting glucose: Secondary | ICD-10-CM | POA: Diagnosis not present

## 2019-11-12 DIAGNOSIS — Z7689 Persons encountering health services in other specified circumstances: Secondary | ICD-10-CM | POA: Diagnosis not present

## 2019-11-12 DIAGNOSIS — G4733 Obstructive sleep apnea (adult) (pediatric): Secondary | ICD-10-CM | POA: Diagnosis not present

## 2019-11-12 DIAGNOSIS — R599 Enlarged lymph nodes, unspecified: Secondary | ICD-10-CM | POA: Diagnosis not present

## 2019-11-12 DIAGNOSIS — Z23 Encounter for immunization: Secondary | ICD-10-CM | POA: Diagnosis not present

## 2019-11-12 DIAGNOSIS — G629 Polyneuropathy, unspecified: Secondary | ICD-10-CM | POA: Diagnosis not present

## 2019-11-12 DIAGNOSIS — D509 Iron deficiency anemia, unspecified: Secondary | ICD-10-CM | POA: Diagnosis not present

## 2019-11-12 DIAGNOSIS — R1011 Right upper quadrant pain: Secondary | ICD-10-CM | POA: Diagnosis not present

## 2019-11-12 DIAGNOSIS — Z6828 Body mass index (BMI) 28.0-28.9, adult: Secondary | ICD-10-CM | POA: Diagnosis not present

## 2019-11-12 DIAGNOSIS — K219 Gastro-esophageal reflux disease without esophagitis: Secondary | ICD-10-CM | POA: Diagnosis not present

## 2019-11-12 DIAGNOSIS — F419 Anxiety disorder, unspecified: Secondary | ICD-10-CM | POA: Diagnosis not present

## 2019-11-12 DIAGNOSIS — C259 Malignant neoplasm of pancreas, unspecified: Secondary | ICD-10-CM | POA: Diagnosis not present

## 2019-11-12 DIAGNOSIS — F339 Major depressive disorder, recurrent, unspecified: Secondary | ICD-10-CM | POA: Diagnosis not present

## 2019-11-16 DIAGNOSIS — M5030 Other cervical disc degeneration, unspecified cervical region: Secondary | ICD-10-CM | POA: Diagnosis not present

## 2019-11-16 DIAGNOSIS — G894 Chronic pain syndrome: Secondary | ICD-10-CM | POA: Diagnosis not present

## 2019-11-16 DIAGNOSIS — Z79899 Other long term (current) drug therapy: Secondary | ICD-10-CM | POA: Diagnosis not present

## 2019-11-16 DIAGNOSIS — M5136 Other intervertebral disc degeneration, lumbar region: Secondary | ICD-10-CM | POA: Diagnosis not present

## 2019-11-20 ENCOUNTER — Telehealth: Payer: Self-pay | Admitting: Neurology

## 2019-11-20 NOTE — Telephone Encounter (Signed)
I called patient and LVM to reschedule 3/30 11:30 AM appointment to 1:30 that same day. 1:30 spot is on hold for patient. Requested patient call back to let us know.

## 2019-11-29 DIAGNOSIS — F339 Major depressive disorder, recurrent, unspecified: Secondary | ICD-10-CM | POA: Diagnosis not present

## 2019-11-29 DIAGNOSIS — K219 Gastro-esophageal reflux disease without esophagitis: Secondary | ICD-10-CM | POA: Diagnosis not present

## 2019-11-29 DIAGNOSIS — E7849 Other hyperlipidemia: Secondary | ICD-10-CM | POA: Diagnosis not present

## 2019-11-29 DIAGNOSIS — I10 Essential (primary) hypertension: Secondary | ICD-10-CM | POA: Diagnosis not present

## 2019-11-29 DIAGNOSIS — E782 Mixed hyperlipidemia: Secondary | ICD-10-CM | POA: Diagnosis not present

## 2019-12-11 ENCOUNTER — Other Ambulatory Visit: Payer: Self-pay

## 2019-12-11 ENCOUNTER — Ambulatory Visit: Payer: PPO | Admitting: Neurology

## 2019-12-11 ENCOUNTER — Encounter: Payer: Self-pay | Admitting: Neurology

## 2019-12-11 VITALS — BP 129/87 | HR 71 | Temp 97.7°F | Ht 65.0 in | Wt 190.5 lb

## 2019-12-11 DIAGNOSIS — D3A098 Benign carcinoid tumors of other sites: Secondary | ICD-10-CM

## 2019-12-11 DIAGNOSIS — G5603 Carpal tunnel syndrome, bilateral upper limbs: Secondary | ICD-10-CM | POA: Diagnosis not present

## 2019-12-11 DIAGNOSIS — R208 Other disturbances of skin sensation: Secondary | ICD-10-CM | POA: Insufficient documentation

## 2019-12-11 DIAGNOSIS — R260 Ataxic gait: Secondary | ICD-10-CM | POA: Insufficient documentation

## 2019-12-11 DIAGNOSIS — G629 Polyneuropathy, unspecified: Secondary | ICD-10-CM

## 2019-12-11 MED ORDER — LAMOTRIGINE 100 MG PO TABS: ORAL_TABLET | ORAL | 5 refills | Status: DC

## 2019-12-11 NOTE — Patient Instructions (Signed)
The pharmacy has the prescription for lamotrigine 100 mg tablets. For 5 days, just take one half pill a day. For the next 5 days, take one half pill twice a day. For the next 5 days, take one half pill 3 times a day Then start taking one pill twice a day from this point on.    In the future, we may increase the dose further.  If you get a rash, need to stop the medication and not take it again. 

## 2019-12-11 NOTE — Progress Notes (Signed)
[facility]  PATIENT: Pamela Hatfield DOB: July 13, 1951  REFERRING DOCTOR OR PCP:  Celene Squibb, MD SOURCE: patient, notes form PCP, imaging reports.    MRI images personally reviewed.    _________________________________   HISTORICAL  CHIEF COMPLAINT:  Chief Complaint  Patient presents with  . New Patient (Initial Visit)    RM 13, alone. Paper referral from [place] for pain in hands/feet and numbness. Has decreased sensation in hands and feet. Having difficutly sleeping at night due to this.     HISTORY OF PRESENT ILLNESS:  I had the pleasure of seeing your patient, Pamela Hatfield, at [facility] for neurologic consultation regarding her dysesthesias.  She is a 69 year old woman with burning in her feet and hands.  Pain is like a blistery burning sensation.    She has numbness in the same distribution and has noted loss of grip.   Walking is painful.     She notes reduced strength in her hands and feet.    She feels clumsy in hands and legs.    Pain is present 24/7 with fluctuations.  Turning around makes her fel more busy.      She was placed on gabapentin (400 mg  Qid) but it did not help much.   Pregabalin was too expensive.   She is also on duloxetine and tramadol 50 mg qid.        Symptoms started many years ago but worsened.    She was told in the past she had neuropathy.    She reports having a NCV/EMG study at [facility].   She recalls being told she had CTS but does not remember other aspects  She gets jerking in he legs and abdomen.  This happens randomly and has woken her up a few times at night.   Bladder function is fine.    She reports that gait is a little off balanced at time.   She also takes ropinirole 0.75 mg nightly for RLS.     She had ACDF C4-C7 11/16/2017.  I reviewed the images.   The pre-surgical MRI 10/12/2017 showed C4C5 greater than  C5C6 and C6C7 spinal stenosis and disc protrusion at C4C5.      She also has lower  back pain.   I reviewed her MRI images and she has mild spinal stenosis with lateral recess and foraminal narrowing at L3L4 and right lateral recess stenosis and milder foraminal narrowing at L4L5 that could affect the right L5 nerver root.    She has carcinoid near her aorta and in the intestines.   MRI cervical spine 10/12/2017:     1. Diffuse cervical spine spondylosis as described above. 2. At C4-5 there is a broad-based disc bulge with a small central disc protrusion contacting the ventral cervical spinal cord. Bilateral uncovertebral degenerative changes. Mild bilateral facet arthropathy. Moderate left foraminal stenosis.  MRI lumbar spine 10/12/2017:   Edema and enhancement of the inferior endplate of L3 and the superior endplate of L4 apparently secondary to acute Schmorl's nodes. This could certainly be associated with back pain. No evidence of fluid intensity material or enhancement within the disc, making disc space infection unlikely. Metastatic disease would not have this pattern.  L3-4 disc bulge and mild bilateral facet hypertrophy. Mild stenosis of both lateral recesses which could possibly be symptomatic.  L4-5 right hemilaminectomy.  No stenosis or neural compression.  L5-S1 asymmetric posterior elements, chronic. No stenosis or neural compression. REVIEW OF  SYSTEMS: Constitutional: No fevers, chills, sweats, or change in appetite Eyes: No visual changes, double vision, eye pain Ear, nose and throat: No hearing loss, ear pain, nasal congestion, sore throat Cardiovascular: No chest pain, palpitations Respiratory: No shortness of breath at rest or with exertion.   No wheezes GastrointestinaI: No nausea, vomiting, diarrhea, abdominal pain, fecal incontinence Genitourinary: No dysuria, urinary retention or frequency.  No nocturia. Musculoskeletal: No neck pain, back pain Integumentary: No rash, pruritus, skin lesions Neurological: as above Psychiatric: No  depression at this time.  No anxiety Endocrine: No palpitations, diaphoresis, change in appetite, change in weigh or increased thirst Hematologic/Lymphatic: No anemia, purpura, petechiae. Allergic/Immunologic: No itchy/runny eyes, nasal congestion, recent allergic reactions, rashes  ALLERGIES: Allergies  Allergen Reactions  . Sulfa Antibiotics Hives  . Clarithromycin Rash  . Codeine Itching and Other (See Comments)    Headaches  . Tape Itching    HOME MEDICATIONS:  Current Outpatient Medications:  .  cyclobenzaprine (FLEXERIL) 5 MG tablet, Take 5 mg by mouth 3 (three) times daily as needed for muscle spasms., Disp: , Rfl:  .  DULoxetine (CYMBALTA) 60 MG capsule, Take 60 mg by mouth daily., Disp: , Rfl:  .  enalapril (VASOTEC) 10 MG tablet, Take 10 mg by mouth daily. , Disp: , Rfl:  .  fluticasone (FLONASE) 50 MCG/ACT nasal spray, Place 1-2 sprays into both nostrils daily as needed for allergies or rhinitis., Disp: , Rfl:  .  gabapentin (NEURONTIN) 400 MG capsule, Take 400 mg by mouth 4 (four) times daily., Disp: , Rfl:  .  linaclotide (LINZESS) 72 MCG capsule, Take 72 mcg by mouth daily before breakfast., Disp: , Rfl:  .  Melatonin 10 MG TABS, Take 10 mg by mouth at bedtime., Disp: , Rfl:  .  metoprolol (LOPRESSOR) 50 MG tablet, Take 50 mg by mouth 2 (two) times daily., Disp: , Rfl:  .  montelukast (SINGULAIR) 10 MG tablet, Take 10 mg by mouth daily., Disp: , Rfl:  .  Multiple Vitamin (MULTIVITAMIN WITH MINERALS) TABS tablet, Take 1 tablet by mouth daily., Disp: , Rfl:  .  octreotide (SANDOSTATIN LAR) 10 MG injection, Inject 10 mg into the muscle every 28 (twenty-eight) days., Disp: , Rfl:  .  pantoprazole (PROTONIX) 40 MG tablet, Take 40 mg by mouth at bedtime. , Disp: , Rfl:  .  prochlorperazine (COMPAZINE) 5 MG tablet, Take 5 mg by mouth every 6 (six) hours as needed for nausea or vomiting., Disp: , Rfl:  .  rOPINIRole (REQUIP) 0.25 MG tablet, Take 0.25 mg by mouth 3 (three) times  daily., Disp: , Rfl:   PAST MEDICAL HISTORY: Past Medical History:  Diagnosis Date  . Accessory spleen   . Arthritis    multiple areas of her body  . Asthma   . Cancer ([place])    located on her aorta and small intestines- on Octreodtide 60m IM every 28 days for this  . Depression   . Diabetes mellitus    diet controlled  . Dyspnea    today 11/09/2017, reports that her breathing is normal for her   . Dysrhythmia    hx rapid heart beat  . Fibromyalgia   . GERD (gastroesophageal reflux disease)   . History of hiatal hernia   . Hyperlipidemia   . Hypertension   . Myalgia and myositis, unspecified   . Sleep apnea    no cpap used, pt does not like, use to use CPAP but due to insurance had to return the  device   . Weight loss 02/25/2011    PAST SURGICAL HISTORY: Past Surgical History:  Procedure Laterality Date  . ABDOMINAL HYSTERECTOMY  yrs ago   ovaries remain, done because of endometriosis  . ANTERIOR CERVICAL DECOMP/DISCECTOMY FUSION N/A 11/16/2017   Procedure: Anterior Cervical Decompression/Discectomy Fusion - Cervical four-Cervical five - Cervical five-Cervical six - Cervical six-Cervical seven;  Surgeon: Eustace Moore, MD;  Location: [facility];  Service: Neurosurgery;  Laterality: N/A;  . BACK SURGERY  yrs ago   lower back  . BREAST BIOPSY Bilateral    benign biopsies  . BREAST LUMPECTOMY     both breast  . CARDIAC CATHETERIZATION    . CARPAL TUNNEL RELEASE  2003  . CHOLECYSTECTOMY  yrs ago  . ESOPHAGEAL DILATION  06/14/2012   Procedure: ESOPHAGEAL DILATION;  Surgeon: Daneil Dolin, MD;  [facility];  Service: Endoscopy;;  . EUS  07/13/2012   Procedure: UPPER ENDOSCOPIC ULTRASOUND (EUS) LINEAR;  Surgeon: Milus Banister, MD;  Location: [facility];  Service: Endoscopy;  Laterality: N/A;  . NOSE SURGERY  yrs ago  . ROTATOR CUFF REPAIR  yrs ago   left    FAMILY HISTORY: Family History  Problem Relation Age of Onset  . Colon cancer Father   . Colon cancer  Maternal Aunt   . Colon cancer Maternal Uncle     SOCIAL HISTORY:  Social History   Socioeconomic History  . Marital status: Divorced    Spouse name: Not on file  . Number of children: 1  . Years of education: Some college  . Highest education level: Not on file  Occupational History  . Occupation: Retired  Tobacco Use  . Smoking status: Former Smoker    Packs/day: 0.50    Years: 20.00    Pack years: 10.00    Types: Cigarettes    Quit date: 09/13/1992    Years since quitting: 27.2  . Smokeless tobacco: Never Used  . Tobacco comment: quit about 25 + years ago  Substance and Sexual Activity  . Alcohol use: No  . Drug use: No  . Sexual activity: Not on file  Other Topics Concern  . Not on file  Social History Narrative   Lives alone   Caffeine use: none   Right handed    Social Determinants of Health   Financial Resource Strain:   . Difficulty of Paying Living Expenses:   Food Insecurity:   . Worried About Charity fundraiser in the Last Year:   . Arboriculturist in the Last Year:   Transportation Needs:   . Film/video editor (Medical):   Marland Kitchen Lack of Transportation (Non-Medical):   Physical Activity:   . Days of Exercise per Week:   . Minutes of Exercise per Session:   Stress:   . Feeling of Stress :   Social Connections:   . Frequency of Communication with Friends and Family:   . Frequency of Social Gatherings with Friends and Family:   . Attends Religious Services:   . Active Member of Clubs or Organizations:   . Attends Archivist Meetings:   Marland Kitchen Marital Status:   Intimate Partner Violence:   . Fear of Current or Ex-Partner:   . Emotionally Abused:   Marland Kitchen Physically Abused:   . Sexually Abused:      PHYSICAL EXAM  Vitals:   12/11/19 1319  BP: 129/87  Pulse: 71  Temp: 97.7 F (36.5 C)  Weight: 190 lb  8 oz (86.4 kg)  Height: 5' 5" (1.651 m)    Body mass index is 31.7 kg/m.   General: The patient is well-developed and  well-nourished and in no acute distress  HEENT:  Head is [place]/AT.  Sclera are anicteric.  Funduscopic exam shows normal optic discs and retinal vessels.  Neck: No carotid bruits are noted.  The neck is nontender.  Cardiovascular: The heart has a regular rate and rhythm with a normal S1 and S2. There were no murmurs, gallops or rubs.    Skin: Extremities are without rash or  edema.  Musculoskeletal:  Back is nontender  Neurologic Exam  Mental status: The patient is alert and oriented x 3 at the time of the examination. The patient has apparent normal recent and remote memory, with an apparently normal attention span and concentration ability.   Speech is normal.  Cranial nerves: Extraocular movements are full. Pupils are equal, round, and reactive to light and accomodation.  Visual fields are full.  Facial symmetry is present. There is good facial sensation to soft touch bilaterally.Facial strength is normal.  Trapezius and sternocleidomastoid strength is normal. No dysarthria is noted.  The tongue is midline, and the patient has symmetric elevation of the soft palate. No obvious hearing deficits are noted.  Motor:  Muscle bulk is normal.   Tone is normal. Strength is  5 / 5 except 4+/5 ulnar innervated left hand muscles, 4+ right EHL (4++ left)  Sensory: Sensory testing is intact to pinprick, soft touch and vibration sensation in the arms and proximal legs.  She had normal vibration sensation.  She reported reduced sensation to pinprick in the toes.  She has a  borderline Tinel's signs bilateral, no Phalen's  Coordination: Cerebellar testing reveals good finger-nose-finger and heel-to-shin bilaterally.  Gait and station: Station is normal.   Gait is normal. Tandem gait is normal. Romberg is negative.   Reflexes: Deep tendon reflexes are symmetric and normal bilaterally.   Plantar responses are flexor.     ASSESSMENT AND PLAN  Polyneuropathy - Plan: NCV with EMG(electromyography),  Multiple Myeloma Panel (SPEP&IFE w/QIG), Cryoglobulin, Hepatitis C antibody, Sjogren's syndrome antibods(ssa + ssb), Sedimentation rate, Paraneoplastic Profile 1  Dysesthesia - Plan: NCV with EMG(electromyography), Multiple Myeloma Panel (SPEP&IFE w/QIG), Cryoglobulin, Hepatitis C antibody, Sjogren's syndrome antibods(ssa + ssb), Sedimentation rate  Bilateral carpal tunnel syndrome  Ataxic gait  Carcinoid tumor of abdomen   In summary, Ms. Chapdelaine is a 69 year old woman with dysesthesias.  Symptoms are consistent with a small fiber polyneuropathy though she could also have multiple mononeuropathies (likely has carpal tunnel syndrome).  To investigate this further we will have her come back for a nerve conduction study and EMG.  Additionally, we will check blood work today.  My biggest concern is that this could be related to her carcinoid tumor.  To help with the dysesthetic sensations I have started lamotrigine and she will titrate up to a dose of 100 mg p.o. twice daily.  We discussed the titration schedule and to stop the medication if a rash develops.  She will return to see me for the NCV/EMG.  She should call sooner if she has significantly new or worsening neurologic symptoms.  Thank you for asking me to see Ms. Hiley.  Please let me know if I can be of further assistance with her or other patients in the future.   Richard A. Felecia Shelling, MD, [facility] 02/27/736, 1:06 PM Certified in Neurology, Clinical Neurophysiology, Sleep Medicine and Neuroimaging  [facility] [address], [place], [place] [address] [phone]

## 2019-12-15 LAB — MULTIPLE MYELOMA PANEL, SERUM
Albumin SerPl Elph-Mcnc: 3.6 g/dL (ref 2.9–4.4)
Albumin/Glob SerPl: 1 (ref 0.7–1.7)
Alpha 1: 0.2 g/dL (ref 0.0–0.4)
Alpha2 Glob SerPl Elph-Mcnc: 0.9 g/dL (ref 0.4–1.0)
B-Globulin SerPl Elph-Mcnc: 1.5 g/dL — ABNORMAL HIGH (ref 0.7–1.3)
Gamma Glob SerPl Elph-Mcnc: 1.2 g/dL (ref 0.4–1.8)
Globulin, Total: 3.8 g/dL (ref 2.2–3.9)
IgA/Immunoglobulin A, Serum: 482 mg/dL — ABNORMAL HIGH (ref 87–352)
IgG (Immunoglobin G), Serum: 1456 mg/dL (ref 586–1602)
IgM (Immunoglobulin M), Srm: 125 mg/dL (ref 26–217)
Total Protein: 7.4 g/dL (ref 6.0–8.5)

## 2019-12-15 LAB — CRYOGLOBULIN

## 2019-12-15 LAB — SJOGREN'S SYNDROME ANTIBODS(SSA + SSB)
ENA SSA (RO) Ab: <0.2 AI (ref 0.0–0.9)
ENA SSB (LA) Ab: <0.2 AI (ref 0.0–0.9)

## 2019-12-15 LAB — HEPATITIS C ANTIBODY: Hep C Virus Ab: <0.1 s/co ratio (ref 0.0–0.9)

## 2019-12-15 LAB — ANTI-HU, ANTI-RI, ANTI-YO IFA
Anti-Hu Ab: NEGATIVE
Anti-Ri Ab: NEGATIVE
Anti-Yo Ab: NEGATIVE

## 2019-12-15 LAB — SEDIMENTATION RATE: Sed Rate: 34 mm/hr (ref 0–40)

## 2019-12-18 DIAGNOSIS — M792 Neuralgia and neuritis, unspecified: Secondary | ICD-10-CM | POA: Diagnosis not present

## 2019-12-18 DIAGNOSIS — Z79899 Other long term (current) drug therapy: Secondary | ICD-10-CM | POA: Diagnosis not present

## 2019-12-18 DIAGNOSIS — M545 Low back pain: Secondary | ICD-10-CM | POA: Diagnosis not present

## 2019-12-18 DIAGNOSIS — G8929 Other chronic pain: Secondary | ICD-10-CM | POA: Diagnosis not present

## 2019-12-18 DIAGNOSIS — G894 Chronic pain syndrome: Secondary | ICD-10-CM | POA: Diagnosis not present

## 2019-12-28 DIAGNOSIS — I10 Essential (primary) hypertension: Secondary | ICD-10-CM | POA: Diagnosis not present

## 2019-12-28 DIAGNOSIS — E782 Mixed hyperlipidemia: Secondary | ICD-10-CM | POA: Diagnosis not present

## 2019-12-28 DIAGNOSIS — E7849 Other hyperlipidemia: Secondary | ICD-10-CM | POA: Diagnosis not present

## 2019-12-28 DIAGNOSIS — F339 Major depressive disorder, recurrent, unspecified: Secondary | ICD-10-CM | POA: Diagnosis not present

## 2019-12-28 DIAGNOSIS — K219 Gastro-esophageal reflux disease without esophagitis: Secondary | ICD-10-CM | POA: Diagnosis not present

## 2020-01-01 ENCOUNTER — Encounter: Payer: PPO | Admitting: Neurology

## 2020-01-01 DIAGNOSIS — G629 Polyneuropathy, unspecified: Secondary | ICD-10-CM | POA: Diagnosis not present

## 2020-01-01 DIAGNOSIS — K319 Disease of stomach and duodenum, unspecified: Secondary | ICD-10-CM | POA: Diagnosis not present

## 2020-01-01 DIAGNOSIS — K869 Disease of pancreas, unspecified: Secondary | ICD-10-CM | POA: Diagnosis not present

## 2020-01-01 DIAGNOSIS — D3A029 Benign carcinoid tumor of the large intestine, unspecified portion: Secondary | ICD-10-CM | POA: Diagnosis not present

## 2020-01-01 DIAGNOSIS — D72829 Elevated white blood cell count, unspecified: Secondary | ICD-10-CM | POA: Diagnosis not present

## 2020-01-01 DIAGNOSIS — R52 Pain, unspecified: Secondary | ICD-10-CM | POA: Diagnosis not present

## 2020-01-01 DIAGNOSIS — N281 Cyst of kidney, acquired: Secondary | ICD-10-CM | POA: Diagnosis not present

## 2020-01-01 DIAGNOSIS — D3A00 Benign carcinoid tumor of unspecified site: Secondary | ICD-10-CM | POA: Diagnosis not present

## 2020-01-01 DIAGNOSIS — R748 Abnormal levels of other serum enzymes: Secondary | ICD-10-CM | POA: Diagnosis not present

## 2020-01-15 ENCOUNTER — Encounter: Payer: PPO | Admitting: Neurology

## 2020-01-15 ENCOUNTER — Ambulatory Visit (INDEPENDENT_AMBULATORY_CARE_PROVIDER_SITE_OTHER): Payer: PPO | Admitting: Neurology

## 2020-01-15 ENCOUNTER — Other Ambulatory Visit: Payer: Self-pay

## 2020-01-15 DIAGNOSIS — G629 Polyneuropathy, unspecified: Secondary | ICD-10-CM | POA: Diagnosis not present

## 2020-01-15 DIAGNOSIS — R208 Other disturbances of skin sensation: Secondary | ICD-10-CM

## 2020-01-15 DIAGNOSIS — Z0289 Encounter for other administrative examinations: Secondary | ICD-10-CM

## 2020-01-15 DIAGNOSIS — G5603 Carpal tunnel syndrome, bilateral upper limbs: Secondary | ICD-10-CM | POA: Diagnosis not present

## 2020-01-15 NOTE — Progress Notes (Signed)
Full Name: Pamela Hatfield Gender: Female MRN #: [id number] Date of Birth: [birth year]    Visit Date: 01/15/2020 08:47 Age: 69 Years Examining Physician: Arlice Colt, MD  Referring Physician: Arlice Colt, MD Height: 5 feet 5 inch    History: Pamela Hatfield is a 69 year old woman with numbness in the legs and left hand.  Numbness in the legs goes up to mid shin.  On exam, she had reduced pinprick sensation to mid shin and reduced vibration sensation only in the toes.  She had 4/5 strength in the left APB muscle and 4+/5 strength in the EHL muscles of the feet.  Nerve conduction studies: The left median motor response had a delayed distal latency with reduced amplitude.  Forearm conduction was normal.  The right median, right ulnar, left ulnar, right peroneal and right tibial motor responses had normal distal latencies, amplitudes and conduction velocities.  Bilateral ulnar F-wave latencies were normal.  Right tibial F-wave latency was borderline prolonged.  Orthodromic left median sensory response was absent and antidromic left median palmar sensory response was markedly reduced in amplitude and markedly delayed in latency.  Right median sensory response was slightly delayed across the wrist with a reduced amplitude.  Bilateral ulnar and right sural sensory responses were normal.  The right superficial peroneal sensory response had a normal latency but reduced amplitude.  The sympathetic skin response in the foot was absent.  Electromyography: Needle EMG of selected muscles of right leg and left arm was performed.  The vastus medialis, tibialis anterior, peroneus longus, gastrocnemius, gluteus medius and iliopsoas muscles were essentially normal.  The right abductor hallucis muscle showed moderate chronic denervation in the right EDB muscle had mild chronic denervation.  Needle EMG of selected muscles of the left arm showed mild chronic denervation in the left abductor pollicis brevis  muscle.  Other muscles tested were normal.  Impression: This NCV/EMG study shows the following: 1.  Severe median neuropathy across the left wrist (severe carpal tunnel syndrome). 2.  Minimal median neuropathy across the right wrist 3.  Length dependent axonal polyneuropathy affecting small fibers and large fibers.   Pamela Hatfield A. Pamela Shelling, MD, PhD, FAAN Certified in Neurology, Clinical Neurophysiology, Sleep Medicine, Pain Medicine and Neuroimaging Director, [place] [facility] [address] [place], [place] [address] [phone]  Clinical note: She will be referred for carpal tunnel release on the left.  -- RAS    Verbal informed consent was obtained from the patient, patient was informed of potential risk of procedure, including bruising, bleeding, hematoma formation, infection, muscle weakness, muscle pain, numbness, among others.         [place]    Nerve / Sites Muscle Latency Ref. Amplitude Ref. Rel Amp Segments Distance Velocity Ref. Area    ms ms mV mV %  cm m/s m/s mVms  L Median - APB     Wrist APB 8.9 ?4.4 0.4 ?4.0 100 Wrist - APB 7   0.9     Upper arm APB 11.9  0.3  80 Upper arm - Wrist 20 67 ?49 0.6     Ulnar Wrist APB 2.9  7.7  2690 Ulnar Wrist - Upper arm    21.0     Ulnar B. Elbow APB 6.4  5.2  66.9 Ulnar B. Elbow - Ulnar Wrist    13.8     Ulnar A. Elbow APB 7.8  7.2  140 Ulnar A. Elbow -  Ulnar B. Elbow    18.8  R Median - APB     Wrist APB 3.9 ?4.4 4.8 ?4.0 100 Wrist - APB 7   17.4     Upper arm APB 8.0  4.2  87.6 Upper arm - Wrist 20 49 ?49 14.4  L Ulnar - ADM     Wrist ADM 2.8 ?3.3 7.9 ?6.0 100 Wrist - ADM 7   28.3     B.Elbow ADM 5.8  7.5  94.7 B.Elbow - Wrist 15 50 ?49 27.2     A.Elbow ADM 7.3  8.1  109 A.Elbow - B.Elbow 10 69 ?49 31.0         A.Elbow - Wrist      R Ulnar - ADM     Wrist ADM 2.5 ?3.3 9.9 ?6.0 100 Wrist - ADM 7   28.6     B.Elbow ADM 5.4  9.2  92.4 B.Elbow - Wrist  16 55 ?49 26.9     A.Elbow ADM 7.0  8.9  96.9 A.Elbow - B.Elbow 10 63 ?49 27.4         A.Elbow - Wrist      R Peroneal - EDB     Ankle EDB 5.1 ?6.5 3.7 ?2.0 100 Ankle - EDB 9   13.6     Fib head EDB 11.1  3.4  90.2 Fib head - Ankle 27 45 ?44 13.1     Pop fossa EDB 13.2  3.5  104 Pop fossa - Fib head 10 46 ?44 13.1         Pop fossa - Ankle      R Tibial - AH     Ankle AH 4.3 ?5.8 10.9 ?4.0 100 Ankle - AH 9   20.0     Pop fossa AH 12.9  6.9  63.2 Pop fossa - Ankle 35 41 ?41 16.6                 SSR    Nerve / Sites Latency   s  R Sympathetic - Foot     Foot NR       SNC    Nerve / Sites Rec. Site Peak Lat Ref.  Amp Ref. Segments Distance Peak Diff Ref.    ms ms V V  cm ms ms  R Sural - Ankle (Calf)     Calf Ankle 4.0 ?4.4 9 ?6 Calf - Ankle 14    R Superficial peroneal - Ankle     Lat leg Ankle 3.8 ?4.4 3 ?6 Lat leg - Ankle 14    L Median, Ulnar - Transcarpal comparison     Median Palm Wrist 4.4 ?2.2 4 ?35 Median Palm - Wrist 8       Ulnar Palm Wrist 1.9 ?2.2 18 ?12 Ulnar Palm - Wrist 8          Median Palm - Ulnar Palm  2.6 ?0.4  R Median, Ulnar - Transcarpal comparison     Median Palm Wrist 2.3 ?2.2 58 ?35 Median Palm - Wrist 8       Ulnar Palm Wrist 1.9 ?2.2 11 ?12 Ulnar Palm - Wrist 8          Median Palm - Ulnar Palm  0.4 ?0.4  L Median - Orthodromic (Dig II, Mid palm)     Dig II Wrist NR ?3.4 NR ?10 Dig II - Wrist 13    R Median - Orthodromic (Dig II, Mid palm)     Dig  II Wrist 3.5 ?3.4 6 ?10 Dig II - Wrist 13    L Ulnar - Orthodromic, (Dig V, Mid palm)     Dig V Wrist 2.8 ?3.1 6 ?5 Dig V - Wrist 11    R Ulnar - Orthodromic, (Dig V, Mid palm)     Dig V Wrist 3.0 ?3.1 7 ?5 Dig V - Wrist 93                       F  Wave    Nerve F Lat Ref.   ms ms  L Ulnar - ADM 28.8 ?32.0  R Ulnar - ADM 28.9 ?32.0  R Tibial - AH 59.9 ?56.0           EMG Summary Table    Spontaneous MUAP Recruitment  Muscle IA Fib PSW Fasc Other Amp Dur. Poly Pattern  R. Vastus medialis  Normal None None None _______ Normal Normal Normal Normal  R. Tibialis anterior Normal None None None _______ Normal Normal Normal Normal  R. Peroneus longus Normal None None None _______ Normal Normal 1+ Normal  R. Gastrocnemius (Medial head) Normal None None None _______ Normal Normal Normal Normal  R. Abductor hallucis Normal None None None _______ Normal Increased 2+ Discrete  R. Extensor digitorum brevis Normal None None None _______ Increased Increased 1+ Reduced  R. Gluteus medius Normal None None None _______ Normal Normal Normal Normal  L. First dorsal interosseous Normal None None None _______ Normal Normal Normal Normal  L. Abductor pollicis brevis Normal None None None _______ Normal Normal 1+ Reduced  L. Extensor digitorum communis Normal None None None _______ Normal Normal Normal Normal  L. Deltoid Normal None None None _______ Normal Normal Normal Normal  L. Triceps brachii Normal None None None _______ Normal Normal Normal Normal  L. Biceps brachii Normal None None None _______ Normal Normal Normal Normal

## 2020-01-16 DIAGNOSIS — N2889 Other specified disorders of kidney and ureter: Secondary | ICD-10-CM | POA: Diagnosis not present

## 2020-01-16 DIAGNOSIS — N281 Cyst of kidney, acquired: Secondary | ICD-10-CM | POA: Diagnosis not present

## 2020-01-16 DIAGNOSIS — Q613 Polycystic kidney, unspecified: Secondary | ICD-10-CM | POA: Diagnosis not present

## 2020-01-17 DIAGNOSIS — G894 Chronic pain syndrome: Secondary | ICD-10-CM | POA: Diagnosis not present

## 2020-01-17 DIAGNOSIS — Z79899 Other long term (current) drug therapy: Secondary | ICD-10-CM | POA: Diagnosis not present

## 2020-01-17 DIAGNOSIS — M5136 Other intervertebral disc degeneration, lumbar region: Secondary | ICD-10-CM | POA: Diagnosis not present

## 2020-01-23 ENCOUNTER — Telehealth: Payer: Self-pay | Admitting: Neurology

## 2020-01-23 MED ORDER — OXCARBAZEPINE 300 MG PO TABS: 300.0000 mg | ORAL_TABLET | Freq: Two times a day (BID) | ORAL | 5 refills | Status: DC

## 2020-01-23 NOTE — Telephone Encounter (Signed)
We can try oxcarbazepine 300 mg po bid   #60  #5.   If well tolerated we can increase it.  She should stop lamotrigine

## 2020-01-23 NOTE — Telephone Encounter (Signed)
Pt has called to report that she has developed a rash while taking the lamoTRIgine (LAMICTAL) 100 MG tablet.  Please call

## 2020-01-23 NOTE — Telephone Encounter (Signed)
Dr. Felecia Shelling- is there an alternative medication you would recommend?

## 2020-01-23 NOTE — Telephone Encounter (Signed)
Called pt. Relayed Dr. Garth Bigness recommendation. She is agreeable to this change. She will stop lamotrigine. E-scribed rx oxcarbazepine to [facility] and asked that they d/c lamotrigine. Added lamotrigine to allergy list for pt.

## 2020-01-23 NOTE — Addendum Note (Signed)
Addended by: Wyvonnia Lora on: 01/23/2020 01:25 PM   Modules accepted: Orders

## 2020-01-29 ENCOUNTER — Other Ambulatory Visit: Payer: Self-pay

## 2020-01-29 ENCOUNTER — Encounter (HOSPITAL_COMMUNITY): Payer: Self-pay | Admitting: Emergency Medicine

## 2020-01-29 ENCOUNTER — Observation Stay (HOSPITAL_COMMUNITY)
Admission: EM | Admit: 2020-01-29 | Discharge: 2020-01-30 | Disposition: A | Discharge status: Home / Self Care | Payer: PPO | Attending: Internal Medicine | Admitting: Internal Medicine

## 2020-01-29 DIAGNOSIS — Z20822 Contact with and (suspected) exposure to covid-19: Secondary | ICD-10-CM | POA: Insufficient documentation

## 2020-01-29 DIAGNOSIS — E782 Mixed hyperlipidemia: Secondary | ICD-10-CM | POA: Insufficient documentation

## 2020-01-29 DIAGNOSIS — E1142 Type 2 diabetes mellitus with diabetic polyneuropathy: Secondary | ICD-10-CM | POA: Diagnosis not present

## 2020-01-29 DIAGNOSIS — I10 Essential (primary) hypertension: Secondary | ICD-10-CM | POA: Insufficient documentation

## 2020-01-29 DIAGNOSIS — Z7984 Long term (current) use of oral hypoglycemic drugs: Secondary | ICD-10-CM | POA: Diagnosis not present

## 2020-01-29 DIAGNOSIS — N281 Cyst of kidney, acquired: Secondary | ICD-10-CM | POA: Diagnosis not present

## 2020-01-29 DIAGNOSIS — J301 Allergic rhinitis due to pollen: Secondary | ICD-10-CM | POA: Insufficient documentation

## 2020-01-29 DIAGNOSIS — J302 Other seasonal allergic rhinitis: Secondary | ICD-10-CM | POA: Diagnosis present

## 2020-01-29 DIAGNOSIS — Z888 Allergy status to other drugs, medicaments and biological substances status: Secondary | ICD-10-CM | POA: Diagnosis not present

## 2020-01-29 DIAGNOSIS — Z79899 Other long term (current) drug therapy: Secondary | ICD-10-CM | POA: Insufficient documentation

## 2020-01-29 DIAGNOSIS — E871 Hypo-osmolality and hyponatremia: Principal | ICD-10-CM | POA: Insufficient documentation

## 2020-01-29 DIAGNOSIS — G629 Polyneuropathy, unspecified: Secondary | ICD-10-CM | POA: Diagnosis not present

## 2020-01-29 DIAGNOSIS — R5383 Other fatigue: Secondary | ICD-10-CM | POA: Diagnosis not present

## 2020-01-29 DIAGNOSIS — K5900 Constipation, unspecified: Secondary | ICD-10-CM | POA: Diagnosis not present

## 2020-01-29 DIAGNOSIS — K219 Gastro-esophageal reflux disease without esophagitis: Secondary | ICD-10-CM | POA: Diagnosis not present

## 2020-01-29 DIAGNOSIS — C7A092 Malignant carcinoid tumor of the stomach: Secondary | ICD-10-CM | POA: Diagnosis not present

## 2020-01-29 DIAGNOSIS — E119 Type 2 diabetes mellitus without complications: Secondary | ICD-10-CM | POA: Insufficient documentation

## 2020-01-29 DIAGNOSIS — M797 Fibromyalgia: Secondary | ICD-10-CM | POA: Insufficient documentation

## 2020-01-29 DIAGNOSIS — D3A098 Benign carcinoid tumors of other sites: Secondary | ICD-10-CM | POA: Diagnosis present

## 2020-01-29 DIAGNOSIS — R748 Abnormal levels of other serum enzymes: Secondary | ICD-10-CM | POA: Diagnosis not present

## 2020-01-29 DIAGNOSIS — D3A029 Benign carcinoid tumor of the large intestine, unspecified portion: Secondary | ICD-10-CM | POA: Diagnosis not present

## 2020-01-29 DIAGNOSIS — Z87891 Personal history of nicotine dependence: Secondary | ICD-10-CM | POA: Insufficient documentation

## 2020-01-29 DIAGNOSIS — U071 COVID-19: Secondary | ICD-10-CM | POA: Diagnosis not present

## 2020-01-29 LAB — URINALYSIS, ROUTINE W REFLEX MICROSCOPIC
Bilirubin Urine: NEGATIVE
Glucose, UA: NEGATIVE mg/dL
Ketones, ur: NEGATIVE mg/dL
Leukocytes,Ua: NEGATIVE
Nitrite: NEGATIVE
Protein, ur: NEGATIVE mg/dL
Specific Gravity, Urine: 1.013 (ref 1.005–1.030)
pH: 5 (ref 5.0–8.0)

## 2020-01-29 LAB — CBC WITH DIFFERENTIAL/PLATELET
Abs Immature Granulocytes: 0.12 10*3/uL — ABNORMAL HIGH (ref 0.00–0.07)
Basophils Absolute: 0.1 10*3/uL (ref 0.0–0.1)
Basophils Relative: 1 %
Eosinophils Absolute: 0.4 10*3/uL (ref 0.0–0.5)
Eosinophils Relative: 4 %
HCT: 39.3 % (ref 36.0–46.0)
Hemoglobin: 12.2 g/dL (ref 12.0–15.0)
Immature Granulocytes: 1 %
Lymphocytes Relative: 21 %
Lymphs Abs: 2.1 10*3/uL (ref 0.7–4.0)
MCH: 28.2 pg (ref 26.0–34.0)
MCHC: 31 g/dL (ref 30.0–36.0)
MCV: 91 fL (ref 80.0–100.0)
Monocytes Absolute: 0.9 10*3/uL (ref 0.1–1.0)
Monocytes Relative: 9 %
Neutro Abs: 6.7 10*3/uL (ref 1.7–7.7)
Neutrophils Relative %: 64 %
Platelets: 409 10*3/uL — ABNORMAL HIGH (ref 150–400)
RBC: 4.32 MIL/uL (ref 3.87–5.11)
RDW: 13 % (ref 11.5–15.5)
WBC: 10.2 10*3/uL (ref 4.0–10.5)
nRBC: 0 % (ref 0.0–0.2)

## 2020-01-29 LAB — BASIC METABOLIC PANEL
Anion gap: 11 (ref 5–15)
BUN: 12 mg/dL (ref 8–23)
CO2: 24 mmol/L (ref 22–32)
Calcium: 8.3 mg/dL — ABNORMAL LOW (ref 8.9–10.3)
Chloride: 92 mmol/L — ABNORMAL LOW (ref 98–111)
Creatinine, Ser: 0.9 mg/dL (ref 0.44–1.00)
GFR calc Af Amer: >60 mL/min (ref >60)
GFR calc non Af Amer: >60 mL/min (ref >60)
Glucose, Bld: 140 mg/dL — ABNORMAL HIGH (ref 70–99)
Potassium: 4 mmol/L (ref 3.5–5.1)
Sodium: 127 mmol/L — ABNORMAL LOW (ref 135–145)

## 2020-01-29 LAB — HEPATIC FUNCTION PANEL
ALT: 20 U/L (ref 0–44)
AST: 24 U/L (ref 15–41)
Albumin: 3.5 g/dL (ref 3.5–5.0)
Alkaline Phosphatase: 157 U/L — ABNORMAL HIGH (ref 38–126)
Bilirubin, Direct: <0.1 mg/dL (ref 0.0–0.2)
Total Bilirubin: 0.2 mg/dL — ABNORMAL LOW (ref 0.3–1.2)
Total Protein: 7.7 g/dL (ref 6.5–8.1)

## 2020-01-29 LAB — PHOSPHORUS: Phosphorus: 3.9 mg/dL (ref 2.5–4.6)

## 2020-01-29 LAB — MAGNESIUM: Magnesium: 1.9 mg/dL (ref 1.7–2.4)

## 2020-01-29 LAB — SARS CORONAVIRUS 2 BY RT PCR (HOSPITAL ORDER, PERFORMED IN ~~LOC~~ HOSPITAL LAB): SARS Coronavirus 2: NEGATIVE

## 2020-01-29 MED ORDER — ACETAMINOPHEN 650 MG RE SUPP: 650.0000 mg | Freq: Four times a day (QID) | RECTAL | Status: DC | PRN

## 2020-01-29 MED ORDER — METOPROLOL TARTRATE 50 MG PO TABS
50.0000 mg | ORAL_TABLET | Freq: Two times a day (BID) | ORAL | Status: DC
  Administered 2020-01-29 – 2020-01-30 (×2): 50 mg via ORAL
  Filled 2020-01-29 (×2): qty 1

## 2020-01-29 MED ORDER — MONTELUKAST SODIUM 10 MG PO TABS
10.0000 mg | ORAL_TABLET | Freq: Every day | ORAL | Status: DC
  Administered 2020-01-29: 10 mg via ORAL
  Filled 2020-01-29: qty 1

## 2020-01-29 MED ORDER — ROPINIROLE HCL 0.25 MG PO TABS
0.7500 mg | ORAL_TABLET | Freq: Three times a day (TID) | ORAL | Status: DC
  Administered 2020-01-29: 0.75 mg via ORAL
  Filled 2020-01-29 (×2): qty 3

## 2020-01-29 MED ORDER — ONDANSETRON HCL 4 MG PO TABS: 4.0000 mg | ORAL_TABLET | Freq: Four times a day (QID) | ORAL | Status: DC | PRN

## 2020-01-29 MED ORDER — ENOXAPARIN SODIUM 40 MG/0.4ML ~~LOC~~ SOLN
40.0000 mg | SUBCUTANEOUS | Status: DC
  Administered 2020-01-29: 40 mg via SUBCUTANEOUS
  Filled 2020-01-29: qty 0.4

## 2020-01-29 MED ORDER — SODIUM CHLORIDE 0.9 % IV SOLN: INTRAVENOUS | Status: DC

## 2020-01-29 MED ORDER — ACETAMINOPHEN 325 MG PO TABS: 650.0000 mg | ORAL_TABLET | Freq: Four times a day (QID) | ORAL | Status: DC | PRN

## 2020-01-29 MED ORDER — CYCLOBENZAPRINE HCL 10 MG PO TABS
10.0000 mg | ORAL_TABLET | Freq: Three times a day (TID) | ORAL | Status: DC
  Administered 2020-01-29 – 2020-01-30 (×2): 10 mg via ORAL
  Filled 2020-01-29 (×2): qty 1

## 2020-01-29 MED ORDER — ENALAPRIL MALEATE 5 MG PO TABS
10.0000 mg | ORAL_TABLET | Freq: Every day | ORAL | Status: DC
  Administered 2020-01-30: 10 mg via ORAL
  Filled 2020-01-29: qty 2

## 2020-01-29 MED ORDER — TRAMADOL HCL 50 MG PO TABS
50.0000 mg | ORAL_TABLET | Freq: Four times a day (QID) | ORAL | Status: DC | PRN
  Administered 2020-01-30: 50 mg via ORAL
  Filled 2020-01-29: qty 1

## 2020-01-29 MED ORDER — ONDANSETRON HCL 4 MG/2ML IJ SOLN: 4.0000 mg | Freq: Four times a day (QID) | INTRAMUSCULAR | Status: DC | PRN

## 2020-01-29 MED ORDER — LORATADINE 10 MG PO TABS
10.0000 mg | ORAL_TABLET | Freq: Every day | ORAL | Status: DC
  Administered 2020-01-30: 10 mg via ORAL
  Filled 2020-01-29: qty 1

## 2020-01-29 MED ORDER — LINACLOTIDE 72 MCG PO CAPS
72.0000 ug | ORAL_CAPSULE | Freq: Every day | ORAL | Status: DC
  Administered 2020-01-30: 72 ug via ORAL
  Filled 2020-01-29 (×3): qty 1

## 2020-01-29 MED ORDER — POLYETHYLENE GLYCOL 3350 17 G PO PACK: 17.0000 g | PACK | Freq: Every day | ORAL | Status: DC | PRN

## 2020-01-29 MED ORDER — SODIUM CHLORIDE 0.9 % IV SOLN: Freq: Once | INTRAVENOUS | Status: AC

## 2020-01-29 MED ORDER — DULOXETINE HCL 60 MG PO CPEP
60.0000 mg | ORAL_CAPSULE | Freq: Every day | ORAL | Status: DC
  Administered 2020-01-30: 60 mg via ORAL
  Filled 2020-01-29: qty 1

## 2020-01-29 MED ORDER — GABAPENTIN 400 MG PO CAPS
400.0000 mg | ORAL_CAPSULE | Freq: Four times a day (QID) | ORAL | Status: DC
  Administered 2020-01-29 – 2020-01-30 (×2): 400 mg via ORAL
  Filled 2020-01-29 (×2): qty 1

## 2020-01-29 MED ORDER — PANTOPRAZOLE SODIUM 40 MG PO TBEC
40.0000 mg | DELAYED_RELEASE_TABLET | Freq: Every day | ORAL | Status: DC
  Administered 2020-01-29: 40 mg via ORAL
  Filled 2020-01-29: qty 1

## 2020-01-29 MED ORDER — PROCHLORPERAZINE MALEATE 5 MG PO TABS: 5.0000 mg | ORAL_TABLET | Freq: Four times a day (QID) | ORAL | Status: DC | PRN

## 2020-01-29 MED ORDER — SODIUM CHLORIDE 1 G PO TABS
2.0000 g | ORAL_TABLET | Freq: Once | ORAL | Status: AC
  Administered 2020-01-29: 2 g via ORAL
  Filled 2020-01-29 (×2): qty 2

## 2020-01-29 MED ORDER — MELATONIN 3 MG PO TABS
9.0000 mg | ORAL_TABLET | Freq: Every day | ORAL | Status: DC
  Administered 2020-01-29: 9 mg via ORAL
  Filled 2020-01-29: qty 3

## 2020-01-29 NOTE — ED Triage Notes (Signed)
Pt reports she went to see her [facility] and was told to come to the ED due to sodium level of 124

## 2020-01-29 NOTE — ED Provider Notes (Signed)
Emergency Department Provider Note   I have reviewed the triage vital signs and the nursing notes.   HISTORY  Chief Complaint Abnormal Lab   HPI CENEDRA SPERLE is a 69 y.o. female with past medical history reviewed below including carcinoid tumor followed at [facility] in [place] presents with fatigue over the past 3 to 4 days and low sodium on routine lab work today.  Patient had follow-up blood work with her oncology group and was called when the results came back showing sodium of 124.  The patient has been feeling very fatigued over the past 3 to 4 days.  She was started on oxcarbazepine 1 week ago by her neurologist which is thought to be the cause of her hyponatremia.  She is otherwise been eating and drinking well.  She has no history of CHF.  No diarrhea or vomiting. No radiation of symptoms or modifying factors.   Past Medical History:  Diagnosis Date  . Accessory spleen   . Arthritis    multiple areas of her body  . Asthma   . Cancer ([place])    located on her aorta and small intestines- on Octreodtide 10mg  IM every 28 days for this  . Depression   . Diabetes mellitus    diet controlled  . Dyspnea    today 11/09/2017, reports that her breathing is normal for her   . Dysrhythmia    hx rapid heart beat  . Fibromyalgia   . GERD (gastroesophageal reflux disease)   . History of hiatal hernia   . Hyperlipidemia   . Hypertension   . Myalgia and myositis, unspecified   . Sleep apnea    no cpap used, pt does not like, use to use CPAP but due to insurance had to return the device   . Weight loss 02/25/2011    Patient Active Problem List   Diagnosis Date Noted  . Hyponatremia 01/29/2020  . Type 2 diabetes mellitus ([place]) 01/29/2020  . Fibromyalgia   . Seasonal allergies   . Ataxic gait 12/11/2019  . Bilateral carpal tunnel syndrome 12/11/2019  . Dysesthesia 12/11/2019  . Polyneuropathy 12/11/2019  . Cervical vertebral fusion 11/16/2017  . Essential  hypertension 05/05/2016  . Chest pain 05/05/2016  . Palpitations 05/05/2016  . Carcinoid tumor of abdomen 04/29/2013    Class: Acute  . Malignant carcinoid tumor of other sites ([place]) 04/29/2013  . Mixed hyperlipidemia 03/14/2013  . Myalgia and myositis, unspecified 03/14/2013  . Nonspecific (abnormal) findings on radiological and other examination of gastrointestinal tract 07/13/2012  . Family history of colon cancer 05/23/2012  . Weight loss 05/23/2012  . Abdominal pain 05/23/2012  . GERD (gastroesophageal reflux disease) 05/23/2012  . Dysphagia 05/23/2012    Past Surgical History:  Procedure Laterality Date  . ABDOMINAL HYSTERECTOMY  yrs ago   ovaries remain, done because of endometriosis  . ANTERIOR CERVICAL DECOMP/DISCECTOMY FUSION N/A 11/16/2017   Procedure: Anterior Cervical Decompression/Discectomy Fusion - Cervical four-Cervical five - Cervical five-Cervical six - Cervical six-Cervical seven;  Surgeon: Eustace Moore, MD;  Location: [facility];  Service: Neurosurgery;  Laterality: N/A;  . BACK SURGERY  yrs ago   lower back  . BREAST BIOPSY Bilateral    benign biopsies  . BREAST LUMPECTOMY     both breast  . CARDIAC CATHETERIZATION    . CARPAL TUNNEL RELEASE  2003  . CHOLECYSTECTOMY  yrs ago  . ESOPHAGEAL DILATION  06/14/2012   Procedure: ESOPHAGEAL DILATION;  Surgeon: Daneil Dolin,  MD;  [facility];  Service: Endoscopy;;  . EUS  07/13/2012   Procedure: UPPER ENDOSCOPIC ULTRASOUND (EUS) LINEAR;  Surgeon: Milus Banister, MD;  [facility];  Service: Endoscopy;  Laterality: N/A;  . NOSE SURGERY  yrs ago  . ROTATOR CUFF REPAIR  yrs ago   left    Allergies Nickel, Sulfa antibiotics, Clarithromycin, Codeine, Lamotrigine, and Tape  Family History  Problem Relation Age of Onset  . Colon cancer Father   . Colon cancer Maternal Aunt   . Colon cancer Maternal Uncle     Social History Social History   Tobacco Use  . Smoking status: Former Smoker     Packs/day: 0.50    Years: 20.00    Pack years: 10.00    Types: Cigarettes    Quit date: 09/13/1992    Years since quitting: 27.3  . Smokeless tobacco: Never Used  . Tobacco comment: quit about 25 + years ago  Substance Use Topics  . Alcohol use: No  . Drug use: No    Review of Systems  Constitutional: No fever/chills. Positive fatigue and generalized weakness.  Eyes: No visual changes. ENT: No sore throat. Cardiovascular: Denies chest pain. Respiratory: Denies shortness of breath. Gastrointestinal: No abdominal pain.  No nausea, no vomiting.  No diarrhea.  No constipation. Genitourinary: Negative for dysuria. Musculoskeletal: Negative for back pain. Skin: Negative for rash. Neurological: Negative for headaches, focal weakness or numbness.  10-point ROS otherwise negative.  ____________________________________________   PHYSICAL EXAM:  VITAL SIGNS: ED Triage Vitals  Enc Vitals Group     BP 01/29/20 1759 (!) 150/68     Pulse Rate 01/29/20 1759 79     Resp 01/29/20 1759 17     Temp 01/29/20 1759 98.6 F (37 C)     Temp Source 01/29/20 1759 Oral     SpO2 01/29/20 1759 99 %     Weight 01/29/20 1801 195 lb (88.5 kg)     Height 01/29/20 1801 5\' 5"  (1.651 m)   Constitutional: Alert and oriented. Well appearing and in no acute distress. Eyes: Conjunctivae are normal. Head: Atraumatic. Nose: No congestion/rhinnorhea. Mouth/Throat: Mucous membranes are moist. Neck: No stridor.  Cardiovascular: Normal rate, regular rhythm. Good peripheral circulation. Grossly normal heart sounds.   Respiratory: Normal respiratory effort.  No retractions. Lungs CTAB. Gastrointestinal: Soft and nontender. No distention.  Musculoskeletal: No gross deformities of extremities. Neurologic:  Normal speech and language.  Equal strength and sensation in bilateral upper and lower extremities.  No facial asymmetry. Skin:  Skin is warm, dry and intact. No rash  noted.  ____________________________________________   LABS (all labs ordered are listed, but only abnormal results are displayed)  Labs Reviewed  BASIC METABOLIC PANEL - Abnormal; Notable for the following components:      Result Value   Sodium 127 (*)    Chloride 92 (*)    Glucose, Bld 140 (*)    Calcium 8.3 (*)    All other components within normal limits  CBC WITH DIFFERENTIAL/PLATELET - Abnormal; Notable for the following components:   Platelets 409 (*)    Abs Immature Granulocytes 0.12 (*)    All other components within normal limits  URINALYSIS, ROUTINE W REFLEX MICROSCOPIC - Abnormal; Notable for the following components:   Hgb urine dipstick MODERATE (*)    Bacteria, UA RARE (*)    All other components within normal limits  HEPATIC FUNCTION PANEL - Abnormal; Notable for the following components:   Alkaline Phosphatase  157 (*)    Total Bilirubin 0.2 (*)    All other components within normal limits  RENAL FUNCTION PANEL - Abnormal; Notable for the following components:   Sodium 131 (*)    Glucose, Bld 139 (*)    Calcium 8.8 (*)    Albumin 3.2 (*)    All other components within normal limits  GLUCOSE, CAPILLARY - Abnormal; Notable for the following components:   Glucose-Capillary 137 (*)    All other components within normal limits  GLUCOSE, CAPILLARY - Abnormal; Notable for the following components:   Glucose-Capillary 113 (*)    All other components within normal limits  SARS CORONAVIRUS 2 BY RT PCR (HOSPITAL ORDER, [place])  MAGNESIUM  PHOSPHORUS   ____________________________________________  RADIOLOGY  None  ____________________________________________   PROCEDURES  Procedure(s) performed:   Procedures  None ____________________________________________   INITIAL IMPRESSION / ASSESSMENT AND PLAN / ED COURSE  Pertinent labs & imaging results that were available during my care of the patient were reviewed by me and  considered in my medical decision making (see chart for details).   Patient presents with hyponatremia found on outpatient labs in the [facility] system.  Review of the care everywhere section of the chart shows sodium today of 124.  Patient is having fatigue.  Suspect oxcarbazepine is the likely cause for her hyponatremia.  She appears euvolemic.  Have sent repeat labs and Covid swab.  Will start IV fluids at 125 mL's per hour.  Likely admit with symptomatic hyponatremia.   Discussed patient's case with [facility], Dr. Olevia Bowens to request admission. Patient and family (if present) updated with plan. Care transferred to [facility] service.  I reviewed all nursing notes, vitals, pertinent old records, EKGs, labs, imaging (as available).  ____________________________________________  FINAL CLINICAL IMPRESSION(S) / ED DIAGNOSES  Final diagnoses:  Hyponatremia     MEDICATIONS GIVEN DURING THIS VISIT:  Medications  enoxaparin (LOVENOX) injection 40 mg (40 mg Subcutaneous Given 01/29/20 2329)  0.9 %  sodium chloride infusion ( Intravenous New Bag/Given 01/30/20 0602)  acetaminophen (TYLENOL) tablet 650 mg (has no administration in time range)    Or  acetaminophen (TYLENOL) suppository 650 mg (has no administration in time range)  ondansetron (ZOFRAN) tablet 4 mg (has no administration in time range)    Or  ondansetron (ZOFRAN) injection 4 mg (has no administration in time range)  rOPINIRole (REQUIP) tablet 0.75 mg (0 mg Oral Hold 01/30/20 0845)  traMADol (ULTRAM) tablet 50 mg (50 mg Oral Given 01/30/20 0228)  prochlorperazine (COMPAZINE) tablet 5 mg (has no administration in time range)  polyethylene glycol (MIRALAX / GLYCOLAX) packet 17 g (has no administration in time range)  pantoprazole (PROTONIX) EC tablet 40 mg (40 mg Oral Given 01/29/20 2331)  montelukast (SINGULAIR) tablet 10 mg (10 mg Oral Given 01/29/20 2331)  metoprolol tartrate (LOPRESSOR) tablet 50 mg (50 mg Oral Given 01/30/20 0846)  melatonin  tablet 9 mg (9 mg Oral Given 01/29/20 2331)  loratadine (CLARITIN) tablet 10 mg (10 mg Oral Given 01/30/20 0846)  cyclobenzaprine (FLEXERIL) tablet 10 mg (10 mg Oral Given 01/30/20 0844)  gabapentin (NEURONTIN) capsule 400 mg (400 mg Oral Given 01/30/20 0846)  linaclotide (LINZESS) capsule 72 mcg (72 mcg Oral Given 01/30/20 0851)  enalapril (VASOTEC) tablet 10 mg (10 mg Oral Given 01/30/20 0847)  DULoxetine (CYMBALTA) DR capsule 60 mg (60 mg Oral Given 01/30/20 0845)  0.9 %  sodium chloride infusion ( Intravenous Stopped 01/29/20 2050)  sodium chloride  tablet 2 g (2 g Oral Given 01/29/20 2331)    Note:  This document was prepared using Dragon voice recognition software and may include unintentional dictation errors.  Nanda Quinton, MD, [facility] Emergency Medicine    Josph Norfleet, Wonda Olds, MD 01/30/20 [phone]

## 2020-01-29 NOTE — H&P (Signed)
History and Physical    Pamela Hatfield [id number] DOB: [birth year] DOA: 01/29/2020  PCP: Celene Squibb, MD   Patient coming from: Home.  I have personally briefly reviewed patient's old medical records in [place]  Chief Complaint: Low sodium level.  HPI: Pamela Hatfield is a 69 y.o. female with medical history significant of accessory spleen, osteoarthritis, asthma, carcinoid, depression, type 2 diabetes diet-controlled, history of unspecified dysrhythmia, fibromyalgia, GERD,/hiatal hernia, hyperlipidemia, hypertension, myalgia and myositis, sleep apnea who is coming to the emergency department due to abnormal lab result associated with progressively worse fatigue for about 4 days.  She was started on oxcarbazepine about a week ago.  She normally tries to drink fluids, so this may have contributed to the dilution of sodium.  She is also on duloxetine, which may potentiate the effect of the Trileptal.  She denies fever, chills, sore throat,, but stated that she recently has used very inhaler twice due to dyspnea, rhinorrhea, postnasal drip and wheezing.  Her symptoms are much better today.  She denies chest pain, palpitations, dizziness, diaphoresis, PND, orthopnea or pitting edema of the lower extremities.  No abdominal pain, nausea or emesis, diarrhea, melena or hematochezia.  She occasionally gets constipated.  No dysuria, frequency or hematuria.  No polyuria, polydipsia, polyphagia or blurred vision.  ED Course: Initial vital signs temperature 98.6 F, pulse 79, respirations 17, blood pressure 150/68 mmHg and O2 sat 99% on room air. The patient started on normal saline infusion in the emergency department.  Urinalysis showed moderate hemoglobinuria, but only 0-5 RBC/hpf and rare bacteria on microscopic examination. CBC shows a white count of 10.2, hemoglobin 12.2 g/dL and platelets 409. Sodium 127, potassium 4.0, chloride 92 and CO2 24 mmol/L. BUN 12, creatinine 0.90, glucose 140,  calcium 8.3, magnesium 1.9 and phosphorus 3.9 mg/dL. LFTs show an mildly increased alkaline phosphatase at 157 U/L and decreased total bilirubin at 0.2 mg/dL.  Review of Systems: As per HPI otherwise all other systems reviewed and are negative.  Past Medical History:  Diagnosis Date  . Accessory spleen   . Arthritis    multiple areas of her body  . Asthma   . Cancer ([place])    located on her aorta and small intestines- on Octreodtide 10mg  IM every 28 days for this  . Depression   . Diabetes mellitus    diet controlled  . Dyspnea    today 11/09/2017, reports that her breathing is normal for her   . Dysrhythmia    hx rapid heart beat  . Fibromyalgia   . GERD (gastroesophageal reflux disease)   . History of hiatal hernia   . Hyperlipidemia   . Hypertension   . Myalgia and myositis, unspecified   . Sleep apnea    no cpap used, pt does not like, use to use CPAP but due to insurance had to return the device   . Weight loss 02/25/2011   Past Surgical History:  Procedure Laterality Date  . ABDOMINAL HYSTERECTOMY  yrs ago   ovaries remain, done because of endometriosis  . ANTERIOR CERVICAL DECOMP/DISCECTOMY FUSION N/A 11/16/2017   Procedure: Anterior Cervical Decompression/Discectomy Fusion - Cervical four-Cervical five - Cervical five-Cervical six - Cervical six-Cervical seven;  Surgeon: Eustace Moore, MD;  Location: [facility];  Service: Neurosurgery;  Laterality: N/A;  . BACK SURGERY  yrs ago   lower back  . BREAST BIOPSY Bilateral    benign biopsies  . BREAST LUMPECTOMY     both breast  .  CARDIAC CATHETERIZATION    . CARPAL TUNNEL RELEASE  2003  . CHOLECYSTECTOMY  yrs ago  . ESOPHAGEAL DILATION  06/14/2012   Procedure: ESOPHAGEAL DILATION;  Surgeon: Daneil Dolin, MD;  [facility];  Service: Endoscopy;;  . EUS  07/13/2012   Procedure: UPPER ENDOSCOPIC ULTRASOUND (EUS) LINEAR;  Surgeon: Milus Banister, MD;  [facility];  Service: Endoscopy;  Laterality: N/A;    . NOSE SURGERY  yrs ago  . ROTATOR CUFF REPAIR  yrs ago   left   Social History  reports that she quit smoking about 27 years ago. Her smoking use included cigarettes. She has a 10.00 pack-year smoking history. She has never used smokeless tobacco. She reports that she does not drink alcohol or use drugs.  Allergies  Allergen Reactions  . Sulfa Antibiotics Hives  . Clarithromycin Rash  . Codeine Itching and Other (See Comments)    Headaches  . Lamotrigine Rash  . Tape Itching   Family History  Problem Relation Age of Onset  . Colon cancer Father   . Colon cancer Maternal Aunt   . Colon cancer Maternal Uncle    Prior to Admission medications   Medication Sig Start Date End Date Taking? Authorizing Provider  cyclobenzaprine (FLEXERIL) 5 MG tablet Take 5 mg by mouth 3 (three) times daily as needed for muscle spasms.    [provider]  DULoxetine (CYMBALTA) 60 MG capsule Take 60 mg by mouth daily.    [provider]  enalapril (VASOTEC) 10 MG tablet Take 10 mg by mouth daily.     [provider]  fluticasone (FLONASE) 50 MCG/ACT nasal spray Place 1-2 sprays into both nostrils daily as needed for allergies or rhinitis.    [provider]  gabapentin (NEURONTIN) 400 MG capsule Take 400 mg by mouth 4 (four) times daily.    [provider]  linaclotide (LINZESS) 72 MCG capsule Take 72 mcg by mouth daily before breakfast.    [provider]  Melatonin 10 MG TABS Take 10 mg by mouth at bedtime.    [provider]  metoprolol (LOPRESSOR) 50 MG tablet Take 50 mg by mouth 2 (two) times daily.    [provider]  montelukast (SINGULAIR) 10 MG tablet Take 10 mg by mouth daily.    [provider]  Multiple Vitamin (MULTIVITAMIN WITH MINERALS) TABS tablet Take 1 tablet by mouth daily.    [provider]  octreotide (SANDOSTATIN LAR) 10 MG injection Inject 10 mg into the muscle every 28 (twenty-eight) days.     [provider]  Oxcarbazepine (TRILEPTAL) 300 MG tablet Take 1 tablet (300 mg total) by mouth 2 (two) times daily. 01/23/20   Sater, Nanine Means, MD  pantoprazole (PROTONIX) 40 MG tablet Take 40 mg by mouth at bedtime.     [provider]  prochlorperazine (COMPAZINE) 5 MG tablet Take 5 mg by mouth every 6 (six) hours as needed for nausea or vomiting.    [provider]  rOPINIRole (REQUIP) 0.25 MG tablet Take 0.25 mg by mouth 3 (three) times daily.    [provider]   Physical Exam: Vitals:   01/29/20 1759 01/29/20 1801  BP: (!) 150/68   Pulse: 79   Resp: 17   Temp: 98.6 F (37 C)   TempSrc: Oral   SpO2: 99%   Weight:  88.5 kg  Height:  5\' 5"  (1.651 m)   Constitutional: NAD, calm, comfortable Eyes: PERRL, lids and  conjunctivae normal ENMT: Mucous membranes are moist. Posterior pharynx clear of any exudate or lesions. Neck: normal, supple, no masses, no thyromegaly Respiratory: clear to auscultation bilaterally, no wheezing, no crackles. Normal respiratory effort. No accessory muscle use.  Cardiovascular: Regular rate and rhythm, no murmurs / rubs / gallops. No extremity edema. 2+ pedal pulses. No carotid bruits.  Abdomen: Obese.  Nondistended.  BS positive.  Soft,.  No tenderness, no masses palpated. No hepatosplenomegaly. Musculoskeletal: Generalized weakness.  No clubbing / cyanosis. Good ROM, no contractures. Normal muscle tone.  Skin: no rashes, lesions, ulcers. No induration Neurologic: CN 2-12 grossly intact. Sensation intact, DTR normal. Strength 5/5 in all 4.  Psychiatric: Normal judgment and insight. Alert and oriented x 3. Normal mood.   Labs on Admission: I have personally reviewed following labs and imaging studies  CBC: Recent Labs  Lab 01/29/20 1905  WBC 10.2  NEUTROABS 6.7  HGB 12.2  HCT 39.3  MCV 91.0  PLT 409*    Basic Metabolic Panel: Recent Labs  Lab 01/29/20 1905  NA 127*  K 4.0  CL 92*  CO2 24  GLUCOSE 140*   BUN 12  CREATININE 0.90  CALCIUM 8.3*   GFR: Estimated Creatinine Clearance: 63.9 mL/min (by C-G formula based on SCr of 0.9 mg/dL).  Liver Function Tests: No results for input(s): AST, ALT, ALKPHOS, BILITOT, PROT, ALBUMIN in the last 168 hours.  Urine analysis:    Component Value Date/Time   COLORURINE YELLOW 01/29/2020 1945   APPEARANCEUR CLEAR 01/29/2020 1945   LABSPEC 1.013 01/29/2020 1945   PHURINE 5.0 01/29/2020 1945   GLUCOSEU NEGATIVE 01/29/2020 1945   HGBUR MODERATE (A) 01/29/2020 1945   BILIRUBINUR NEGATIVE 01/29/2020 1945   KETONESUR NEGATIVE 01/29/2020 1945   PROTEINUR NEGATIVE 01/29/2020 1945   UROBILINOGEN 0.2 05/06/2015 2000   NITRITE NEGATIVE 01/29/2020 1945   LEUKOCYTESUR NEGATIVE 01/29/2020 1945   Radiological Exams on Admission: No results found.  EKG: Independently reviewed.   Assessment/Plan Principal Problem:   Hyponatremia Observation/telemetry. Fluid restriction. Continue normal saline infusion. Sodium chloride tablets 2 g x 1. Hold Trileptal. Follow-up sodium level in a.m. Consult with neurology about cystic you.  Active Problems:   Type 2 diabetes mellitus (HCC) Carbohydrate modified diet. Follow-up CBG before meals and bedtime.    Polyneuropathy Continue Cymbalta 60 mg p.o. daily. Continue gabapentin 400 mg p.o. 4 times daily. On as needed tramadol.    Fibromyalgia On Cymbalta and gabapentin. Tramadol 50 mg p.o. every 6 hours as needed.    GERD (gastroesophageal reflux disease) Protonix 40 mg p.o. bedtime.    Mixed hyperlipidemia Continue diet and other lifestyle modifications.    Carcinoid tumor of  Abdomen Continue monthly Sandostatin injections.    Essential hypertension Continue metoprolol 50 mg p.o. twice daily. Continue enalapril 10 mg p.o. daily. Monitor BP, HR, renal function electrolytes.    Seasonal allergies Continue Flonase nasal spray daily as needed. Continue cetirizine or formulary  equivalent. Continue Singulair 10 mg p.o. bedtime.   DVT prophylaxis: Lovenox SQ. Code Status:   Full code. Family Communication:   Disposition Plan:   Patient is from:  Home.  Anticipated DC to:  Home.  Anticipated DC date:  01/30/2020.  Anticipated DC barriers: Clinical improvement in sodium level.  Consults called: Admission status:  Observation/telemetry.  Severity of Illness: Medium severity.  Reubin Milan MD [facility]  How to contact the [facility] Attending or Consulting provider [place] or covering provider during after hours [place], for this  patient?   1. Check the care team in [facility] and look for a) attending/consulting [facility] provider listed and b) the [facility] team listed 2. Log into [email or web address] and use [place]'s universal password to access. If you do not have the password, please contact the hospital operator. 3. Locate the [facility] provider you are looking for under [facility] and page to a number that you can be directly reached. 4. If you still have difficulty reaching the provider, please page the [facility] (Director on Call) for the Hospitalists listed on amion for assistance.  01/29/2020, 8:54 PM   This document was prepared using Dragon voice recognition software and may contain some unintended transcription errors.

## 2020-01-30 DIAGNOSIS — E871 Hypo-osmolality and hyponatremia: Secondary | ICD-10-CM | POA: Diagnosis not present

## 2020-01-30 LAB — RENAL FUNCTION PANEL
Albumin: 3.2 g/dL — ABNORMAL LOW (ref 3.5–5.0)
Anion gap: 9 (ref 5–15)
BUN: 10 mg/dL (ref 8–23)
CO2: 23 mmol/L (ref 22–32)
Calcium: 8.8 mg/dL — ABNORMAL LOW (ref 8.9–10.3)
Chloride: 99 mmol/L (ref 98–111)
Creatinine, Ser: 0.8 mg/dL (ref 0.44–1.00)
GFR calc Af Amer: >60 mL/min (ref >60)
GFR calc non Af Amer: >60 mL/min (ref >60)
Glucose, Bld: 139 mg/dL — ABNORMAL HIGH (ref 70–99)
Phosphorus: 4.2 mg/dL (ref 2.5–4.6)
Potassium: 4.4 mmol/L (ref 3.5–5.1)
Sodium: 131 mmol/L — ABNORMAL LOW (ref 135–145)

## 2020-01-30 LAB — GLUCOSE, CAPILLARY
Glucose-Capillary: 137 mg/dL — ABNORMAL HIGH (ref 70–99)
Glucose-Capillary: 113 mg/dL — ABNORMAL HIGH (ref 70–99)

## 2020-01-30 NOTE — Discharge Summary (Signed)
Physician Discharge Summary  Pamela Hatfield [id number] DOB: 12/04/1950 DOA: 01/29/2020  PCP: Celene Squibb, MD  Admit date: 01/29/2020  Discharge date: 01/30/2020  Admitted From:Home   Disposition:  Home  Recommendations for Outpatient Follow-up:  1. Follow up with PCP in 1-2 weeks and recheck BMP 2. Hold further use of Trileptal 3. Follow-up with neurology Dr. Felecia Shelling to consider another medication for neuropathic pain  Home Health: None  Equipment/Devices: None  Discharge Condition: Stable  CODE STATUS: Full  Diet recommendation: Heart Healthy/carb modified  Brief/Interim Summary: Per HPI: Pamela Hatfield is a 68 y.o. female with medical history significant of accessory spleen, osteoarthritis, asthma, carcinoid, depression, type 2 diabetes diet-controlled, history of unspecified dysrhythmia, fibromyalgia, GERD,/hiatal hernia, hyperlipidemia, hypertension, myalgia and myositis, sleep apnea who is coming to the emergency department due to abnormal lab result associated with progressively worse fatigue for about 4 days.  She was started on oxcarbazepine about a week ago.  She normally tries to drink fluids, so this may have contributed to the dilution of sodium.  She is also on duloxetine, which may potentiate the effect of the Trileptal.  She denies fever, chills, sore throat,, but stated that she recently has used very inhaler twice due to dyspnea, rhinorrhea, postnasal drip and wheezing.  Her symptoms are much better today.  She denies chest pain, palpitations, dizziness, diaphoresis, PND, orthopnea or pitting edema of the lower extremities.  No abdominal pain, nausea or emesis, diarrhea, melena or hematochezia.  She occasionally gets constipated.  No dysuria, frequency or hematuria.  No polyuria, polydipsia, polyphagia or blurred vision.  Patient was admitted with hyponatremia likely secondary to recent initiation of Trileptal.  She was also noted to have some excessive fatigue.  Her  symptoms began after recently initiating Trileptal which was prescribed to her for neuropathic pain by her neurologist.  She has not been drinking excessive fluid or watching her sodium intake excessively.  It appears that the majority of her symptoms are likely linked to the Trileptal especially in the setting of her Cymbalta use.  She has shown improvement in her sodium levels after normal saline infusion and serum sodium is approximately 131 today.  She is stable for discharge and continues to have some mild fatigue which should alleviate after discontinuing her Trileptal.  I have encouraged her to follow-up with her neurologist in the next 2 weeks to consider another medication for her neuropathic pain and additionally follow-up with her PCP in 1 week to reevaluate BMP.  She is in stable condition for discharge otherwise.  No other acute events noted throughout the course of this admission.  Notably, she also has a history of carcinoid tumor of the abdomen which could be contributing to a chronically hyponatremic state.  Discharge Diagnoses:  Principal Problem:   Hyponatremia Active Problems:   GERD (gastroesophageal reflux disease)   Mixed hyperlipidemia   Carcinoid tumor of abdomen   Essential hypertension   Polyneuropathy   Type 2 diabetes mellitus (HCC)   Fibromyalgia   Seasonal allergies  Principal discharge diagnosis: Asymptomatic hyponatremia secondary to Trileptal use.  Discharge Instructions  Discharge Instructions    Diet - low sodium heart healthy   Complete by: As directed    Increase activity slowly   Complete by: As directed      Allergies as of 01/30/2020      Reactions   Nickel Swelling   Sulfa Antibiotics Hives   Clarithromycin Rash   Codeine Itching, Other (See Comments)  Headaches   Lamotrigine Rash   Tape Itching      Medication List    STOP taking these medications   Oxcarbazepine 300 MG tablet Commonly known as: TRILEPTAL     TAKE these  medications   albuterol 108 (90 Base) MCG/ACT inhaler Commonly known as: VENTOLIN HFA Inhale 2 puffs into the lungs every 6 (six) hours as needed for wheezing or shortness of breath.   cetirizine 10 MG tablet Commonly known as: ZYRTEC Take 10 mg by mouth daily as needed for allergies.   cyclobenzaprine 10 MG tablet Commonly known as: FLEXERIL Take 10 mg by mouth 3 (three) times daily.   DULoxetine 60 MG capsule Commonly known as: CYMBALTA Take 60 mg by mouth daily.   fluticasone 50 MCG/ACT nasal spray Commonly known as: FLONASE Place 1-2 sprays into both nostrils daily as needed for allergies or rhinitis.   gabapentin 400 MG capsule Commonly known as: NEURONTIN Take 400 mg by mouth 4 (four) times daily.   Linzess 72 MCG capsule Generic drug: linaclotide Take 72 mcg by mouth daily before breakfast.   Melatonin 10 MG Tabs Take 10 mg by mouth at bedtime.   metoprolol tartrate 50 MG tablet Commonly known as: LOPRESSOR Take 50 mg by mouth 2 (two) times daily.   montelukast 10 MG tablet Commonly known as: SINGULAIR Take 10 mg by mouth at bedtime.   multivitamin with minerals Tabs tablet Take 1 tablet by mouth daily.   octreotide 10 MG injection Commonly known as: SANDOSTATIN LAR Inject 10 mg into the muscle every 28 (twenty-eight) days.   pantoprazole 40 MG tablet Commonly known as: PROTONIX Take 40 mg by mouth at bedtime.   polyethylene glycol powder 17 GM/SCOOP powder Commonly known as: GLYCOLAX/MIRALAX Take 17 g by mouth daily as needed for mild constipation or moderate constipation.   prochlorperazine 5 MG tablet Commonly known as: COMPAZINE Take 5 mg by mouth every 6 (six) hours as needed for nausea or vomiting.   rOPINIRole 0.25 MG tablet Commonly known as: REQUIP Take 0.75 mg by mouth 3 (three) times daily.   traMADol 50 MG tablet Commonly known as: ULTRAM Take 50 mg by mouth 4 (four) times daily.   Vasotec 10 MG tablet Generic drug:  enalapril Take 10 mg by mouth daily.      Follow-up Information    Celene Squibb, MD Follow up in 1 week(s).   Specialty: Internal Medicine Contact informationLinna Hoff [facility] [id number]          Allergies  Allergen Reactions  . Nickel Swelling  . Sulfa Antibiotics Hives  . Clarithromycin Rash  . Codeine Itching and Other (See Comments)    Headaches  . Lamotrigine Rash  . Tape Itching    Consultations:  None   Procedures/Studies:  No results found.  Discharge Exam: Vitals:   01/30/20 0219 01/30/20 0644  BP: 135/71 124/62  Pulse: 88 88  Resp: 20 20  Temp: 98.7 F (37.1 C) 99 F (37.2 C)  SpO2: 97% 95%   Vitals:   01/29/20 2233 01/29/20 2233 01/30/20 0219 01/30/20 0644  BP:  (!) 167/72 135/71 124/62  Pulse:  74 88 88  Resp:  14 20 20   Temp:  98.4 F (36.9 C) 98.7 F (37.1 C) 99 F (37.2 C)  TempSrc:  Oral Oral Oral  SpO2:  99% 97% 95%  Weight:  89.8 kg    Height: 5\' 5"  (1.651 m)       General: Pt is alert, awake,  not in acute distress Cardiovascular: RRR, S1/S2 +, no rubs, no gallops Respiratory: CTA bilaterally, no wheezing, no rhonchi Abdominal: Soft, NT, ND, bowel sounds + Extremities: no edema, no cyanosis    The results of significant diagnostics from this hospitalization (including imaging, microbiology, ancillary and laboratory) are listed below for reference.     Microbiology: Recent Results (from the past 240 hour(s))  SARS Coronavirus 2 by RT PCR (hospital order, performed in [facility] hospital lab) Nasopharyngeal Nasopharyngeal Swab     Status: None   Collection Time: 01/29/20  7:45 PM   Specimen: Nasopharyngeal Swab  Result Value Ref Range Status   SARS Coronavirus 2 NEGATIVE NEGATIVE Final    Comment: (NOTE) SARS-CoV-2 target nucleic acids are NOT DETECTED. The SARS-CoV-2 RNA is generally detectable in upper and lower respiratory specimens during the acute phase of infection. The lowest concentration of SARS-CoV-2 viral copies  this assay can detect is 250 copies / mL. A negative result does not preclude SARS-CoV-2 infection and should not be used as the sole basis for treatment or other patient management decisions.  A negative result may occur with improper specimen collection / handling, submission of specimen other than nasopharyngeal swab, presence of viral mutation(s) within the areas targeted by this assay, and inadequate number of viral copies (<250 copies / mL). A negative result must be combined with clinical observations, patient history, and epidemiological information. Fact Sheet for Patients:   StrictlyIdeas.no Fact Sheet for Healthcare Providers: BankingDealers.co.za This test is not yet approved or cleared  by the [place] FDA and has been authorized for detection and/or diagnosis of SARS-CoV-2 by FDA under an Emergency Use Authorization (EUA).  This EUA will remain in effect (meaning this test can be used) for the duration of the COVID-19 declaration under Section 564(b)(1) of the Act, 21 U.S.C. section 360bbb-3(b)(1), unless the authorization is terminated or revoked sooner. Performed at [facility], [address], [place], [place] [address]      Labs: BNP (last 3 results) No results for input(s): BNP in the last 8760 hours. Basic Metabolic Panel: Recent Labs  Lab 01/29/20 1905 01/30/20 0542  NA 127* 131*  K 4.0 4.4  CL 92* 99  CO2 24 23  GLUCOSE 140* 139*  BUN 12 10  CREATININE 0.90 0.80  CALCIUM 8.3* 8.8*  MG 1.9  --   PHOS 3.9 4.2   Liver Function Tests: Recent Labs  Lab 01/29/20 1905 01/30/20 0542  AST 24  --   ALT 20  --   ALKPHOS 157*  --   BILITOT 0.2*  --   PROT 7.7  --   ALBUMIN 3.5 3.2*   No results for input(s): LIPASE, AMYLASE in the last 168 hours. No results for input(s): AMMONIA in the last 168 hours. CBC: Recent Labs  Lab 01/29/20 1905  WBC 10.2  NEUTROABS 6.7  HGB 12.2  HCT 39.3  MCV 91.0   PLT 409*   Cardiac Enzymes: No results for input(s): CKTOTAL, CKMB, CKMBINDEX, TROPONINI in the last 168 hours. BNP: Invalid input(s): POCBNP CBG: Recent Labs  Lab 01/30/20 0749  GLUCAP 137*   D-Dimer No results for input(s): DDIMER in the last 72 hours. Hgb A1c No results for input(s): HGBA1C in the last 72 hours. Lipid Profile No results for input(s): CHOL, HDL, LDLCALC, TRIG, CHOLHDL, LDLDIRECT in the last 72 hours. Thyroid function studies No results for input(s): TSH, T4TOTAL, T3FREE, THYROIDAB in the last 72 hours.  Invalid input(s): FREET3 Anemia work up  No results for input(s): VITAMINB12, FOLATE, FERRITIN, TIBC, IRON, RETICCTPCT in the last 72 hours. Urinalysis    Component Value Date/Time   COLORURINE YELLOW 01/29/2020 1945   APPEARANCEUR CLEAR 01/29/2020 1945   LABSPEC 1.013 01/29/2020 1945   PHURINE 5.0 01/29/2020 1945   GLUCOSEU NEGATIVE 01/29/2020 1945   HGBUR MODERATE (A) 01/29/2020 1945   BILIRUBINUR NEGATIVE 01/29/2020 1945   KETONESUR NEGATIVE 01/29/2020 1945   PROTEINUR NEGATIVE 01/29/2020 1945   UROBILINOGEN 0.2 05/06/2015 2000   NITRITE NEGATIVE 01/29/2020 1945   LEUKOCYTESUR NEGATIVE 01/29/2020 1945   Sepsis Labs Invalid input(s): PROCALCITONIN,  WBC,  LACTICIDVEN Microbiology Recent Results (from the past 240 hour(s))  SARS Coronavirus 2 by RT PCR (hospital order, performed in [place] hospital lab) Nasopharyngeal Nasopharyngeal Swab     Status: None   Collection Time: 01/29/20  7:45 PM   Specimen: Nasopharyngeal Swab  Result Value Ref Range Status   SARS Coronavirus 2 NEGATIVE NEGATIVE Final    Comment: (NOTE) SARS-CoV-2 target nucleic acids are NOT DETECTED. The SARS-CoV-2 RNA is generally detectable in upper and lower respiratory specimens during the acute phase of infection. The lowest concentration of SARS-CoV-2 viral copies this assay can detect is 250 copies / mL. A negative result does not preclude SARS-CoV-2 infection and  should not be used as the sole basis for treatment or other patient management decisions.  A negative result may occur with improper specimen collection / handling, submission of specimen other than nasopharyngeal swab, presence of viral mutation(s) within the areas targeted by this assay, and inadequate number of viral copies (<250 copies / mL). A negative result must be combined with clinical observations, patient history, and epidemiological information. Fact Sheet for Patients:   StrictlyIdeas.no Fact Sheet for Healthcare Providers: BankingDealers.co.za This test is not yet approved or cleared  by the [place] FDA and has been authorized for detection and/or diagnosis of SARS-CoV-2 by FDA under an Emergency Use Authorization (EUA).  This EUA will remain in effect (meaning this test can be used) for the duration of the COVID-19 declaration under Section 564(b)(1) of the Act, 21 U.S.C. section 360bbb-3(b)(1), unless the authorization is terminated or revoked sooner. Performed at [facility], [address] [place], [place] [address]      Time coordinating discharge: 35 minutes  SIGNED:   Rodena Goldmann, DO [facility] 01/30/2020, 10:37 AM  If 7PM-7AM, please contact night-coverage [email or web address]

## 2020-01-30 NOTE — Plan of Care (Signed)

## 2020-01-30 NOTE — Discharge Instructions (Signed)
Hyponatremia Hyponatremia is when the amount of salt (sodium) in your blood is too low. When salt levels are low, your body may take in extra water. This can cause swelling throughout the body. The swelling often affects the brain. What are the causes? This condition may be caused by:  Certain medical problems or conditions.  Vomiting a lot.  Having watery poop (diarrhea) often.  Certain medicines or illegal drugs.  Not having enough water in the body (dehydration).  Drinking too much water.  Eating a diet that is low in salt.  Large burns on your body.  Too much sweating. What increases the risk? You are more likely to get this condition if you:  Have long-term (chronic) kidney disease.  Have heart failure.  Have a medical condition that causes you to have watery poop often.  Do very hard exercises.  Take medicines that affect the amount of salt is in your blood. What are the signs or symptoms? Symptoms of this condition include:  Headache.  Feeling like you may vomit (nausea).  Vomiting.  Being very tired (lethargic).  Muscle weakness and cramps.  Not wanting to eat as much as normal (loss of appetite).  Feeling weak or light-headed. Severe symptoms of this condition include:  Confusion.  Feeling restless (agitation).  Having a fast heart rate.  Passing out (fainting).  Seizures.  Coma. How is this treated? Treatment for this condition depends on the cause. Treatment may include:  Getting fluids through an IV tube that is put into one of your veins.  Taking medicines to fix the salt levels in your blood. If medicines are causing the problem, your medicines will need to be changed.  Limiting how much water or fluid you take in.  Monitoring in the hospital to watch your symptoms. Follow these instructions at home:   Take over-the-counter and prescription medicines only as told by your doctor. Many medicines can make this condition worse.  Talk with your doctor about any medicines that you are taking.  Eat and drink exactly as you are told by your doctor. ? Eat only the foods you are told to eat. ? Limit how much fluid you take.  Do not drink alcohol.  Keep all follow-up visits as told by your doctor. This is important. Contact a doctor if:  You feel more like you may vomit.  You feel more tired.  Your headache gets worse.  You feel more confused.  You feel weaker.  Your symptoms go away and then they come back.  You have trouble following the diet instructions. Get help right away if:  You have a seizure.  You pass out.  You keep having watery poop.  You keep vomiting. Summary  Hyponatremia is when the amount of salt in your blood is too low.  When salt levels are low, you can have swelling throughout the body. The swelling mostly affects the brain.  Treatment depends on the cause. Treatment may include getting IV fluids, medicines, or not drinking as much fluid. This information is not intended to replace advice given to you by your health care provider. Make sure you discuss any questions you have with your health care provider. Document Revised: 11/16/2018 Document Reviewed: 08/03/2018 Elsevier Patient Education  2020 [employer]   Water Intoxication Water intoxication is a condition that can result when you drink water faster than your body can remove it. This can happen when you drink a lot of water very quickly over a short period of   time. Water intoxication can cause fluid buildup that affects your brain and lungs. Excess water can also cause the amount of salt in your blood to become too low (hyponatremia). This condition can range from mild to severe. What are the causes? This condition is caused by drinking water faster than your body can remove it. What increases the risk? You are more likely to develop this condition if you:  Drink water often during an endurance event that lasts  more than 4 hours.  Drink more than 17-34 oz (500-1,000 mL) of water or sports drinks per hour while exercising.  Have a health condition that makes it hard for your body to get rid of excess water, such as sickle cell anemia.  Have other health conditions that increase the risk for water intoxication, such as cystic fibrosis.  Have been losing fluids, sodium, and nutrients through heavy sweating, vomiting, or diarrhea.  Have a health condition that causes excessive thirst and cravings for water. What are the signs or symptoms? Symptoms can appear up to 24 hours after drinking too much water. Moderate symptoms of this condition include:  Headache.  Nausea or vomiting.  Loss of appetite.  Cramps in the abdomen.  Gaining weight several hours after an endurance activity. Severe symptoms of this condition include:  Changed mental state, such as confusion or irritability.  Trouble breathing.  Irregular heartbeat.  Convulsions.  Fluttering eyelids.  Unconsciousness or coma. How is this diagnosed? This condition is diagnosed based on your medical history, symptoms, and a physical exam. You may also have tests done, such as:  Blood tests.  Urine tests. How is this treated? This condition may be treated by:  Limiting how much water and other fluids you drink.  Taking oral sodium tablets.  Drinking beverages that have a high sodium content.  Receiving medicines through an IV to control your symptoms. Severe symptoms may need to be monitored and managed in a hospital setting. Follow these instructions at home:   Limit how much water or other fluids you drink as told by your health care provider.  Take over-the-counter and prescription medicines only as told by your health care provider. This includes any sodium tablets.  Keep all follow-up visits as told by your health care provider. This is important. How is this prevented?  Educate ultra-endurance athletes, such  as runners, to drink mostly when thirsty and to avoid drinking large amounts of water in a short time.  Correct underlying conditions that cause increased thirst and excessive intake of water. Contact a health care provider if:  You continue to have symptoms of water intoxication after treatment. Get help right away if:  You have a seizure.  You faint.  You become confused or disoriented.  You have changes in your mood or behavior. Summary  Water intoxication is a condition that can result when you drink water faster than your body can remove it.  Symptoms can appear up to 24 hours after drinking too much water.  Educate ultra-endurance athletes, such as runners, to drink mostly when thirsty and to avoid drinking larger amounts of free water in a short time.  Contact a health care provider if you continue to have symptoms of water intoxication after treatment.  Keep all follow-up visits as told by your health care provider. This information is not intended to replace advice given to you by your health care provider. Make sure you discuss any questions you have with your health care provider. Document Revised: 04/06/2018 Document   Reviewed: 04/06/2018 Elsevier Patient Education  2020 [employer] Inc 

## 2020-02-01 ENCOUNTER — Other Ambulatory Visit: Payer: Self-pay

## 2020-02-01 ENCOUNTER — Ambulatory Visit: Payer: PPO | Admitting: Orthopedic Surgery

## 2020-02-01 VITALS — BP 124/75 | HR 87 | Ht 65.0 in | Wt 190.0 lb

## 2020-02-01 DIAGNOSIS — G5602 Carpal tunnel syndrome, left upper limb: Secondary | ICD-10-CM

## 2020-02-01 DIAGNOSIS — M65332 Trigger finger, left middle finger: Secondary | ICD-10-CM

## 2020-02-01 DIAGNOSIS — G629 Polyneuropathy, unspecified: Secondary | ICD-10-CM | POA: Diagnosis not present

## 2020-02-01 NOTE — Patient Instructions (Signed)
Trigger Finger  Trigger finger, also called stenosing tenosynovitis,  is a condition that causes a finger to get stuck in a bent position. Each finger has a tendon, which is a tough, cord-like tissue that connects muscle to bone, and each tendon passes through a tunnel of tissue called a tendon sheath. To move your finger, your tendon needs to glide freely through the sheath. Trigger finger happens when the tendon or the sheath thickens, making it difficult to move your finger. Trigger finger can affect any finger or a thumb. It may affect more than one finger. Mild cases may clear up with rest and medicine. Severe cases require more treatment. What are the causes? Trigger finger is caused by a thickened finger tendon or tendon sheath. The cause of this thickening is not known. What increases the risk? The following factors may make you more likely to develop this condition:  Doing activities that require a strong grip.  Having rheumatoid arthritis, gout, or diabetes.  Being 70-71 years old.  Being female. What are the signs or symptoms? Symptoms of this condition include:  Pain when bending or straightening your finger.  Tenderness or swelling where your finger attaches to the palm of your hand.  A lump in the palm of your hand or on the inside of your finger.  Hearing a noise like a pop or a snap when you try to straighten your finger.  Feeling a catching or locking sensation when you try to straighten your finger.  Being unable to straighten your finger. How is this diagnosed? This condition is diagnosed based on your symptoms and a physical exam. How is this treated? This condition may be treated by:  Resting your finger and avoiding activities that make symptoms worse.  Wearing a finger splint to keep your finger extended.  Taking NSAIDs, such as ibuprofen, to relieve pain and swelling.  Doing gentle exercises to stretch the finger as told by your health care  provider.  Having medicine that reduces swelling and inflammation (steroids) injected into the tendon sheath. Injections may need to be repeated.  Having surgery to open the tendon sheath. This may be done if other treatments do not work and you cannot straighten your finger. You may need physical therapy after surgery. Follow these instructions at home: If you have a splint:  Wear the splint as told by your health care provider. Remove it only as told by your health care provider.  Loosen it if your fingers tingle, become numb, or turn cold and blue.  Keep it clean.  If the splint is not waterproof: ? Do not let it get wet. ? Cover it with a watertight covering when you take a bath or shower. Managing pain, stiffness, and swelling     If directed, apply heat to the affected area as often as told by your health care provider. Use the heat source that your health care provider recommends, such as a moist heat pack or a heating pad.  Place a towel between your skin and the heat source.  Leave the heat on for 20-30 minutes.  Remove the heat if your skin turns bright red. This is especially important if you are unable to feel pain, heat, or cold. You may have a greater risk of getting burned. If directed, put ice on the painful area. To do this:  If you have a removable splint, remove it as told by your health care provider.  Put ice in a plastic bag.  Place a  towel between your skin and the bag or between your splint and the bag.  Leave the ice on for 20 minutes, 2-3 times a day.  Activity  Rest your finger as told by your health care provider. Avoid activities that make the pain worse.  Return to your normal activities as told by your health care provider. Ask your health care provider what activities are safe for you.  Do exercises as told by your health care provider.  Ask your health care provider when it is safe to drive if you have a splint on your hand. General  instructions  Take over-the-counter and prescription medicines only as told by your health care provider.  Keep all follow-up visits as told by your health care provider. This is important. Contact a health care provider if:  Your symptoms are not improving with home care. Summary  Trigger finger, also called stenosing tenosynovitis, causes your finger to get stuck in a bent position. This can make it difficult and painful to straighten your finger.  This condition develops when a finger tendon or tendon sheath thickens.  Treatment may include resting your finger, wearing a splint, and taking medicines.  In severe cases, surgery to open the tendon sheath may be needed. This information is not intended to replace advice given to you by your health care provider. Make sure you discuss any questions you have with your health care provider. Document Revised: 01/15/2019 Document Reviewed: 01/15/2019 Elsevier Patient Education  [place].  Open Carpal Tunnel Release  Open carpal tunnel release is a surgery to relieve symptoms caused by carpal tunnel syndrome. The carpal tunnel is a narrow, hollow space in the wrist. It is located between the wrist bones and a band of connective tissue (transverse carpal ligament, also known as the flexor retinaculum). The nerve that supplies most of the hand (median nerve) passes through the carpal tunnel, and so do tissues that connect bones to muscles (tendons) in the hand and arm. Carpal tunnel syndrome makes this space swell and become narrow. The swelling pinches the median nerve and causes pain and numbness. During carpal tunnel release surgery, the transverse carpal ligament is cut to make more room in the carpal tunnel space. This also lessens the pressure on the median nerve. You may have this surgery if other types of treatment have not relieved your carpal tunnel symptoms. This surgery is usually done only for the hand that you use more often  (dominant hand), but it may be done for both hands depending on your symptoms. Tell a health care provider about:  Any allergies you have.  All medicines you are taking, including vitamins, herbs, eye drops, creams, and over-the-counter medicines.  Any problems you or family members have had with anesthetic medicines.  Any blood disorders you have.  Any surgeries you have had.  Any medical conditions you have.  Whether you are pregnant or may be pregnant. What are the risks? Generally, this is a safe procedure. However, problems may occur, including:  Infection.  Bleeding.  Injury to the median nerve.  Allergic reactions to medicines.  The surgery failing to relieve your symptoms, or making your symptoms worse. What happens before the procedure? Medicines  Ask your health care provider about: ? Changing or stopping your regular medicines. This is especially important if you are taking diabetes medicines or blood thinners. ? Taking medicines such as aspirin and ibuprofen. These medicines can thin your blood. Do not take these medicines unless your health care  provider tells you to take them. ? Taking over-the-counter medicines, vitamins, herbs, and supplements.  You may be given antibiotic medicine to help prevent infection. Staying hydrated Follow instructions from your health care provider about hydration, which may include:  Up to 2 hours before the procedure - you may continue to drink clear liquids, such as water, clear fruit juice, black coffee, and plain tea. Eating and drinking restrictions Follow instructions from your health care provider about eating and drinking, which may include:  8 hours before the procedure - stop eating heavy meals or foods such as meat, fried foods, or fatty foods.  6 hours before the procedure - stop eating light meals or foods, such as toast or cereal.  6 hours before the procedure - stop drinking milk or drinks that contain  milk.  2 hours before the procedure - stop drinking clear liquids. General instructions  Ask your health care provider how your surgical site will be marked or identified.  You may be asked to shower with a germ-killing soap.  Plan to have someone take you home from the hospital or clinic.  Plan to have a responsible adult care for you for at least 24 hours after you leave the hospital or clinic. This is important. What happens during the procedure?  To lower your risk of infection: ? Your health care team will wash or sanitize their hands. ? Hair may be removed from the surgical area. ? Your arm, hand, and wrist will be cleaned with a germ-killing (antiseptic) solution.  An IV will be inserted into one of your veins.  You will be given one of the following: ? A medicine to numb the wrist area (local anesthetic). You may also be given a medicine to help you relax (sedative). ? A medicine to make you fall asleep (general anesthetic).  An incision will be made in your wrist, on the same side as your palm.  The skin of your wrist will be spread to expose the transverse carpal ligament.  The transverse carpal ligament will be cut to make more room in the carpal tunnel space.  Your incision will be closed with stitches (sutures) or staples.  A bandage (dressing) will be placed over your wrist and wrapped around your hand and wrist. The procedure may vary among health care providers and hospitals. What happens after the procedure?  Your blood pressure, heart rate, breathing rate, and blood oxygen level will be monitored until the medicines you were given have worn off.  You will be given pain medicine as needed.  A splint or brace may be placed over your dressing, to hold your hand and wrist in place while you heal.  Do not drive until your health care provider approves. Summary  Carpal tunnel release is a surgery to relieve pain and numbness in the hand caused by swelling  around a nerve (carpal tunnel syndrome).  You may have this surgery if other types of treatment have not relieved your carpal tunnel symptoms.  During carpal tunnel release surgery, a band of connective tissue (transverse carpal ligament) is cut to make more room in the carpal tunnel space. This information is not intended to replace advice given to you by your health care provider. Make sure you discuss any questions you have with your health care provider. Document Revised: 08/12/2017 Document Reviewed: 05/09/2017 Elsevier Patient Education  2020 [employer].

## 2020-02-01 NOTE — Progress Notes (Signed)
NEW PROBLEM//OFFICE VISIT  Chief Complaint  Patient presents with  . Carpal Tunnel    left   . Hand Problem    right middle finger / catching     69 year old female referred to [place] by [facility] neurology with a positive nerve conduction study for left carpal tunnel syndrome and axonal polyneuropathy  Patient is status post right carpal tunnel release she has triggering and catching of the right long finger with numbness and tingling fingertips left hand tingling numbness in the thumb and index finger and radial border of the long finger with a burning sensation.  Patient denies any weakness or difficulty picking up small fine objects but does complain of increased pain at night on the left side     Review of Systems  HENT: Positive for tinnitus.   Respiratory: Positive for shortness of breath.   Musculoskeletal: Positive for back pain, myalgias and neck pain.  Neurological: Positive for dizziness and tingling.  Endo/Heme/Allergies: Positive for environmental allergies. Bruises/bleeds easily.  Psychiatric/Behavioral: Positive for depression and memory loss.  All other systems reviewed and are negative.    Past Medical History:  Diagnosis Date  . Accessory spleen   . Arthritis    multiple areas of her body  . Asthma   . Cancer ([place])    located on her aorta and small intestines- on Octreodtide 10mg  IM every 28 days for this  . Depression   . Diabetes mellitus    diet controlled  . Dyspnea    today 11/09/2017, reports that her breathing is normal for her   . Dysrhythmia    hx rapid heart beat  . Fibromyalgia   . GERD (gastroesophageal reflux disease)   . History of hiatal hernia   . Hyperlipidemia   . Hypertension   . Myalgia and myositis, unspecified   . Sleep apnea    no cpap used, pt does not like, use to use CPAP but due to insurance had to return the device   . Weight loss 02/25/2011    Past Surgical History:  Procedure Laterality Date  . ABDOMINAL HYSTERECTOMY   yrs ago   ovaries remain, done because of endometriosis  . ANTERIOR CERVICAL DECOMP/DISCECTOMY FUSION N/A 11/16/2017   Procedure: Anterior Cervical Decompression/Discectomy Fusion - Cervical four-Cervical five - Cervical five-Cervical six - Cervical six-Cervical seven;  Surgeon: Eustace Moore, MD;  Location: [place];  Service: Neurosurgery;  Laterality: N/A;  . BACK SURGERY  yrs ago   lower back  . BREAST BIOPSY Bilateral    benign biopsies  . BREAST LUMPECTOMY     both breast  . CARDIAC CATHETERIZATION    . CARPAL TUNNEL RELEASE  2003  . CHOLECYSTECTOMY  yrs ago  . ESOPHAGEAL DILATION  06/14/2012   Procedure: ESOPHAGEAL DILATION;  Surgeon: Daneil Dolin, MD;  Location: [facility];  Service: Endoscopy;;  . EUS  07/13/2012   Procedure: UPPER ENDOSCOPIC ULTRASOUND (EUS) LINEAR;  Surgeon: Milus Banister, MD;  Location: [facility];  Service: Endoscopy;  Laterality: N/A;  . NOSE SURGERY  yrs ago  . ROTATOR CUFF REPAIR  yrs ago   left    Family History  Problem Relation Age of Onset  . Colon cancer Father   . Colon cancer Maternal Aunt   . Colon cancer Maternal Uncle    Social History   Tobacco Use  . Smoking status: Former Smoker    Packs/day: 0.50    Years: 20.00    Pack years: 10.00  Types: Cigarettes    Quit date: 09/13/1992    Years since quitting: 27.4  . Smokeless tobacco: Never Used  . Tobacco comment: quit about 25 + years ago  Substance Use Topics  . Alcohol use: No  . Drug use: No    Allergies  Allergen Reactions  . Nickel Swelling  . Sulfa Antibiotics Hives  . Clarithromycin Rash  . Codeine Itching and Other (See Comments)    Headaches  . Lamotrigine Rash  . Tape Itching    Current Meds  Medication Sig  . albuterol (VENTOLIN HFA) 108 (90 Base) MCG/ACT inhaler Inhale 2 puffs into the lungs every 6 (six) hours as needed for wheezing or shortness of breath.   . cetirizine (ZYRTEC) 10 MG tablet Take 10 mg by mouth daily as needed for allergies.   .  cyclobenzaprine (FLEXERIL) 10 MG tablet Take 10 mg by mouth 3 (three) times daily.  . DULoxetine (CYMBALTA) 60 MG capsule Take 60 mg by mouth daily.  . enalapril (VASOTEC) 10 MG tablet Take 10 mg by mouth daily.   . fluticasone (FLONASE) 50 MCG/ACT nasal spray Place 1-2 sprays into both nostrils daily as needed for allergies or rhinitis.  Marland Kitchen gabapentin (NEURONTIN) 400 MG capsule Take 400 mg by mouth 4 (four) times daily.  Marland Kitchen linaclotide (LINZESS) 72 MCG capsule Take 72 mcg by mouth daily before breakfast.  . Melatonin 10 MG TABS Take 10 mg by mouth at bedtime.  . metoprolol (LOPRESSOR) 50 MG tablet Take 50 mg by mouth 2 (two) times daily.  . montelukast (SINGULAIR) 10 MG tablet Take 10 mg by mouth at bedtime.   . Multiple Vitamin (MULTIVITAMIN WITH MINERALS) TABS tablet Take 1 tablet by mouth daily.  Marland Kitchen octreotide (SANDOSTATIN LAR) 10 MG injection Inject 10 mg into the muscle every 28 (twenty-eight) days.  . pantoprazole (PROTONIX) 40 MG tablet Take 40 mg by mouth at bedtime.   . polyethylene glycol powder (GLYCOLAX/MIRALAX) 17 GM/SCOOP powder Take 17 g by mouth daily as needed for mild constipation or moderate constipation.  . prochlorperazine (COMPAZINE) 5 MG tablet Take 5 mg by mouth every 6 (six) hours as needed for nausea or vomiting.  Marland Kitchen rOPINIRole (REQUIP) 0.25 MG tablet Take 0.75 mg by mouth 3 (three) times daily.   . traMADol (ULTRAM) 50 MG tablet Take 50 mg by mouth 4 (four) times daily.    BP 124/75   Pulse 87   Ht 5\' 5"  (1.651 m)   Wt 190 lb (86.2 kg)   BMI 31.62 kg/m   Physical Exam Constitutional:      General: She is not in acute distress.    Appearance: She is well-developed.  Cardiovascular:     Comments: No peripheral edema Skin:    General: Skin is warm and dry.  Neurological:     Mental Status: She is alert and oriented to person, place, and time.     Sensory: No sensory deficit.     Coordination: Coordination normal.     Gait: Gait normal.     Deep Tendon  Reflexes: Reflexes are normal and symmetric.     Ortho Exam  Right hand  There is no atrophy or skin changes in the right hand she has a previously noted carpal tunnel incision on the volar aspect of the wrist which healed with no signs of problems they are  She does have a slightly flexed long finger with pain over the A1 pulley decreased flexion and extension  Flexor and extensor tendons  are intact  She does have some sensory loss in the fingertips.  On the left hand she has a positive Tinel's sign positive Phalen sign and a positive carpal tunnel compression test  No atrophy in the thenar or hyperthenar eminences full range of motion at the wrist and fingers flexor tendons intact extensor tendons intact   MEDICAL DECISION MAKING  A.  Encounter Diagnoses  Name Primary?  . Carpal tunnel syndrome, left upper limb Yes  . Trigger finger, left middle finger   . Axonal polyneuropathy     B. DATA ANALYSED:  NCS//the impression on the nerve study was severe median neuropathy across the left wrist severe carpal tunnel syndrome minimal median neuropathy across the right wrist and length dependent axonal polyneuropathy affecting small fibers and large fibers  IMAGING: Independent interpretation of images: none   Orders: none   Outside records reviewed: d/c summary from recent hospitalizations for hyponatremia  C. MANAGEMENT   We injected the right long finger for triggering  Trigger finger injection  Diagnosis tenosynovitis right long finger Procedure injection A1 pulley Medications lidocaine 1% 1 mL and Depo-Medrol 40 mg 1 mL Skin prep alcohol and ethyl chloride Verbal consent was obtained Timeout confirmed the injection site  After cleaning the skin with alcohol and anesthetizing the skin with ethyl chloride the A1 pulley was palpated and the injection was performed without complication  She is also scheduled for a left carpal tunnel release.  We discussed the  polyneuropathy in the effects of carpal tunnel release which relieves pressure but does not restore complete nerve function.  The procedure has been fully reviewed with the patient; The risks and benefits of surgery have been discussed and explained and understood. Alternative treatment has also been reviewed, questions were encouraged and answered. The postoperative plan is also been reviewed.   LEFT CTR    No orders of the defined types were placed in this encounter.     [name], MD  02/01/2020 9:48 AM

## 2020-02-04 DIAGNOSIS — N281 Cyst of kidney, acquired: Secondary | ICD-10-CM | POA: Diagnosis not present

## 2020-02-04 DIAGNOSIS — K760 Fatty (change of) liver, not elsewhere classified: Secondary | ICD-10-CM | POA: Diagnosis not present

## 2020-02-04 DIAGNOSIS — E871 Hypo-osmolality and hyponatremia: Secondary | ICD-10-CM | POA: Diagnosis not present

## 2020-02-04 NOTE — Patient Instructions (Signed)
NOHELY GUIA  02/04/2020     @PREFPERIOPPHARMACY @   Your procedure is scheduled on  02/12/2020 .  Report to [facility] at  1020  A.M.  Call this number if you have problems the morning of surgery:  [phone]   Remember:  Do not eat or drink after midnight.                          Take these medicines the morning of surgery with A SIP OF WATER  Zyrtec, flexeril, cymbalta, gabapentin, metoprolol, compazine(if needed), requip, tramadol(if needed).    Do not wear jewelry, make-up or nail polish.  Do not wear lotions, powders, or perfumes. Please wear deodorant and brush your teeth.  Do not shave 48 hours prior to surgery.  Men may shave face and neck.  Do not bring valuables to the hospital.  [facility] is not responsible for any belongings or valuables.  Contacts, dentures or bridgework may not be worn into surgery.  Leave your suitcase in the car.  After surgery it may be brought to your room.  For patients admitted to the hospital, discharge time will be determined by your treatment team.  Patients discharged the day of surgery will not be allowed to drive home.   Name and phone number of your driver:   family Special instructions:  DO NOT smoke the morning of your procedure.  Please read over the following fact sheets that you were given. Anesthesia Post-op Instructions and Care and Recovery After Surgery       Open Carpal Tunnel Release, Care After This sheet gives you information about how to care for yourself after your procedure. Your health care provider may also give you more specific instructions. If you have problems or questions, contact your health care provider. What can I expect after the procedure? After the procedure, it is common to have:  Wrist stiffness.  Bruising. Follow these instructions at home: Bathing  Do not take baths, swim, or use a hot tub until your health care provider approves. Ask your health care provider if you may  take showers.  Keep your bandage (dressing) dry until your health care provider says it can be removed. If you have a splint or brace:  Wear the splint or brace as told by your health care provider. You may need to wear it for 2-3 weeks. Remove it only as told by your health care provider.  Loosen the splint or brace if your fingers tingle, become numb, or turn cold and blue.  Keep the splint or brace clean.  If the splint or brace is not waterproof: ? Do not let it get wet. ? Cover it with a watertight covering when you take a bath or a shower. Incision care   Follow instructions from your health care provider about how to take care of your incision. Make sure you: ? Wash your hands with soap and water before you change your dressing. If soap and water are not available, use hand sanitizer. ? Change your dressing as told by your health care provider. ? Leave stitches (sutures), skin glue, or adhesive strips in place. These skin closures may need to stay in place for 2 weeks or longer. If adhesive strip edges start to loosen and curl up, you may trim the loose edges. Do not remove adhesive strips completely unless your health care provider tells you to do that.  Check your incision area every day for signs of infection. Check for: ? Redness, swelling, or pain. ? Fluid or blood. ? Warmth. ? Pus or a bad smell. Managing pain, stiffness, and swelling   If directed, put ice on the affected area. ? If you have a removable splint or brace, remove it as told by your health care provider. ? Put ice in a plastic bag. ? Place a towel between your skin and the bag. ? Leave the ice on for 20 minutes, 2-3 times a day.  Move your fingers often to avoid stiffness and to lessen swelling.  Raise (elevate) your wrist above the level of your heart while you are sitting or lying down. Activity  Do not drive until your health care provider approves.  Do not drive or use heavy machinery while  taking prescription pain medicine.  Return to your normal activities as told by your health care provider. Avoid activities that cause pain.  If physical therapy was prescribed, do exercises as told by your therapist. Physical therapy can help you heal faster and regain movement. General instructions  Take over-the-counter and prescription medicines only as told by your health care provider.  If you are taking prescription pain medicine, take actions to prevent or treat constipation. Your health care provider may recommend that you: ? Drink enough fluid to keep your urine pale yellow. ? Eat foods that are high in fiber, such as fresh fruits and vegetables, whole grains, and beans. ? Limit foods that are high in fat and processed sugars, such as fried or sweet foods. ? Take an over-the-counter or prescription medicine for constipation.  Do not use any products that contain nicotine or tobacco, such as cigarettes and e-cigarettes. If you need help quitting, ask your health care provider.  Keep all follow-up visits as told by your health care provider and physical therapist. This is important. Contact a health care provider if:  You have redness or swelling around your incision.  You have fluid or blood coming from your incision.  Your incision feels warm to the touch.  You have pus or a bad smell coming from your incision.  You have a fever.  You have chills.  You have pain that does not get better with medicine.  Your carpal tunnel symptoms do not go away after 2 months.  Your carpal tunnel symptoms go away and then come back. Get help right away if:  You have pain or numbness that is getting worse.  Your fingers or fingertips become very pale or bluish in color.  You are not able to move your fingers. Summary  It is common to have wrist stiffness and bruising after a carpal tunnel release.  Icing and raising (elevating) your wrist may help to lessen swelling and pain.   Call your health care provider if you have a fever or notice any signs of infection in your incision area. This information is not intended to replace advice given to you by your health care provider. Make sure you discuss any questions you have with your health care provider. Document Revised: 08/12/2017 Document Reviewed: 05/09/2017 Elsevier Patient Education  2020 [place] After These instructions provide you with information about caring for yourself after your procedure. Your health care provider may also give you more specific instructions. Your treatment has been planned according to current medical practices, but problems sometimes occur. Call your health care provider if you have any problems or questions after your  procedure. What can I expect after the procedure? After your procedure, you may:  Feel sleepy for several hours.  Feel clumsy and have poor balance for several hours.  Feel forgetful about what happened after the procedure.  Have poor judgment for several hours.  Feel nauseous or vomit.  Have a sore throat if you had a breathing tube during the procedure. Follow these instructions at home: For at least 24 hours after the procedure:      Have a responsible adult stay with you. It is important to have someone help care for you until you are awake and alert.  Rest as needed.  Do not: ? Participate in activities in which you could fall or become injured. ? Drive. ? Use heavy machinery. ? Drink alcohol. ? Take sleeping pills or medicines that cause drowsiness. ? Make important decisions or sign legal documents. ? Take care of children on your own. Eating and drinking  Follow the diet that is recommended by your health care provider.  If you vomit, drink water, juice, or soup when you can drink without vomiting.  Make sure you have little or no nausea before eating solid foods. General instructions  Take  over-the-counter and prescription medicines only as told by your health care provider.  If you have sleep apnea, surgery and certain medicines can increase your risk for breathing problems. Follow instructions from your health care provider about wearing your sleep device: ? Anytime you are sleeping, including during daytime naps. ? While taking prescription pain medicines, sleeping medicines, or medicines that make you drowsy.  If you smoke, do not smoke without supervision.  Keep all follow-up visits as told by your health care provider. This is important. Contact a health care provider if:  You keep feeling nauseous or you keep vomiting.  You feel light-headed.  You develop a rash.  You have a fever. Get help right away if:  You have trouble breathing. Summary  For several hours after your procedure, you may feel sleepy and have poor judgment.  Have a responsible adult stay with you for at least 24 hours or until you are awake and alert. This information is not intended to replace advice given to you by your health care provider. Make sure you discuss any questions you have with your health care provider. Document Revised: 11/28/2017 Document Reviewed: 12/21/2015 Elsevier Patient Education  [place]. How to Use Chlorhexidine for Bathing Chlorhexidine gluconate (CHG) is a germ-killing (antiseptic) solution that is used to clean the skin. It can get rid of the bacteria that normally live on the skin and can keep them away for about 24 hours. To clean your skin with CHG, you may be given:  A CHG solution to use in the shower or as part of a sponge bath.  A prepackaged cloth that contains CHG. Cleaning your skin with CHG may help lower the risk for infection:  While you are staying in the intensive care unit of the hospital.  If you have a vascular access, such as a central line, to provide short-term or long-term access to your veins.  If you have a catheter to  drain urine from your bladder.  If you are on a ventilator. A ventilator is a machine that helps you breathe by moving air in and out of your lungs.  After surgery. What are the risks? Risks of using CHG include:  A skin reaction.  Hearing loss, if CHG gets in your ears.  Eye injury, if  CHG gets in your eyes and is not rinsed out.  The CHG product catching fire. Make sure that you avoid smoking and flames after applying CHG to your skin. Do not use CHG:  If you have a chlorhexidine allergy or have previously reacted to chlorhexidine.  On babies younger than 46 months of age. How to use CHG solution  Use CHG only as told by your health care provider, and follow the instructions on the label.  Use the full amount of CHG as directed. Usually, this is one bottle. During a shower Follow these steps when using CHG solution during a shower (unless your health care provider gives you different instructions): 1. Start the shower. 2. Use your normal soap and shampoo to wash your face and hair. 3. Turn off the shower or move out of the shower stream. 4. Pour the CHG onto a clean washcloth. Do not use any type of brush or rough-edged sponge. 5. Starting at your neck, lather your body down to your toes. Make sure you follow these instructions: ? If you will be having surgery, pay special attention to the part of your body where you will be having surgery. Scrub this area for at least 1 minute. ? Do not use CHG on your head or face. If the solution gets into your ears or eyes, rinse them well with water. ? Avoid your genital area. ? Avoid any areas of skin that have broken skin, cuts, or scrapes. ? Scrub your back and under your arms. Make sure to wash skin folds. 6. Let the lather sit on your skin for 1-2 minutes or as long as told by your health care provider. 7. Thoroughly rinse your entire body in the shower. Make sure that all body creases and crevices are rinsed well. 8. Dry off with a  clean towel. Do not put any substances on your body afterward-such as powder, lotion, or perfume-unless you are told to do so by your health care provider. Only use lotions that are recommended by the manufacturer. 9. Put on clean clothes or pajamas. 10. If it is the night before your surgery, sleep in clean sheets.  During a sponge bath Follow these steps when using CHG solution during a sponge bath (unless your health care provider gives you different instructions): 1. Use your normal soap and shampoo to wash your face and hair. 2. Pour the CHG onto a clean washcloth. 3. Starting at your neck, lather your body down to your toes. Make sure you follow these instructions: ? If you will be having surgery, pay special attention to the part of your body where you will be having surgery. Scrub this area for at least 1 minute. ? Do not use CHG on your head or face. If the solution gets into your ears or eyes, rinse them well with water. ? Avoid your genital area. ? Avoid any areas of skin that have broken skin, cuts, or scrapes. ? Scrub your back and under your arms. Make sure to wash skin folds. 4. Let the lather sit on your skin for 1-2 minutes or as long as told by your health care provider. 5. Using a different clean, wet washcloth, thoroughly rinse your entire body. Make sure that all body creases and crevices are rinsed well. 6. Dry off with a clean towel. Do not put any substances on your body afterward-such as powder, lotion, or perfume-unless you are told to do so by your health care provider. Only use lotions that are  recommended by the manufacturer. 7. Put on clean clothes or pajamas. 8. If it is the night before your surgery, sleep in clean sheets. How to use CHG prepackaged cloths  Only use CHG cloths as told by your health care provider, and follow the instructions on the label.  Use the CHG cloth on clean, dry skin.  Do not use the CHG cloth on your head or face unless your health  care provider tells you to.  When washing with the CHG cloth: ? Avoid your genital area. ? Avoid any areas of skin that have broken skin, cuts, or scrapes. Before surgery Follow these steps when using a CHG cloth to clean before surgery (unless your health care provider gives you different instructions): 1. Using the CHG cloth, vigorously scrub the part of your body where you will be having surgery. Scrub using a back-and-forth motion for 3 minutes. The area on your body should be completely wet with CHG when you are done scrubbing. 2. Do not rinse. Discard the cloth and let the area air-dry. Do not put any substances on the area afterward, such as powder, lotion, or perfume. 3. Put on clean clothes or pajamas. 4. If it is the night before your surgery, sleep in clean sheets.  For general bathing Follow these steps when using CHG cloths for general bathing (unless your health care provider gives you different instructions). 1. Use a separate CHG cloth for each area of your body. Make sure you wash between any folds of skin and between your fingers and toes. Wash your body in the following order, switching to a new cloth after each step: ? The front of your neck, shoulders, and chest. ? Both of your arms, under your arms, and your hands. ? Your stomach and groin area, avoiding the genitals. ? Your right leg and foot. ? Your left leg and foot. ? The back of your neck, your back, and your buttocks. 2. Do not rinse. Discard the cloth and let the area air-dry. Do not put any substances on your body afterward-such as powder, lotion, or perfume-unless you are told to do so by your health care provider. Only use lotions that are recommended by the manufacturer. 3. Put on clean clothes or pajamas. Contact a health care provider if:  Your skin gets irritated after scrubbing.  You have questions about using your solution or cloth. Get help right away if:  Your eyes become very red or swollen.   Your eyes itch badly.  Your skin itches badly and is red or swollen.  Your hearing changes.  You have trouble seeing.  You have swelling or tingling in your mouth or throat.  You have trouble breathing.  You swallow any chlorhexidine. Summary  Chlorhexidine gluconate (CHG) is a germ-killing (antiseptic) solution that is used to clean the skin. Cleaning your skin with CHG may help to lower your risk for infection.  You may be given CHG to use for bathing. It may be in a bottle or in a prepackaged cloth to use on your skin. Carefully follow your health care provider's instructions and the instructions on the product label.  Do not use CHG if you have a chlorhexidine allergy.  Contact your health care provider if your skin gets irritated after scrubbing. This information is not intended to replace advice given to you by your health care provider. Make sure you discuss any questions you have with your health care provider. Document Revised: 11/16/2018 Document Reviewed: 07/28/2017 Elsevier Patient Education  2020 [employer]  

## 2020-02-06 ENCOUNTER — Other Ambulatory Visit: Payer: Self-pay | Admitting: Orthopedic Surgery

## 2020-02-06 DIAGNOSIS — G5602 Carpal tunnel syndrome, left upper limb: Secondary | ICD-10-CM

## 2020-02-08 ENCOUNTER — Other Ambulatory Visit (HOSPITAL_COMMUNITY)
Admission: RE | Admit: 2020-02-08 | Discharge: 2020-02-08 | Disposition: A | Discharge status: Home / Self Care | Payer: PPO | Source: Ambulatory Visit | Attending: Orthopedic Surgery | Admitting: Orthopedic Surgery

## 2020-02-08 ENCOUNTER — Encounter (HOSPITAL_COMMUNITY)
Admission: RE | Admit: 2020-02-08 | Discharge: 2020-02-08 | Disposition: A | Discharge status: Home / Self Care | Payer: PPO | Source: Ambulatory Visit | Attending: Orthopedic Surgery | Admitting: Orthopedic Surgery

## 2020-02-08 ENCOUNTER — Encounter (HOSPITAL_COMMUNITY): Payer: Self-pay

## 2020-02-08 ENCOUNTER — Other Ambulatory Visit: Payer: Self-pay

## 2020-02-08 DIAGNOSIS — Z20822 Contact with and (suspected) exposure to covid-19: Secondary | ICD-10-CM | POA: Insufficient documentation

## 2020-02-08 DIAGNOSIS — Z01818 Encounter for other preprocedural examination: Secondary | ICD-10-CM | POA: Diagnosis not present

## 2020-02-08 HISTORY — DX: Polyneuropathy, unspecified: G62.9

## 2020-02-08 LAB — CBC WITH DIFFERENTIAL/PLATELET
Abs Immature Granulocytes: 0.06 10*3/uL (ref 0.00–0.07)
Basophils Absolute: 0.1 10*3/uL (ref 0.0–0.1)
Basophils Relative: 1 %
Eosinophils Absolute: 0.4 10*3/uL (ref 0.0–0.5)
Eosinophils Relative: 4 %
HCT: 40.7 % (ref 36.0–46.0)
Hemoglobin: 12.2 g/dL (ref 12.0–15.0)
Immature Granulocytes: 1 %
Lymphocytes Relative: 27 %
Lymphs Abs: 2.8 10*3/uL (ref 0.7–4.0)
MCH: 28 pg (ref 26.0–34.0)
MCHC: 30 g/dL (ref 30.0–36.0)
MCV: 93.6 fL (ref 80.0–100.0)
Monocytes Absolute: 0.8 10*3/uL (ref 0.1–1.0)
Monocytes Relative: 7 %
Neutro Abs: 6.2 10*3/uL (ref 1.7–7.7)
Neutrophils Relative %: 60 %
Platelets: 430 10*3/uL — ABNORMAL HIGH (ref 150–400)
RBC: 4.35 MIL/uL (ref 3.87–5.11)
RDW: 13.2 % (ref 11.5–15.5)
WBC: 10.3 10*3/uL (ref 4.0–10.5)
nRBC: 0 % (ref 0.0–0.2)

## 2020-02-08 LAB — SARS CORONAVIRUS 2 (TAT 6-24 HRS): SARS Coronavirus 2: NEGATIVE

## 2020-02-08 LAB — BASIC METABOLIC PANEL
Anion gap: 14 (ref 5–15)
BUN: 12 mg/dL (ref 8–23)
CO2: 23 mmol/L (ref 22–32)
Calcium: 8.5 mg/dL — ABNORMAL LOW (ref 8.9–10.3)
Chloride: 99 mmol/L (ref 98–111)
Creatinine, Ser: 1.09 mg/dL — ABNORMAL HIGH (ref 0.44–1.00)
GFR calc Af Amer: 60 mL/min — ABNORMAL LOW (ref >60)
GFR calc non Af Amer: 51 mL/min — ABNORMAL LOW (ref >60)
Glucose, Bld: 148 mg/dL — ABNORMAL HIGH (ref 70–99)
Potassium: 4.4 mmol/L (ref 3.5–5.1)
Sodium: 136 mmol/L (ref 135–145)

## 2020-02-08 LAB — HEMOGLOBIN A1C
Hgb A1c MFr Bld: 6.4 % — ABNORMAL HIGH (ref 4.8–5.6)
Mean Plasma Glucose: 136.98 mg/dL

## 2020-02-08 NOTE — Pre-Procedure Instructions (Signed)
HgbA1C routed to PCP. 

## 2020-02-12 ENCOUNTER — Ambulatory Visit (HOSPITAL_COMMUNITY): Payer: PPO | Admitting: Anesthesiology

## 2020-02-12 ENCOUNTER — Encounter (HOSPITAL_COMMUNITY)
Admission: RE | Disposition: A | Discharge status: Home / Self Care | Payer: Self-pay | Source: Home / Self Care | Attending: Orthopedic Surgery

## 2020-02-12 ENCOUNTER — Ambulatory Visit (HOSPITAL_COMMUNITY)
Admission: RE | Admit: 2020-02-12 | Discharge: 2020-02-12 | Disposition: A | Discharge status: Home / Self Care | Payer: PPO | Attending: Orthopedic Surgery | Admitting: Orthopedic Surgery

## 2020-02-12 DIAGNOSIS — J45909 Unspecified asthma, uncomplicated: Secondary | ICD-10-CM | POA: Diagnosis not present

## 2020-02-12 DIAGNOSIS — Z885 Allergy status to narcotic agent status: Secondary | ICD-10-CM | POA: Insufficient documentation

## 2020-02-12 DIAGNOSIS — G5602 Carpal tunnel syndrome, left upper limb: Secondary | ICD-10-CM

## 2020-02-12 DIAGNOSIS — E785 Hyperlipidemia, unspecified: Secondary | ICD-10-CM | POA: Diagnosis not present

## 2020-02-12 DIAGNOSIS — E1142 Type 2 diabetes mellitus with diabetic polyneuropathy: Secondary | ICD-10-CM | POA: Insufficient documentation

## 2020-02-12 DIAGNOSIS — Z888 Allergy status to other drugs, medicaments and biological substances status: Secondary | ICD-10-CM | POA: Insufficient documentation

## 2020-02-12 DIAGNOSIS — M1990 Unspecified osteoarthritis, unspecified site: Secondary | ICD-10-CM | POA: Insufficient documentation

## 2020-02-12 DIAGNOSIS — Z79899 Other long term (current) drug therapy: Secondary | ICD-10-CM | POA: Diagnosis not present

## 2020-02-12 DIAGNOSIS — Z881 Allergy status to other antibiotic agents status: Secondary | ICD-10-CM | POA: Insufficient documentation

## 2020-02-12 DIAGNOSIS — M797 Fibromyalgia: Secondary | ICD-10-CM | POA: Insufficient documentation

## 2020-02-12 DIAGNOSIS — K219 Gastro-esophageal reflux disease without esophagitis: Secondary | ICD-10-CM | POA: Insufficient documentation

## 2020-02-12 DIAGNOSIS — G4730 Sleep apnea, unspecified: Secondary | ICD-10-CM | POA: Insufficient documentation

## 2020-02-12 DIAGNOSIS — Z87891 Personal history of nicotine dependence: Secondary | ICD-10-CM | POA: Diagnosis not present

## 2020-02-12 DIAGNOSIS — F329 Major depressive disorder, single episode, unspecified: Secondary | ICD-10-CM | POA: Insufficient documentation

## 2020-02-12 DIAGNOSIS — Z882 Allergy status to sulfonamides status: Secondary | ICD-10-CM | POA: Insufficient documentation

## 2020-02-12 DIAGNOSIS — I10 Essential (primary) hypertension: Secondary | ICD-10-CM | POA: Diagnosis not present

## 2020-02-12 HISTORY — PX: CARPAL TUNNEL RELEASE: SHX101

## 2020-02-12 LAB — GLUCOSE, CAPILLARY
Glucose-Capillary: 114 mg/dL — ABNORMAL HIGH (ref 70–99)
Glucose-Capillary: 120 mg/dL — ABNORMAL HIGH (ref 70–99)

## 2020-02-12 SURGERY — CARPAL TUNNEL RELEASE
Anesthesia: Monitor Anesthesia Care | Laterality: Left

## 2020-02-12 MED ORDER — CHLORHEXIDINE GLUCONATE 0.12 % MT SOLN: 15.0000 mL | Freq: Once | OROMUCOSAL | Status: AC

## 2020-02-12 MED ORDER — ONDANSETRON HCL 4 MG/2ML IJ SOLN: 4.0000 mg | Freq: Once | INTRAMUSCULAR | Status: DC | PRN

## 2020-02-12 MED ORDER — PROPOFOL 500 MG/50ML IV EMUL
INTRAVENOUS | Status: DC | PRN
  Administered 2020-02-12: 100 ug/kg/min via INTRAVENOUS

## 2020-02-12 MED ORDER — 0.9 % SODIUM CHLORIDE (POUR BTL) OPTIME
TOPICAL | Status: DC | PRN
  Administered 2020-02-12: 1000 mL

## 2020-02-12 MED ORDER — SODIUM CHLORIDE 0.9% FLUSH
10.0000 mL | Freq: Once | INTRAVENOUS | Status: AC
  Administered 2020-02-12: 10 mL via INTRAVENOUS

## 2020-02-12 MED ORDER — LIDOCAINE HCL (PF) 1 % IJ SOLN
INTRAMUSCULAR | Status: AC
  Filled 2020-02-12: qty 30

## 2020-02-12 MED ORDER — KETOROLAC TROMETHAMINE 30 MG/ML IJ SOLN
30.0000 mg | Freq: Once | INTRAMUSCULAR | Status: AC
  Administered 2020-02-12: 30 mg via INTRAVENOUS

## 2020-02-12 MED ORDER — GLYCOPYRROLATE 0.2 MG/ML IJ SOLN
INTRAMUSCULAR | Status: AC
  Filled 2020-02-12: qty 1

## 2020-02-12 MED ORDER — OXYCODONE HCL 5 MG PO TABS
ORAL_TABLET | ORAL | Status: AC
  Filled 2020-02-12: qty 1

## 2020-02-12 MED ORDER — IBUPROFEN 800 MG PO TABS
800.0000 mg | ORAL_TABLET | Freq: Three times a day (TID) | ORAL | 1 refills | Status: DC | PRN
Start: 2020-02-12 — End: 2022-06-01

## 2020-02-12 MED ORDER — LACTATED RINGERS IV SOLN: Freq: Once | INTRAVENOUS | Status: AC

## 2020-02-12 MED ORDER — SODIUM CHLORIDE FLUSH 0.9 % IV SOLN
INTRAVENOUS | Status: AC
  Filled 2020-02-12: qty 10

## 2020-02-12 MED ORDER — GLYCOPYRROLATE 0.2 MG/ML IJ SOLN
0.2000 mg | Freq: Once | INTRAMUSCULAR | Status: AC
  Administered 2020-02-12: 0.2 mg via INTRAVENOUS

## 2020-02-12 MED ORDER — FENTANYL CITRATE (PF) 100 MCG/2ML IJ SOLN: 25.0000 ug | INTRAMUSCULAR | Status: DC | PRN

## 2020-02-12 MED ORDER — CEFAZOLIN SODIUM-DEXTROSE 2-4 GM/100ML-% IV SOLN
INTRAVENOUS | Status: AC
  Filled 2020-02-12: qty 100

## 2020-02-12 MED ORDER — KETOROLAC TROMETHAMINE 30 MG/ML IJ SOLN
INTRAMUSCULAR | Status: AC
  Filled 2020-02-12: qty 1

## 2020-02-12 MED ORDER — OXYCODONE HCL 5 MG PO TABS
5.0000 mg | ORAL_TABLET | Freq: Once | ORAL | Status: AC
  Administered 2020-02-12: 5 mg via ORAL

## 2020-02-12 MED ORDER — BUPIVACAINE HCL (PF) 0.5 % IJ SOLN
INTRAMUSCULAR | Status: AC
  Filled 2020-02-12: qty 30

## 2020-02-12 MED ORDER — METOCLOPRAMIDE HCL 5 MG/ML IJ SOLN
INTRAMUSCULAR | Status: AC
  Filled 2020-02-12: qty 2

## 2020-02-12 MED ORDER — ORAL CARE MOUTH RINSE
15.0000 mL | Freq: Once | OROMUCOSAL | Status: AC
  Administered 2020-02-12: 15 mL via OROMUCOSAL

## 2020-02-12 MED ORDER — LIDOCAINE HCL (PF) 1 % IJ SOLN
INTRAMUSCULAR | Status: DC | PRN
  Administered 2020-02-12: 10 mL

## 2020-02-12 MED ORDER — LACTATED RINGERS IV SOLN
INTRAVENOUS | Status: DC | PRN
Start: 2020-02-12 — End: 2020-02-12

## 2020-02-12 MED ORDER — MEPERIDINE HCL 50 MG/ML IJ SOLN: 6.2500 mg | INTRAMUSCULAR | Status: DC | PRN

## 2020-02-12 MED ORDER — CEFAZOLIN SODIUM-DEXTROSE 2-4 GM/100ML-% IV SOLN
2.0000 g | INTRAVENOUS | Status: AC
  Administered 2020-02-12: 2 g via INTRAVENOUS

## 2020-02-12 SURGICAL SUPPLY — 44 items
APL PRP STRL LF DISP 70% ISPRP (MISCELLANEOUS) ×1
BANDAGE ELASTIC 3 LF NS (GAUZE/BANDAGES/DRESSINGS) ×1 IMPLANT
BANDAGE ESMARK 4X12 BL STRL LF (DISPOSABLE) ×1 IMPLANT
BLADE SURG 15 STRL LF DISP TIS (BLADE) ×1 IMPLANT
BLADE SURG 15 STRL SS (BLADE) ×2
BNDG CMPR 12X4 ELC STRL LF (DISPOSABLE) ×1
BNDG CMPR MED 5X3 ELC HKLP NS (GAUZE/BANDAGES/DRESSINGS) ×1
BNDG CMPR STD VLCR NS LF 5.8X3 (GAUZE/BANDAGES/DRESSINGS) ×1
BNDG COHESIVE 4X5 TAN STRL (GAUZE/BANDAGES/DRESSINGS) ×2 IMPLANT
BNDG ELASTIC 3X5.8 VLCR NS LF (GAUZE/BANDAGES/DRESSINGS) ×2 IMPLANT
BNDG ESMARK 4X12 BLUE STRL LF (DISPOSABLE) ×2
BNDG GAUZE ELAST 4 BULKY (GAUZE/BANDAGES/DRESSINGS) ×2 IMPLANT
CHLORAPREP W/TINT 26 (MISCELLANEOUS) ×2 IMPLANT
CLOTH BEACON ORANGE TIMEOUT ST (SAFETY) ×2 IMPLANT
COVER LIGHT HANDLE STERIS (MISCELLANEOUS) ×4 IMPLANT
COVER WAND RF STERILE (DRAPES) ×2 IMPLANT
CUFF TOURN SGL QUICK 18X4 (TOURNIQUET CUFF) ×2 IMPLANT
DECANTER SPIKE VIAL GLASS SM (MISCELLANEOUS) ×2 IMPLANT
DRAPE HALF SHEET 40X57 (DRAPES) ×2 IMPLANT
DRSG XEROFORM 1X8 (GAUZE/BANDAGES/DRESSINGS) ×2 IMPLANT
ELECT NDL TIP 2.8 STRL (NEEDLE) ×1 IMPLANT
ELECT NEEDLE TIP 2.8 STRL (NEEDLE) ×2 IMPLANT
ELECT REM PT RETURN 9FT ADLT (ELECTROSURGICAL) ×2
ELECTRODE REM PT RTRN 9FT ADLT (ELECTROSURGICAL) ×1 IMPLANT
GAUZE SPONGE 4X4 12PLY STRL (GAUZE/BANDAGES/DRESSINGS) ×2 IMPLANT
GLOVE BIO SURGEON STRL SZ7 (GLOVE) ×1 IMPLANT
GLOVE BIOGEL PI IND STRL 7.0 (GLOVE) ×2 IMPLANT
GLOVE BIOGEL PI INDICATOR 7.0 (GLOVE) ×2
GLOVE SKINSENSE NS SZ8.0 LF (GLOVE) ×1
GLOVE SKINSENSE STRL SZ8.0 LF (GLOVE) ×1 IMPLANT
GLOVE SS N UNI LF 8.5 STRL (GLOVE) ×2 IMPLANT
GOWN STRL REUS W/TWL LRG LVL3 (GOWN DISPOSABLE) ×2 IMPLANT
GOWN STRL REUS W/TWL XL LVL3 (GOWN DISPOSABLE) ×2 IMPLANT
KIT TURNOVER KIT A (KITS) ×2 IMPLANT
MANIFOLD NEPTUNE II (INSTRUMENTS) ×2 IMPLANT
NDL HYPO 21X1.5 SAFETY (NEEDLE) ×1 IMPLANT
NEEDLE HYPO 21X1.5 SAFETY (NEEDLE) ×2 IMPLANT
NS IRRIG 1000ML POUR BTL (IV SOLUTION) ×2 IMPLANT
PACK BASIC LIMB (CUSTOM PROCEDURE TRAY) ×2 IMPLANT
PAD ARMBOARD 7.5X6 YLW CONV (MISCELLANEOUS) ×2 IMPLANT
POSITIONER HAND ALUMI XLG (MISCELLANEOUS) ×2 IMPLANT
SET BASIN LINEN APH (SET/KITS/TRAYS/PACK) ×2 IMPLANT
SUT ETHILON 3 0 FSL (SUTURE) ×2 IMPLANT
SYR CONTROL 10ML LL (SYRINGE) ×2 IMPLANT

## 2020-02-12 NOTE — Transfer of Care (Signed)
Immediate Anesthesia Transfer of Care Note  Patient: Pamela Hatfield  Procedure(s) Performed: CARPAL TUNNEL RELEASE (Left )  Patient Location: PACU  Anesthesia Type:General  Level of Consciousness: awake, alert , oriented and patient cooperative  Airway & Oxygen Therapy: Patient Spontanous Breathing  Post-op Assessment: Report given to RN, Post -op Vital signs reviewed and stable and Patient moving all extremities X 4  Post vital signs: Reviewed and stable  Last Vitals:  Vitals Value Taken Time  BP    Temp    Pulse    Resp    SpO2      Last Pain:  Vitals:   02/12/20 1054  TempSrc: Oral  PainSc: 6       Patients Stated Pain Goal: 7 ([id number])  Complications: No apparent anesthesia complications

## 2020-02-12 NOTE — H&P (Signed)
Chief Complaint  Patient presents with  . Carpal Tunnel      left   . Hand Problem      right middle finger / catching       69 year old female referred to [place] by [facility] neurology with a positive nerve conduction study for left carpal tunnel syndrome and axonal polyneuropathy   Patient is status post right carpal tunnel release she has triggering and catching of the right long finger with numbness and tingling fingertips left hand tingling numbness in the thumb and index finger and radial border of the long finger with a burning sensation.  Patient denies any weakness or difficulty picking up small fine objects but does complain of increased pain at night on the left side         Review of Systems  HENT: Positive for tinnitus.   Respiratory: Positive for shortness of breath.   Musculoskeletal: Positive for back pain, myalgias and neck pain.  Neurological: Positive for dizziness and tingling.  Endo/Heme/Allergies: Positive for environmental allergies. Bruises/bleeds easily.  Psychiatric/Behavioral: Positive for depression and memory loss.  All other systems reviewed and are negative.           Past Medical History:  Diagnosis Date  . Accessory spleen    . Arthritis      multiple areas of her body  . Asthma    . Cancer ([place])      located on her aorta and small intestines- on Octreodtide 10mg  IM every 28 days for this  . Depression    . Diabetes mellitus      diet controlled  . Dyspnea      today 11/09/2017, reports that her breathing is normal for her   . Dysrhythmia      hx rapid heart beat  . Fibromyalgia    . GERD (gastroesophageal reflux disease)    . History of hiatal hernia    . Hyperlipidemia    . Hypertension    . Myalgia and myositis, unspecified    . Sleep apnea      no cpap used, pt does not like, use to use CPAP but due to insurance had to return the device   . Weight loss 02/25/2011           Past Surgical History:  Procedure Laterality Date  .  ABDOMINAL HYSTERECTOMY   yrs ago    ovaries remain, done because of endometriosis  . ANTERIOR CERVICAL DECOMP/DISCECTOMY FUSION N/A 11/16/2017    Procedure: Anterior Cervical Decompression/Discectomy Fusion - Cervical four-Cervical five - Cervical five-Cervical six - Cervical six-Cervical seven;  Surgeon: Eustace Moore, MD;  Location: [facility];  Service: Neurosurgery;  Laterality: N/A;  . BACK SURGERY   yrs ago    lower back  . BREAST BIOPSY Bilateral      benign biopsies  . BREAST LUMPECTOMY        both breast  . CARDIAC CATHETERIZATION      . CARPAL TUNNEL RELEASE   2003  . CHOLECYSTECTOMY   yrs ago  . ESOPHAGEAL DILATION   06/14/2012    Procedure: ESOPHAGEAL DILATION;  Surgeon: Daneil Dolin, MD;  Location: [facility];  Service: Endoscopy;;  . EUS   07/13/2012    Procedure: UPPER ENDOSCOPIC ULTRASOUND (EUS) LINEAR;  Surgeon: Milus Banister, MD;  Location: [facility];  Service: Endoscopy;  Laterality: N/A;  . NOSE SURGERY   yrs ago  . ROTATOR CUFF REPAIR   yrs ago    left  Family History  Problem Relation Age of Onset  . Colon cancer Father    . Colon cancer Maternal Aunt    . Colon cancer Maternal Uncle      Social History         Tobacco Use  . Smoking status: Former Smoker      Packs/day: 0.50      Years: 20.00      Pack years: 10.00      Types: Cigarettes      Quit date: 09/13/1992      Years since quitting: 27.4  . Smokeless tobacco: Never Used  . Tobacco comment: quit about 25 + years ago  Substance Use Topics  . Alcohol use: No  . Drug use: No           Allergies  Allergen Reactions  . Nickel Swelling  . Sulfa Antibiotics Hives  . Clarithromycin Rash  . Codeine Itching and Other (See Comments)      Headaches  . Lamotrigine Rash  . Tape Itching      Active Medications      Current Meds  Medication Sig  . albuterol (VENTOLIN HFA) 108 (90 Base) MCG/ACT inhaler Inhale 2 puffs into the lungs every 6 (six) hours as needed for wheezing or  shortness of breath.   . cetirizine (ZYRTEC) 10 MG tablet Take 10 mg by mouth daily as needed for allergies.   . cyclobenzaprine (FLEXERIL) 10 MG tablet Take 10 mg by mouth 3 (three) times daily.  . DULoxetine (CYMBALTA) 60 MG capsule Take 60 mg by mouth daily.  . enalapril (VASOTEC) 10 MG tablet Take 10 mg by mouth daily.   . fluticasone (FLONASE) 50 MCG/ACT nasal spray Place 1-2 sprays into both nostrils daily as needed for allergies or rhinitis.  Marland Kitchen gabapentin (NEURONTIN) 400 MG capsule Take 400 mg by mouth 4 (four) times daily.  Marland Kitchen linaclotide (LINZESS) 72 MCG capsule Take 72 mcg by mouth daily before breakfast.  . Melatonin 10 MG TABS Take 10 mg by mouth at bedtime.  . metoprolol (LOPRESSOR) 50 MG tablet Take 50 mg by mouth 2 (two) times daily.  . montelukast (SINGULAIR) 10 MG tablet Take 10 mg by mouth at bedtime.   . Multiple Vitamin (MULTIVITAMIN WITH MINERALS) TABS tablet Take 1 tablet by mouth daily.  Marland Kitchen octreotide (SANDOSTATIN LAR) 10 MG injection Inject 10 mg into the muscle every 28 (twenty-eight) days.  . pantoprazole (PROTONIX) 40 MG tablet Take 40 mg by mouth at bedtime.   . polyethylene glycol powder (GLYCOLAX/MIRALAX) 17 GM/SCOOP powder Take 17 g by mouth daily as needed for mild constipation or moderate constipation.  . prochlorperazine (COMPAZINE) 5 MG tablet Take 5 mg by mouth every 6 (six) hours as needed for nausea or vomiting.  Marland Kitchen rOPINIRole (REQUIP) 0.25 MG tablet Take 0.75 mg by mouth 3 (three) times daily.   . traMADol (ULTRAM) 50 MG tablet Take 50 mg by mouth 4 (four) times daily.        BP 124/75   Pulse 87   Ht 5\' 5"  (1.651 m)   Wt 190 lb (86.2 kg)   BMI 31.62 kg/m    Physical Exam Constitutional:      General: She is not in acute distress.    Appearance: She is well-developed.  Cardiovascular:     Comments: No peripheral edema Skin:    General: Skin is warm and dry.  Neurological:     Mental Status: She is alert and oriented to person, place,  and time.       Sensory: No sensory deficit.     Coordination: Coordination normal.     Gait: Gait normal.     Deep Tendon Reflexes: Reflexes are normal and symmetric.       Ortho Exam   Right hand   There is no atrophy or skin changes in the right hand she has a previously noted carpal tunnel incision on the volar aspect of the wrist which healed with no signs of problems they are   She does have a slightly flexed long finger with pain over the A1 pulley decreased flexion and extension   Flexor and extensor tendons are intact   She does have some sensory loss in the fingertips.   On the left hand she has a positive Tinel's sign positive Phalen sign and a positive carpal tunnel compression test   No atrophy in the thenar or hyperthenar eminences full range of motion at the wrist and fingers flexor tendons intact extensor tendons intact     MEDICAL DECISION MAKING   A.      Encounter Diagnoses  Name Primary?  . Carpal tunnel syndrome, left upper limb Yes  . Trigger finger, left middle finger    . Axonal polyneuropathy        B. DATA ANALYSED:   NCS//the impression on the nerve study was severe median neuropathy across the left wrist severe carpal tunnel syndrome minimal median neuropathy across the right wrist and length dependent axonal polyneuropathy affecting small fibers and large fibers  IMAGING: Independent interpretation of images: none   Orders: none   Outside records reviewed: d/c summary from recent hospitalizations for hyponatremia  C. MANAGEMENT    We injected the right long finger for triggering   Trigger finger injection   Diagnosis tenosynovitis right long finger Procedure injection A1 pulley Medications lidocaine 1% 1 mL and Depo-Medrol 40 mg 1 mL Skin prep alcohol and ethyl chloride Verbal consent was obtained Timeout confirmed the injection site   After cleaning the skin with alcohol and anesthetizing the skin with ethyl chloride the A1 pulley was  palpated and the injection was performed without complication   She is also scheduled for a left carpal tunnel release.  We discussed the polyneuropathy in the effects of carpal tunnel release which relieves pressure but does not restore complete nerve function.   The procedure has been fully reviewed with the patient; The risks and benefits of surgery have been discussed and explained and understood. Alternative treatment has also been reviewed, questions were encouraged and answered. The postoperative plan is also been reviewed.     LEFT CTR      No orders of the defined types were placed in this encounter.         [name], MD   02/01/2020 9:48 AM

## 2020-02-12 NOTE — Interval H&P Note (Signed)
History and Physical Interval Note:  02/12/2020 11:24 AM  Pamela Hatfield  has presented today for surgery, with the diagnosis of left carpal tunnel syndrome.  The various methods of treatment have been discussed with the patient and family. After consideration of risks, benefits and other options for treatment, the patient has consented to  Procedure(s): CARPAL TUNNEL RELEASE (Left) as a surgical intervention.  The patient's history has been reviewed, patient examined, no change in status, stable for surgery.  I have reviewed the patient's chart and labs.  Questions were answered to the patient's satisfaction.     [name]

## 2020-02-12 NOTE — Brief Op Note (Signed)
02/12/2020  11:59 AM  PATIENT:  Pamela Hatfield  69 y.o. female  PRE-OPERATIVE DIAGNOSIS:  left carpal tunnel syndrome  POST-OPERATIVE DIAGNOSIS:  left carpal tunnel syndrome  Findings compression of the median nerve with hourglass appearance color slightly dull no space-occupying lesions  PROCEDURE:  Procedure(s): CARPAL TUNNEL RELEASE (Left)  SURGEON:  Surgeon(s) and Role:    Carole Civil, MD - Primary  PHYSICIAN ASSISTANT:   ASSISTANTS: none   ANESTHESIA:   IV sedation  And local 1% lidocaine 10 cc  EBL:  None    BLOOD ADMINISTERED:none  DRAINS: none   LOCAL MEDICATIONS USED:  LIDOCAINE   SPECIMEN:  No Specimen  DISPOSITION OF SPECIMEN:  N/A  COUNTS:  YES  TOURNIQUET:   Total Tourniquet Time Documented: Upper Arm (Left) - 9 minutes Total: Upper Arm (Left) - 9 minutes   DICTATION: .Viviann Spare Dictation  PLAN OF CARE: Discharge to home after PACU  PATIENT DISPOSITION:  PACU - hemodynamically stable.   Delay start of Pharmacological VTE agent (>24hrs) due to surgical blood loss or risk of bleeding: not applicable  Dictation.  Pamela Hatfield was seen in preop her identification was confirmed surgical site was confirmed and marked chart review was completed  Patient was taken to the operating room for IV sedation in the supine position after sterile prep and drape and timeout 10 cc of 1% lidocaine was injected in the subcutaneous tissue in the line of the proposed incision the limb was exsanguinated with a 4 inch Esmarch the tourniquet was elevated 250 mmHg  The incision was made in the line of the radial border of the ring finger the subcutaneous tissue was divided the palmar fascia was divided the distal aspect of the carpal tunnel was identified and the carpal tunnel was incised.  The carpal tunnel was inspected found to be free of any space-occupying lesions the color of the nerve was abnormal as was the shape  The wound was irrigated and closed with 3-0  nylon running suture  Sterile dressing was applied tourniquet was released good color came back to the fingers good capillary refill was noted  Patient was taken to the recovery room in stable condition

## 2020-02-12 NOTE — Anesthesia Postprocedure Evaluation (Signed)
Anesthesia Post Note  Patient: Pamela Hatfield  Procedure(s) Performed: CARPAL TUNNEL RELEASE (Left )  Patient location during evaluation: PACU Anesthesia Type: MAC Level of consciousness: awake, oriented, awake and alert and patient cooperative Pain management: satisfactory to patient Vital Signs Assessment: post-procedure vital signs reviewed and stable Respiratory status: spontaneous breathing, respiratory function stable and nonlabored ventilation Cardiovascular status: stable Postop Assessment: no apparent nausea or vomiting Anesthetic complications: no     Last Vitals:  Vitals:   02/12/20 1054  BP: 134/71  Pulse: 69  Resp: 20  Temp: 36.8 C  SpO2: 96%    Last Pain:  Vitals:   02/12/20 1054  TempSrc: Oral  PainSc: 6                  Clarann Helvey

## 2020-02-12 NOTE — Anesthesia Preprocedure Evaluation (Addendum)
Anesthesia Evaluation  Patient identified by MRN, date of birth, ID band Patient awake    Reviewed: Allergy & Precautions, NPO status , Patient's Chart, lab work & pertinent test results, reviewed documented beta blocker date and time   Airway Mallampati: III  TM Distance: >3 FB Neck ROM: Full   Comment: ACDF Dental  (+) Missing, Dental Advisory Given   Pulmonary shortness of breath and with exertion, asthma , sleep apnea , former smoker,    Pulmonary exam normal breath sounds clear to auscultation       Cardiovascular hypertension, Pt. on medications and Pt. on home beta blockers Normal cardiovascular exam+ dysrhythmias  Rhythm:Regular Rate:Normal     Neuro/Psych PSYCHIATRIC DISORDERS Depression  Neuromuscular disease    GI/Hepatic hiatal hernia, GERD  Medicated and Controlled,  Endo/Other  diabetes, Well Controlled, Type 2  Renal/GU      Musculoskeletal  (+) Arthritis , Fibromyalgia -, narcotic dependentACDF   Abdominal   Peds  Hematology   Anesthesia Other Findings   Reproductive/Obstetrics                             Anesthesia Physical Anesthesia Plan  ASA: III  Anesthesia Plan: MAC   Post-op Pain Management:    Induction:   PONV Risk Score and Plan: Ondansetron, Dexamethasone, Midazolam and Metaclopromide  Airway Management Planned: Nasal Cannula, Natural Airway and Simple Face Mask  Additional Equipment:   Intra-op Plan:   Post-operative Plan:   Informed Consent: I have reviewed the patients History and Physical, chart, labs and discussed the procedure including the risks, benefits and alternatives for the proposed anesthesia with the patient or authorized representative who has indicated his/her understanding and acceptance.     Dental advisory given  Plan Discussed with: CRNA  Anesthesia Plan Comments: (Possible GA with airway was explained)       Anesthesia  Quick Evaluation

## 2020-02-12 NOTE — Op Note (Signed)
02/12/2020  11:59 AM  PATIENT:  Pamela Hatfield  69 y.o. female  PRE-OPERATIVE DIAGNOSIS:  left carpal tunnel syndrome  POST-OPERATIVE DIAGNOSIS:  left carpal tunnel syndrome  Findings compression of the median nerve with hourglass appearance color slightly dull no space-occupying lesions  PROCEDURE:  Procedure(s): CARPAL TUNNEL RELEASE (Left)  SURGEON:  Surgeon(s) and Role:    Carole Civil, MD - Primary  PHYSICIAN ASSISTANT:   ASSISTANTS: none   ANESTHESIA:   IV sedation  And local 1% lidocaine 10 cc  EBL:  None    BLOOD ADMINISTERED:none  DRAINS: none   LOCAL MEDICATIONS USED:  LIDOCAINE   SPECIMEN:  No Specimen  DISPOSITION OF SPECIMEN:  N/A  COUNTS:  YES  TOURNIQUET:   Total Tourniquet Time Documented: Upper Arm (Left) - 9 minutes Total: Upper Arm (Left) - 9 minutes   DICTATION: .Viviann Spare Dictation  PLAN OF CARE: Discharge to home after PACU  PATIENT DISPOSITION:  PACU - hemodynamically stable.   Delay start of Pharmacological VTE agent (>24hrs) due to surgical blood loss or risk of bleeding: not applicable  Dictation.  [name] was seen in preop her identification was confirmed surgical site was confirmed and marked chart review was completed  Patient was taken to the operating room for IV sedation in the supine position after sterile prep and drape and timeout 10 cc of 1% lidocaine was injected in the subcutaneous tissue in the line of the proposed incision the limb was exsanguinated with a 4 inch Esmarch the tourniquet was elevated 250 mmHg  The incision was made in the line of the radial border of the ring finger the subcutaneous tissue was divided the palmar fascia was divided the distal aspect of the carpal tunnel was identified and the carpal tunnel was incised.  The carpal tunnel was inspected found to be free of any space-occupying lesions the color of the nerve was abnormal as was the shape  The wound was irrigated and closed with 3-0  nylon running suture  Sterile dressing was applied tourniquet was released good color came back to the fingers good capillary refill was noted  Patient was taken to the recovery room in stable condition

## 2020-02-22 ENCOUNTER — Ambulatory Visit: Payer: PPO | Admitting: Orthopedic Surgery

## 2020-02-26 DIAGNOSIS — D3A029 Benign carcinoid tumor of the large intestine, unspecified portion: Secondary | ICD-10-CM | POA: Diagnosis not present

## 2020-02-26 DIAGNOSIS — Z79899 Other long term (current) drug therapy: Secondary | ICD-10-CM | POA: Diagnosis not present

## 2020-02-29 ENCOUNTER — Other Ambulatory Visit: Payer: Self-pay

## 2020-02-29 ENCOUNTER — Ambulatory Visit (INDEPENDENT_AMBULATORY_CARE_PROVIDER_SITE_OTHER): Payer: PPO | Admitting: Orthopedic Surgery

## 2020-02-29 ENCOUNTER — Encounter: Payer: Self-pay | Admitting: Orthopedic Surgery

## 2020-02-29 DIAGNOSIS — Z9889 Other specified postprocedural states: Secondary | ICD-10-CM

## 2020-02-29 NOTE — Progress Notes (Signed)
Chief Complaint  Patient presents with  . Post-op Follow-up    carpal tunnel release 02/12/20 stitches removed     Postop visit #1 carpal tunnel release on 1 June has a little wound opening in the bottom of the wound  Recommend Neosporin keep it covered active range of motion her motion is good so far she still has some numbness and tingling in the long finger  Follow-up 4 weeks  Encounter Diagnosis  Name Primary?  . S/P carpal tunnel release left 02/12/20 Yes

## 2020-03-11 DIAGNOSIS — G894 Chronic pain syndrome: Secondary | ICD-10-CM | POA: Diagnosis not present

## 2020-03-11 DIAGNOSIS — G8929 Other chronic pain: Secondary | ICD-10-CM | POA: Diagnosis not present

## 2020-03-11 DIAGNOSIS — Z79899 Other long term (current) drug therapy: Secondary | ICD-10-CM | POA: Diagnosis not present

## 2020-03-11 DIAGNOSIS — M545 Low back pain: Secondary | ICD-10-CM | POA: Diagnosis not present

## 2020-03-25 DIAGNOSIS — K8689 Other specified diseases of pancreas: Secondary | ICD-10-CM | POA: Diagnosis not present

## 2020-03-25 DIAGNOSIS — G629 Polyneuropathy, unspecified: Secondary | ICD-10-CM | POA: Diagnosis not present

## 2020-03-25 DIAGNOSIS — Z87898 Personal history of other specified conditions: Secondary | ICD-10-CM | POA: Diagnosis not present

## 2020-03-25 DIAGNOSIS — N281 Cyst of kidney, acquired: Secondary | ICD-10-CM | POA: Diagnosis not present

## 2020-03-25 DIAGNOSIS — D3A029 Benign carcinoid tumor of the large intestine, unspecified portion: Secondary | ICD-10-CM | POA: Diagnosis not present

## 2020-03-25 DIAGNOSIS — R748 Abnormal levels of other serum enzymes: Secondary | ICD-10-CM | POA: Diagnosis not present

## 2020-03-25 DIAGNOSIS — K3189 Other diseases of stomach and duodenum: Secondary | ICD-10-CM | POA: Diagnosis not present

## 2020-03-26 DIAGNOSIS — I10 Essential (primary) hypertension: Secondary | ICD-10-CM | POA: Diagnosis not present

## 2020-03-26 DIAGNOSIS — E7849 Other hyperlipidemia: Secondary | ICD-10-CM | POA: Diagnosis not present

## 2020-03-26 DIAGNOSIS — G130 Paraneoplastic neuromyopathy and neuropathy: Secondary | ICD-10-CM | POA: Diagnosis not present

## 2020-03-26 DIAGNOSIS — J301 Allergic rhinitis due to pollen: Secondary | ICD-10-CM | POA: Diagnosis not present

## 2020-03-26 DIAGNOSIS — D72828 Other elevated white blood cell count: Secondary | ICD-10-CM | POA: Diagnosis not present

## 2020-03-26 DIAGNOSIS — R0602 Shortness of breath: Secondary | ICD-10-CM | POA: Diagnosis not present

## 2020-03-28 ENCOUNTER — Ambulatory Visit (INDEPENDENT_AMBULATORY_CARE_PROVIDER_SITE_OTHER): Payer: PPO | Admitting: Orthopedic Surgery

## 2020-03-28 ENCOUNTER — Encounter: Payer: Self-pay | Admitting: Orthopedic Surgery

## 2020-03-28 ENCOUNTER — Other Ambulatory Visit: Payer: Self-pay

## 2020-03-28 VITALS — BP 147/84 | HR 78 | Ht 65.0 in | Wt 191.0 lb

## 2020-03-28 DIAGNOSIS — Z9889 Other specified postprocedural states: Secondary | ICD-10-CM

## 2020-03-28 NOTE — Progress Notes (Signed)
Chief Complaint  Patient presents with  . Routine Post Op    CTR Left DOS 02/12/20    Last visit: carpal tunnel release on 1 June has a little wound opening in the bottom of the wound   Recommend Neosporin keep it covered active range of motion her motion is good so far she still has some numbness and tingling in the long finger   Follow-up 4 weeks  This visit :  No acute areas of concern.  1 patient has a cystic area proximal to the incision  She also has continued numbness in the long finger  She has regained her motion her wound is closed up nicely.  Recommend follow-up in 6 weeks  Encounter Diagnosis  Name Primary?  . S/P carpal tunnel release left 02/12/20 Yes

## 2020-04-07 DIAGNOSIS — G894 Chronic pain syndrome: Secondary | ICD-10-CM | POA: Diagnosis not present

## 2020-04-07 DIAGNOSIS — M5136 Other intervertebral disc degeneration, lumbar region: Secondary | ICD-10-CM | POA: Diagnosis not present

## 2020-04-07 DIAGNOSIS — Z79899 Other long term (current) drug therapy: Secondary | ICD-10-CM | POA: Diagnosis not present

## 2020-04-16 DIAGNOSIS — K219 Gastro-esophageal reflux disease without esophagitis: Secondary | ICD-10-CM | POA: Diagnosis not present

## 2020-04-16 DIAGNOSIS — E7849 Other hyperlipidemia: Secondary | ICD-10-CM | POA: Diagnosis not present

## 2020-04-16 DIAGNOSIS — E782 Mixed hyperlipidemia: Secondary | ICD-10-CM | POA: Diagnosis not present

## 2020-04-16 DIAGNOSIS — F339 Major depressive disorder, recurrent, unspecified: Secondary | ICD-10-CM | POA: Diagnosis not present

## 2020-04-16 DIAGNOSIS — I10 Essential (primary) hypertension: Secondary | ICD-10-CM | POA: Diagnosis not present

## 2020-04-22 DIAGNOSIS — D3A029 Benign carcinoid tumor of the large intestine, unspecified portion: Secondary | ICD-10-CM | POA: Diagnosis not present

## 2020-05-07 DIAGNOSIS — D72828 Other elevated white blood cell count: Secondary | ICD-10-CM | POA: Diagnosis not present

## 2020-05-07 DIAGNOSIS — R7301 Impaired fasting glucose: Secondary | ICD-10-CM | POA: Diagnosis not present

## 2020-05-07 DIAGNOSIS — J301 Allergic rhinitis due to pollen: Secondary | ICD-10-CM | POA: Diagnosis not present

## 2020-05-07 DIAGNOSIS — G130 Paraneoplastic neuromyopathy and neuropathy: Secondary | ICD-10-CM | POA: Diagnosis not present

## 2020-05-07 DIAGNOSIS — G4733 Obstructive sleep apnea (adult) (pediatric): Secondary | ICD-10-CM | POA: Diagnosis not present

## 2020-05-07 DIAGNOSIS — R0602 Shortness of breath: Secondary | ICD-10-CM | POA: Diagnosis not present

## 2020-05-07 DIAGNOSIS — G629 Polyneuropathy, unspecified: Secondary | ICD-10-CM | POA: Diagnosis not present

## 2020-05-07 DIAGNOSIS — R1011 Right upper quadrant pain: Secondary | ICD-10-CM | POA: Diagnosis not present

## 2020-05-07 DIAGNOSIS — D509 Iron deficiency anemia, unspecified: Secondary | ICD-10-CM | POA: Diagnosis not present

## 2020-05-07 DIAGNOSIS — F339 Major depressive disorder, recurrent, unspecified: Secondary | ICD-10-CM | POA: Diagnosis not present

## 2020-05-07 DIAGNOSIS — F419 Anxiety disorder, unspecified: Secondary | ICD-10-CM | POA: Diagnosis not present

## 2020-05-07 DIAGNOSIS — Z23 Encounter for immunization: Secondary | ICD-10-CM | POA: Diagnosis not present

## 2020-05-09 ENCOUNTER — Other Ambulatory Visit: Payer: Self-pay

## 2020-05-09 ENCOUNTER — Encounter: Payer: Self-pay | Admitting: Orthopedic Surgery

## 2020-05-09 ENCOUNTER — Ambulatory Visit (INDEPENDENT_AMBULATORY_CARE_PROVIDER_SITE_OTHER): Payer: PPO | Admitting: Orthopedic Surgery

## 2020-05-09 DIAGNOSIS — Z9889 Other specified postprocedural states: Secondary | ICD-10-CM

## 2020-05-09 NOTE — Progress Notes (Signed)
Chief Complaint  Patient presents with  . Routine Post Op    02/12/20 has knot at bottom of incision/ scar tissue feels thick     Postop global period for a left carpal tunnel release on June 1  Patient complains of continued altered sensation in the fingers and some thickness at the bottom of the scar  Her incision did heal the retinacular incision under the skin is thickened but that is expected  Color and capillary refill look good she has full range of motion good strength  Symptoms were present for several years before the surgery so I take 6 months to get relief follow-up with me in 3 months  Encounter Diagnosis  Name Primary?  . S/P carpal tunnel release left 02/12/20 Yes

## 2020-05-13 DIAGNOSIS — Z79899 Other long term (current) drug therapy: Secondary | ICD-10-CM | POA: Diagnosis not present

## 2020-05-13 DIAGNOSIS — G894 Chronic pain syndrome: Secondary | ICD-10-CM | POA: Diagnosis not present

## 2020-05-13 DIAGNOSIS — M5136 Other intervertebral disc degeneration, lumbar region: Secondary | ICD-10-CM | POA: Diagnosis not present

## 2020-05-14 DIAGNOSIS — G9009 Other idiopathic peripheral autonomic neuropathy: Secondary | ICD-10-CM | POA: Diagnosis not present

## 2020-05-14 DIAGNOSIS — I10 Essential (primary) hypertension: Secondary | ICD-10-CM | POA: Diagnosis not present

## 2020-05-14 DIAGNOSIS — Z0001 Encounter for general adult medical examination with abnormal findings: Secondary | ICD-10-CM | POA: Diagnosis not present

## 2020-05-14 DIAGNOSIS — K219 Gastro-esophageal reflux disease without esophagitis: Secondary | ICD-10-CM | POA: Diagnosis not present

## 2020-05-14 DIAGNOSIS — Z23 Encounter for immunization: Secondary | ICD-10-CM | POA: Diagnosis not present

## 2020-05-14 DIAGNOSIS — F331 Major depressive disorder, recurrent, moderate: Secondary | ICD-10-CM | POA: Diagnosis not present

## 2020-05-14 DIAGNOSIS — R7301 Impaired fasting glucose: Secondary | ICD-10-CM | POA: Diagnosis not present

## 2020-05-14 DIAGNOSIS — G4733 Obstructive sleep apnea (adult) (pediatric): Secondary | ICD-10-CM | POA: Diagnosis not present

## 2020-05-14 DIAGNOSIS — Z6830 Body mass index (BMI) 30.0-30.9, adult: Secondary | ICD-10-CM | POA: Diagnosis not present

## 2020-05-14 DIAGNOSIS — J302 Other seasonal allergic rhinitis: Secondary | ICD-10-CM | POA: Diagnosis not present

## 2020-05-14 DIAGNOSIS — E782 Mixed hyperlipidemia: Secondary | ICD-10-CM | POA: Diagnosis not present

## 2020-05-20 DIAGNOSIS — C7A00 Malignant carcinoid tumor of unspecified site: Secondary | ICD-10-CM | POA: Diagnosis not present

## 2020-05-20 DIAGNOSIS — Z791 Long term (current) use of non-steroidal anti-inflammatories (NSAID): Secondary | ICD-10-CM | POA: Diagnosis not present

## 2020-05-20 DIAGNOSIS — R748 Abnormal levels of other serum enzymes: Secondary | ICD-10-CM | POA: Diagnosis not present

## 2020-05-20 DIAGNOSIS — N281 Cyst of kidney, acquired: Secondary | ICD-10-CM | POA: Diagnosis not present

## 2020-05-20 DIAGNOSIS — D3A029 Benign carcinoid tumor of the large intestine, unspecified portion: Secondary | ICD-10-CM | POA: Diagnosis not present

## 2020-05-20 DIAGNOSIS — Z7951 Long term (current) use of inhaled steroids: Secondary | ICD-10-CM | POA: Diagnosis not present

## 2020-05-20 DIAGNOSIS — E1142 Type 2 diabetes mellitus with diabetic polyneuropathy: Secondary | ICD-10-CM | POA: Diagnosis not present

## 2020-05-20 DIAGNOSIS — Z7952 Long term (current) use of systemic steroids: Secondary | ICD-10-CM | POA: Diagnosis not present

## 2020-05-20 DIAGNOSIS — D372 Neoplasm of uncertain behavior of small intestine: Secondary | ICD-10-CM | POA: Diagnosis not present

## 2020-05-20 DIAGNOSIS — Z79899 Other long term (current) drug therapy: Secondary | ICD-10-CM | POA: Diagnosis not present

## 2020-05-23 DIAGNOSIS — Z87891 Personal history of nicotine dependence: Secondary | ICD-10-CM | POA: Diagnosis not present

## 2020-05-23 DIAGNOSIS — C7A00 Malignant carcinoid tumor of unspecified site: Secondary | ICD-10-CM | POA: Diagnosis not present

## 2020-05-23 DIAGNOSIS — D3A029 Benign carcinoid tumor of the large intestine, unspecified portion: Secondary | ICD-10-CM | POA: Diagnosis not present

## 2020-06-05 DIAGNOSIS — K8689 Other specified diseases of pancreas: Secondary | ICD-10-CM | POA: Diagnosis not present

## 2020-06-05 DIAGNOSIS — C7A019 Malignant carcinoid tumor of the small intestine, unspecified portion: Secondary | ICD-10-CM | POA: Diagnosis not present

## 2020-06-05 DIAGNOSIS — D3A029 Benign carcinoid tumor of the large intestine, unspecified portion: Secondary | ICD-10-CM | POA: Diagnosis not present

## 2020-06-12 DIAGNOSIS — E7849 Other hyperlipidemia: Secondary | ICD-10-CM | POA: Diagnosis not present

## 2020-06-12 DIAGNOSIS — E782 Mixed hyperlipidemia: Secondary | ICD-10-CM | POA: Diagnosis not present

## 2020-06-12 DIAGNOSIS — G8929 Other chronic pain: Secondary | ICD-10-CM | POA: Diagnosis not present

## 2020-06-12 DIAGNOSIS — F339 Major depressive disorder, recurrent, unspecified: Secondary | ICD-10-CM | POA: Diagnosis not present

## 2020-06-12 DIAGNOSIS — G894 Chronic pain syndrome: Secondary | ICD-10-CM | POA: Diagnosis not present

## 2020-06-12 DIAGNOSIS — Z79899 Other long term (current) drug therapy: Secondary | ICD-10-CM | POA: Diagnosis not present

## 2020-06-12 DIAGNOSIS — K219 Gastro-esophageal reflux disease without esophagitis: Secondary | ICD-10-CM | POA: Diagnosis not present

## 2020-06-12 DIAGNOSIS — M545 Low back pain: Secondary | ICD-10-CM | POA: Diagnosis not present

## 2020-06-12 DIAGNOSIS — I10 Essential (primary) hypertension: Secondary | ICD-10-CM | POA: Diagnosis not present

## 2020-06-19 DIAGNOSIS — D3A012 Benign carcinoid tumor of the ileum: Secondary | ICD-10-CM | POA: Diagnosis not present

## 2020-06-19 DIAGNOSIS — R739 Hyperglycemia, unspecified: Secondary | ICD-10-CM | POA: Diagnosis not present

## 2020-06-19 DIAGNOSIS — D3A029 Benign carcinoid tumor of the large intestine, unspecified portion: Secondary | ICD-10-CM | POA: Diagnosis not present

## 2020-06-19 DIAGNOSIS — K869 Disease of pancreas, unspecified: Secondary | ICD-10-CM | POA: Diagnosis not present

## 2020-06-19 DIAGNOSIS — K8689 Other specified diseases of pancreas: Secondary | ICD-10-CM | POA: Diagnosis not present

## 2020-06-19 DIAGNOSIS — Z23 Encounter for immunization: Secondary | ICD-10-CM | POA: Diagnosis not present

## 2020-06-19 DIAGNOSIS — K6389 Other specified diseases of intestine: Secondary | ICD-10-CM | POA: Diagnosis not present

## 2020-06-19 DIAGNOSIS — R1900 Intra-abdominal and pelvic swelling, mass and lump, unspecified site: Secondary | ICD-10-CM | POA: Diagnosis not present

## 2020-06-23 DIAGNOSIS — K6389 Other specified diseases of intestine: Secondary | ICD-10-CM | POA: Diagnosis not present

## 2020-06-23 DIAGNOSIS — K8689 Other specified diseases of pancreas: Secondary | ICD-10-CM | POA: Diagnosis not present

## 2020-06-23 DIAGNOSIS — D3A029 Benign carcinoid tumor of the large intestine, unspecified portion: Secondary | ICD-10-CM | POA: Diagnosis not present

## 2020-06-25 DIAGNOSIS — E7849 Other hyperlipidemia: Secondary | ICD-10-CM | POA: Diagnosis not present

## 2020-06-25 DIAGNOSIS — I10 Essential (primary) hypertension: Secondary | ICD-10-CM | POA: Diagnosis not present

## 2020-06-25 DIAGNOSIS — K219 Gastro-esophageal reflux disease without esophagitis: Secondary | ICD-10-CM | POA: Diagnosis not present

## 2020-06-25 DIAGNOSIS — E782 Mixed hyperlipidemia: Secondary | ICD-10-CM | POA: Diagnosis not present

## 2020-06-25 DIAGNOSIS — F339 Major depressive disorder, recurrent, unspecified: Secondary | ICD-10-CM | POA: Diagnosis not present

## 2020-07-15 DIAGNOSIS — D3A029 Benign carcinoid tumor of the large intestine, unspecified portion: Secondary | ICD-10-CM | POA: Diagnosis not present

## 2020-07-15 DIAGNOSIS — R519 Headache, unspecified: Secondary | ICD-10-CM | POA: Diagnosis not present

## 2020-07-21 DIAGNOSIS — G894 Chronic pain syndrome: Secondary | ICD-10-CM | POA: Diagnosis not present

## 2020-07-21 DIAGNOSIS — Z79899 Other long term (current) drug therapy: Secondary | ICD-10-CM | POA: Diagnosis not present

## 2020-07-21 DIAGNOSIS — G8929 Other chronic pain: Secondary | ICD-10-CM | POA: Diagnosis not present

## 2020-07-21 DIAGNOSIS — M545 Low back pain, unspecified: Secondary | ICD-10-CM | POA: Diagnosis not present

## 2020-07-25 ENCOUNTER — Telehealth: Payer: Self-pay | Admitting: Orthopedic Surgery

## 2020-07-25 NOTE — Telephone Encounter (Signed)
Patient called back, states she did pick up film and report from [facility]; scheduling appointment for next available.

## 2020-07-25 NOTE — Telephone Encounter (Signed)
Patient called states she fell on Sunday 07/20/20 and went to her pain management doctor on Monday 07/21/20.  She was complaining to them that she fell and her back was hurting.  They took xrays and called her yesterday to tell her she had a fracture at L -11 and she needed to go to a orthopedic doctor.  She called today stating she needs to be seen.  She does not have a brace on.   Advised her she will need to call [facility] and get a copy of her office notes and xray report and films.    Please call her back at [phone]

## 2020-07-28 DIAGNOSIS — I10 Essential (primary) hypertension: Secondary | ICD-10-CM | POA: Diagnosis not present

## 2020-07-28 DIAGNOSIS — E7849 Other hyperlipidemia: Secondary | ICD-10-CM | POA: Diagnosis not present

## 2020-07-28 DIAGNOSIS — E782 Mixed hyperlipidemia: Secondary | ICD-10-CM | POA: Diagnosis not present

## 2020-07-28 DIAGNOSIS — K219 Gastro-esophageal reflux disease without esophagitis: Secondary | ICD-10-CM | POA: Diagnosis not present

## 2020-07-28 DIAGNOSIS — F339 Major depressive disorder, recurrent, unspecified: Secondary | ICD-10-CM | POA: Diagnosis not present

## 2020-07-29 ENCOUNTER — Other Ambulatory Visit: Payer: Self-pay

## 2020-07-29 ENCOUNTER — Ambulatory Visit: Payer: PPO | Admitting: Orthopedic Surgery

## 2020-07-29 ENCOUNTER — Encounter: Payer: Self-pay | Admitting: Orthopedic Surgery

## 2020-07-29 VITALS — BP 109/66 | HR 77 | Ht 65.0 in | Wt 194.0 lb

## 2020-07-29 DIAGNOSIS — J310 Chronic rhinitis: Secondary | ICD-10-CM | POA: Diagnosis not present

## 2020-07-29 DIAGNOSIS — W19XXXA Unspecified fall, initial encounter: Secondary | ICD-10-CM | POA: Diagnosis not present

## 2020-07-29 DIAGNOSIS — J343 Hypertrophy of nasal turbinates: Secondary | ICD-10-CM | POA: Diagnosis not present

## 2020-07-29 DIAGNOSIS — S22080A Wedge compression fracture of T11-T12 vertebra, initial encounter for closed fracture: Secondary | ICD-10-CM | POA: Diagnosis not present

## 2020-07-29 DIAGNOSIS — G4733 Obstructive sleep apnea (adult) (pediatric): Secondary | ICD-10-CM | POA: Diagnosis not present

## 2020-07-29 NOTE — Progress Notes (Signed)
New Patient Visit  Assessment: Pamela Hatfield is a 69 y.o. female with the following: 1. Closed wedge compression fracture of T11 vertebra, initial encounter [facility])   Plan: Pamela Hatfield has sustained a compression fracture of the T11 vertebra with less than 25% loss of height.  This was sustained when she fell in her kitchen approximately 10 days ago.  Her pain is currently stable.  I do anticipate that this will continue to improve with time.  She is showing no signs of nerve compression.  She is seen in a pain clinic, and is taking appropriate medications including muscle relaxants, Tylenol and NSAIDs.  If her pain does not continue to improve, we may have to refer her to a neurosurgeon for further evaluation.  She will return to clinic in approximately 2 months for repeat evaluation including new x-rays.  Follow-up: Return in about 2 months (around 09/28/2020).   XR Lumbar spine next visit, include up to T11  Subjective:  Chief Complaint  Patient presents with  . Back Pain    lower back pain / fracture/ spasms back and legs since fall 1 week ago     History of Present Illness: Pamela Hatfield is a 69 y.o. female who presents for evaluation of back pain.  She states she fell in her kitchen approximately 10 days ago.  Since then, she has had primarily mid back pain.  She denies radiating symptoms in either leg.  She is seen in a pain clinic, and is currently taking multiple medications for her back.  These have improved her symptoms a little bit.  She states the pain is localized to her mid back, without radiation.   Review of Systems: No fevers or chills No numbness or tingling No chest pain No shortness of breath No bowel or bladder dysfunction No GI distress No headaches   Medical History:  Past Medical History:  Diagnosis Date  . Accessory spleen   . Arthritis    multiple areas of her body  . Asthma   . Cancer ([place])    located on her aorta and small intestines- on  Octreodtide 10mg  IM every 28 days for this  . Depression   . Diabetes mellitus    diet controlled  . Dyspnea    today 11/09/2017, reports that her breathing is normal for her   . Dysrhythmia    hx rapid heart beat  . Fibromyalgia   . GERD (gastroesophageal reflux disease)   . History of hiatal hernia   . Hyperlipidemia   . Hypertension   . Myalgia and myositis, unspecified   . Polyneuropathy   . Sleep apnea    no cpap used, pt does not like, use to use CPAP but due to insurance had to return the device   . Weight loss 02/25/2011    Past Surgical History:  Procedure Laterality Date  . ABDOMINAL HYSTERECTOMY  yrs ago   ovaries remain, done because of endometriosis  . ANTERIOR CERVICAL DECOMP/DISCECTOMY FUSION N/A 11/16/2017   Procedure: Anterior Cervical Decompression/Discectomy Fusion - Cervical four-Cervical five - Cervical five-Cervical six - Cervical six-Cervical seven;  Surgeon: Eustace Moore, MD;  Location: [place];  Service: Neurosurgery;  Laterality: N/A;  . BACK SURGERY  yrs ago   lower back  . BREAST BIOPSY Bilateral    benign biopsies  . BREAST LUMPECTOMY     both breast  . CARDIAC CATHETERIZATION    . CARPAL TUNNEL RELEASE Right 2003  . CARPAL TUNNEL RELEASE Left  02/12/2020   Procedure: CARPAL TUNNEL RELEASE;  Surgeon: Carole Civil, MD;  Location: [facility];  Service: Orthopedics;  Laterality: Left;  . CHOLECYSTECTOMY  yrs ago  . ESOPHAGEAL DILATION  06/14/2012   Procedure: ESOPHAGEAL DILATION;  Surgeon: Daneil Dolin, MD;  Location: [facility];  Service: Endoscopy;;  . EUS  07/13/2012   Procedure: UPPER ENDOSCOPIC ULTRASOUND (EUS) LINEAR;  Surgeon: Milus Banister, MD;  Location: [facility];  Service: Endoscopy;  Laterality: N/A;  . NOSE SURGERY  yrs ago  . ROTATOR CUFF REPAIR  yrs ago   left    Family History  Problem Relation Age of Onset  . Colon cancer Father   . Colon cancer Maternal Aunt   . Colon cancer Maternal Uncle    Social History    Tobacco Use  . Smoking status: Former Smoker    Packs/day: 0.50    Years: 20.00    Pack years: 10.00    Types: Cigarettes    Quit date: 09/13/1992    Years since quitting: 27.8  . Smokeless tobacco: Never Used  . Tobacco comment: quit about 25 + years ago  Vaping Use  . Vaping Use: Never used  Substance Use Topics  . Alcohol use: No  . Drug use: No    Allergies  Allergen Reactions  . Nickel Swelling  . Sulfa Antibiotics Hives  . Clarithromycin Rash  . Codeine Itching and Other (See Comments)    Headaches  . Lamotrigine Rash  . Tape Itching    Current Meds  Medication Sig  . albuterol (VENTOLIN HFA) 108 (90 Base) MCG/ACT inhaler Inhale 2 puffs into the lungs every 6 (six) hours as needed for wheezing or shortness of breath.   . cetirizine (ZYRTEC) 10 MG tablet Take 10 mg by mouth daily as needed for allergies.   . DULoxetine (CYMBALTA) 60 MG capsule Take 60 mg by mouth daily.  . enalapril (VASOTEC) 10 MG tablet Take 10 mg by mouth daily.   . fluticasone (FLONASE) 50 MCG/ACT nasal spray Place 1-2 sprays into both nostrils daily as needed for allergies or rhinitis.  Marland Kitchen gabapentin (NEURONTIN) 600 MG tablet Take 600 mg by mouth 4 (four) times daily.  Marland Kitchen HYDROcodone-acetaminophen (NORCO) 10-325 MG tablet Take 1 tablet by mouth 5 (five) times daily.  Marland Kitchen ibuprofen (ADVIL) 800 MG tablet Take 1 tablet (800 mg total) by mouth every 8 (eight) hours as needed.  . linaclotide (LINZESS) 72 MCG capsule Take 72 mcg by mouth daily before breakfast.  . Melatonin 10 MG TABS Take 10 mg by mouth at bedtime.  . metoprolol (LOPRESSOR) 50 MG tablet Take 50 mg by mouth 2 (two) times daily.  . montelukast (SINGULAIR) 10 MG tablet Take 10 mg by mouth at bedtime.   . Multiple Vitamin (MULTIVITAMIN WITH MINERALS) TABS tablet Take 1 tablet by mouth daily.  Marland Kitchen octreotide (SANDOSTATIN LAR) 10 MG injection Inject 10 mg into the muscle every 28 (twenty-eight) days.  . Oxcarbazepine (TRILEPTAL) 300 MG tablet    . pantoprazole (PROTONIX) 40 MG tablet Take 40 mg by mouth at bedtime.   . polyethylene glycol powder (GLYCOLAX/MIRALAX) 17 GM/SCOOP powder Take 17 g by mouth daily as needed for mild constipation or moderate constipation.  . prochlorperazine (COMPAZINE) 5 MG tablet Take 5 mg by mouth every 6 (six) hours as needed for nausea or vomiting.  Marland Kitchen rOPINIRole (REQUIP) 0.25 MG tablet Take 0.75 mg by mouth 3 (three) times daily.   Marland Kitchen tiZANidine (ZANAFLEX) 2 MG  tablet Take 2 mg by mouth 2 (two) times daily as needed.    Objective: BP 109/66   Pulse 77   Ht 5\' 5"  (1.651 m)   Wt 194 lb (88 kg)   BMI 32.28 kg/m   Physical Exam:  General: Alert and oriented no acute distress Gait: Slow, steady gait  Patient has some tenderness to palpation, which is primarily midline in the middle aspect of her back.  Mild tenderness to palpation in her lumbar spine.  Negative straight leg raise bilaterally.  Strength is 5/5 throughout.  She has decreased sensation to bilateral feet up to the level of her ankle, which is her baseline.  2+ patellar tendon reflex bilaterally.  1+ Achilles tendon reflex bilaterally.    IMAGING: I personally reviewed images previously obtained from the ED  X-rays were taken at an outside facility and demonstrates a compression fracture of the T11 vertebra, with mild loss of height.   New Medications:  No orders of the defined types were placed in this encounter.     Mordecai Rasmussen, MD  07/29/2020 12:38 PM

## 2020-08-06 DIAGNOSIS — Z9889 Other specified postprocedural states: Secondary | ICD-10-CM | POA: Insufficient documentation

## 2020-08-11 ENCOUNTER — Other Ambulatory Visit: Payer: Self-pay

## 2020-08-11 ENCOUNTER — Ambulatory Visit: Payer: PPO | Admitting: Orthopedic Surgery

## 2020-08-11 ENCOUNTER — Encounter: Payer: Self-pay | Admitting: Orthopedic Surgery

## 2020-08-11 VITALS — BP 138/81 | HR 77 | Ht 65.0 in | Wt 195.0 lb

## 2020-08-11 DIAGNOSIS — Z9889 Other specified postprocedural states: Secondary | ICD-10-CM

## 2020-08-11 NOTE — Progress Notes (Signed)
Chief Complaint  Patient presents with  . Wrist Pain    s/p 02/12/20, CTR. left about the same   6 MONTHS POST OP C/O thumb index and long fingertips still have sensory loss but preop symptoms of burning have resolved she has no pain in the palm although she has some swelling there  She has weak grip on the left compared to the right but full range of motion  We both agree we got about a 75% recovery which is acceptable she will follow-up as needed

## 2020-08-12 DIAGNOSIS — R748 Abnormal levels of other serum enzymes: Secondary | ICD-10-CM | POA: Diagnosis not present

## 2020-08-12 DIAGNOSIS — E1142 Type 2 diabetes mellitus with diabetic polyneuropathy: Secondary | ICD-10-CM | POA: Diagnosis not present

## 2020-08-12 DIAGNOSIS — C7A012 Malignant carcinoid tumor of the ileum: Secondary | ICD-10-CM | POA: Diagnosis not present

## 2020-08-12 DIAGNOSIS — N281 Cyst of kidney, acquired: Secondary | ICD-10-CM | POA: Diagnosis not present

## 2020-08-12 DIAGNOSIS — Z79899 Other long term (current) drug therapy: Secondary | ICD-10-CM | POA: Diagnosis not present

## 2020-08-12 DIAGNOSIS — Z7952 Long term (current) use of systemic steroids: Secondary | ICD-10-CM | POA: Diagnosis not present

## 2020-08-12 DIAGNOSIS — C7A00 Malignant carcinoid tumor of unspecified site: Secondary | ICD-10-CM | POA: Diagnosis not present

## 2020-08-12 DIAGNOSIS — Z7951 Long term (current) use of inhaled steroids: Secondary | ICD-10-CM | POA: Diagnosis not present

## 2020-08-12 DIAGNOSIS — D3A029 Benign carcinoid tumor of the large intestine, unspecified portion: Secondary | ICD-10-CM | POA: Diagnosis not present

## 2020-08-12 DIAGNOSIS — Z791 Long term (current) use of non-steroidal anti-inflammatories (NSAID): Secondary | ICD-10-CM | POA: Diagnosis not present

## 2020-08-12 DIAGNOSIS — K5900 Constipation, unspecified: Secondary | ICD-10-CM | POA: Diagnosis not present

## 2020-08-12 DIAGNOSIS — Q8909 Congenital malformations of spleen: Secondary | ICD-10-CM | POA: Diagnosis not present

## 2020-08-20 DIAGNOSIS — M545 Low back pain, unspecified: Secondary | ICD-10-CM | POA: Diagnosis not present

## 2020-08-20 DIAGNOSIS — G894 Chronic pain syndrome: Secondary | ICD-10-CM | POA: Diagnosis not present

## 2020-08-20 DIAGNOSIS — Z79899 Other long term (current) drug therapy: Secondary | ICD-10-CM | POA: Diagnosis not present

## 2020-08-20 DIAGNOSIS — G8929 Other chronic pain: Secondary | ICD-10-CM | POA: Diagnosis not present

## 2020-08-21 DIAGNOSIS — N281 Cyst of kidney, acquired: Secondary | ICD-10-CM | POA: Diagnosis not present

## 2020-09-04 DIAGNOSIS — G4733 Obstructive sleep apnea (adult) (pediatric): Secondary | ICD-10-CM | POA: Diagnosis not present

## 2020-09-04 DIAGNOSIS — J019 Acute sinusitis, unspecified: Secondary | ICD-10-CM | POA: Diagnosis not present

## 2020-09-11 ENCOUNTER — Other Ambulatory Visit (HOSPITAL_BASED_OUTPATIENT_CLINIC_OR_DEPARTMENT_OTHER): Payer: Self-pay

## 2020-09-11 DIAGNOSIS — G4730 Sleep apnea, unspecified: Secondary | ICD-10-CM

## 2020-09-12 DIAGNOSIS — F339 Major depressive disorder, recurrent, unspecified: Secondary | ICD-10-CM | POA: Diagnosis not present

## 2020-09-12 DIAGNOSIS — E7849 Other hyperlipidemia: Secondary | ICD-10-CM | POA: Diagnosis not present

## 2020-09-12 DIAGNOSIS — I10 Essential (primary) hypertension: Secondary | ICD-10-CM | POA: Diagnosis not present

## 2020-09-12 DIAGNOSIS — E782 Mixed hyperlipidemia: Secondary | ICD-10-CM | POA: Diagnosis not present

## 2020-09-12 DIAGNOSIS — K219 Gastro-esophageal reflux disease without esophagitis: Secondary | ICD-10-CM | POA: Diagnosis not present

## 2020-09-15 DIAGNOSIS — G959 Disease of spinal cord, unspecified: Secondary | ICD-10-CM | POA: Diagnosis not present

## 2020-09-15 DIAGNOSIS — M4807 Spinal stenosis, lumbosacral region: Secondary | ICD-10-CM | POA: Diagnosis not present

## 2020-09-16 DIAGNOSIS — G130 Paraneoplastic neuromyopathy and neuropathy: Secondary | ICD-10-CM | POA: Diagnosis not present

## 2020-09-16 DIAGNOSIS — K869 Disease of pancreas, unspecified: Secondary | ICD-10-CM | POA: Diagnosis not present

## 2020-09-16 DIAGNOSIS — C7A012 Malignant carcinoid tumor of the ileum: Secondary | ICD-10-CM | POA: Diagnosis not present

## 2020-09-16 DIAGNOSIS — D72828 Other elevated white blood cell count: Secondary | ICD-10-CM | POA: Diagnosis not present

## 2020-09-16 DIAGNOSIS — R0602 Shortness of breath: Secondary | ICD-10-CM | POA: Diagnosis not present

## 2020-09-16 DIAGNOSIS — R7301 Impaired fasting glucose: Secondary | ICD-10-CM | POA: Diagnosis not present

## 2020-09-16 DIAGNOSIS — Z79899 Other long term (current) drug therapy: Secondary | ICD-10-CM | POA: Diagnosis not present

## 2020-09-16 DIAGNOSIS — R933 Abnormal findings on diagnostic imaging of other parts of digestive tract: Secondary | ICD-10-CM | POA: Diagnosis not present

## 2020-09-16 DIAGNOSIS — N281 Cyst of kidney, acquired: Secondary | ICD-10-CM | POA: Diagnosis not present

## 2020-09-16 DIAGNOSIS — K8689 Other specified diseases of pancreas: Secondary | ICD-10-CM | POA: Diagnosis not present

## 2020-09-16 DIAGNOSIS — F339 Major depressive disorder, recurrent, unspecified: Secondary | ICD-10-CM | POA: Diagnosis not present

## 2020-09-16 DIAGNOSIS — Z23 Encounter for immunization: Secondary | ICD-10-CM | POA: Diagnosis not present

## 2020-09-16 DIAGNOSIS — Z0001 Encounter for general adult medical examination with abnormal findings: Secondary | ICD-10-CM | POA: Diagnosis not present

## 2020-09-16 DIAGNOSIS — G4733 Obstructive sleep apnea (adult) (pediatric): Secondary | ICD-10-CM | POA: Diagnosis not present

## 2020-09-16 DIAGNOSIS — G629 Polyneuropathy, unspecified: Secondary | ICD-10-CM | POA: Diagnosis not present

## 2020-09-16 DIAGNOSIS — R748 Abnormal levels of other serum enzymes: Secondary | ICD-10-CM | POA: Diagnosis not present

## 2020-09-16 DIAGNOSIS — J301 Allergic rhinitis due to pollen: Secondary | ICD-10-CM | POA: Diagnosis not present

## 2020-09-16 DIAGNOSIS — D509 Iron deficiency anemia, unspecified: Secondary | ICD-10-CM | POA: Diagnosis not present

## 2020-09-16 DIAGNOSIS — Q8909 Congenital malformations of spleen: Secondary | ICD-10-CM | POA: Diagnosis not present

## 2020-09-16 DIAGNOSIS — F419 Anxiety disorder, unspecified: Secondary | ICD-10-CM | POA: Diagnosis not present

## 2020-09-24 DIAGNOSIS — D3A029 Benign carcinoid tumor of the large intestine, unspecified portion: Secondary | ICD-10-CM | POA: Diagnosis not present

## 2020-09-24 DIAGNOSIS — G4733 Obstructive sleep apnea (adult) (pediatric): Secondary | ICD-10-CM | POA: Diagnosis not present

## 2020-09-24 DIAGNOSIS — F331 Major depressive disorder, recurrent, moderate: Secondary | ICD-10-CM | POA: Diagnosis not present

## 2020-09-24 DIAGNOSIS — R7301 Impaired fasting glucose: Secondary | ICD-10-CM | POA: Diagnosis not present

## 2020-09-24 DIAGNOSIS — C7A029 Malignant carcinoid tumor of the large intestine, unspecified portion: Secondary | ICD-10-CM | POA: Diagnosis not present

## 2020-09-24 DIAGNOSIS — I10 Essential (primary) hypertension: Secondary | ICD-10-CM | POA: Diagnosis not present

## 2020-09-24 DIAGNOSIS — J302 Other seasonal allergic rhinitis: Secondary | ICD-10-CM | POA: Diagnosis not present

## 2020-09-24 DIAGNOSIS — R933 Abnormal findings on diagnostic imaging of other parts of digestive tract: Secondary | ICD-10-CM | POA: Diagnosis not present

## 2020-09-24 DIAGNOSIS — G9009 Other idiopathic peripheral autonomic neuropathy: Secondary | ICD-10-CM | POA: Diagnosis not present

## 2020-09-24 DIAGNOSIS — K219 Gastro-esophageal reflux disease without esophagitis: Secondary | ICD-10-CM | POA: Diagnosis not present

## 2020-09-25 DIAGNOSIS — E041 Nontoxic single thyroid nodule: Secondary | ICD-10-CM | POA: Diagnosis not present

## 2020-09-25 DIAGNOSIS — D3A029 Benign carcinoid tumor of the large intestine, unspecified portion: Secondary | ICD-10-CM | POA: Diagnosis not present

## 2020-09-25 DIAGNOSIS — K8689 Other specified diseases of pancreas: Secondary | ICD-10-CM | POA: Diagnosis not present

## 2020-09-25 DIAGNOSIS — R590 Localized enlarged lymph nodes: Secondary | ICD-10-CM | POA: Diagnosis not present

## 2020-09-25 DIAGNOSIS — K6389 Other specified diseases of intestine: Secondary | ICD-10-CM | POA: Diagnosis not present

## 2020-09-26 ENCOUNTER — Ambulatory Visit: Payer: PPO | Attending: Otolaryngology | Admitting: Neurology

## 2020-09-26 ENCOUNTER — Other Ambulatory Visit: Payer: Self-pay

## 2020-09-26 DIAGNOSIS — G4730 Sleep apnea, unspecified: Secondary | ICD-10-CM | POA: Insufficient documentation

## 2020-09-26 DIAGNOSIS — G4733 Obstructive sleep apnea (adult) (pediatric): Secondary | ICD-10-CM | POA: Diagnosis not present

## 2020-10-01 ENCOUNTER — Ambulatory Visit: Payer: PPO | Admitting: Orthopedic Surgery

## 2020-10-06 NOTE — Procedures (Signed)
[place] A. Merlene Laughter, MD     [email or web address]             HOME SLEEP TEST  LOCATION: [place]   Patient Name: Pamela Hatfield, Pamela Hatfield Date: 09/26/2020 Gender: Female D.O.B: 11-04-1950 Age (years): 11 Referring Provider: Melida Quitter Height (inches): 15 Interpreting Physician: Phillips Odor MD, ABSM Weight (lbs): 180 RPSGT: Peak, Robert BMI: 30 MRN: [id number] Neck Size: 16.00 CLINICAL INFORMATION Sleep Study Type: HST     Indication for sleep study: N/A     Epworth Sleepiness Score: 3   Most recent polysomnogram dated 02/06/2016 revealed an AHI of 37/h and RDI of 51/h. Most recent titration study dated 02/06/2016 was optimal at 12cm H2O with an AHI of 7/h. SLEEP STUDY TECHNIQUE A multi-channel overnight portable sleep study was performed. The channels recorded were: nasal airflow, thoracic respiratory movement, and oxygen saturation with a pulse oximetry. Snoring was also monitored.  MEDICATIONS Patient self administered medications include: N/A.  Current Outpatient Medications:  .  albuterol (VENTOLIN HFA) 108 (90 Base) MCG/ACT inhaler, Inhale 2 puffs into the lungs every 6 (six) hours as needed for wheezing or shortness of breath. , Disp: , Rfl:  .  cetirizine (ZYRTEC) 10 MG tablet, Take 10 mg by mouth daily as needed for allergies. , Disp: , Rfl:  .  DULoxetine (CYMBALTA) 60 MG capsule, Take 60 mg by mouth daily., Disp: , Rfl:  .  enalapril (VASOTEC) 10 MG tablet, Take 10 mg by mouth daily. , Disp: , Rfl:  .  fluticasone (FLONASE) 50 MCG/ACT nasal spray, Place 1-2 sprays into both nostrils daily as needed for allergies or rhinitis., Disp: , Rfl:  .  gabapentin (NEURONTIN) 600 MG tablet, Take 600 mg by mouth 4 (four) times daily., Disp: , Rfl:  .  HYDROcodone-acetaminophen (NORCO) 10-325 MG tablet, Take 1 tablet by mouth 5 (five) times daily., Disp: , Rfl:  .  ibuprofen (ADVIL) 800 MG tablet, Take 1 tablet (800 mg total) by mouth every 8  (eight) hours as needed., Disp: 90 tablet, Rfl: 1 .  linaclotide (LINZESS) 72 MCG capsule, Take 72 mcg by mouth daily before breakfast., Disp: , Rfl:  .  Melatonin 10 MG TABS, Take 10 mg by mouth at bedtime., Disp: , Rfl:  .  metoprolol (LOPRESSOR) 50 MG tablet, Take 50 mg by mouth 2 (two) times daily., Disp: , Rfl:  .  montelukast (SINGULAIR) 10 MG tablet, Take 10 mg by mouth at bedtime. , Disp: , Rfl:  .  Multiple Vitamin (MULTIVITAMIN WITH MINERALS) TABS tablet, Take 1 tablet by mouth daily., Disp: , Rfl:  .  octreotide (SANDOSTATIN LAR) 10 MG injection, Inject 10 mg into the muscle every 28 (twenty-eight) days., Disp: , Rfl:  .  Oxcarbazepine (TRILEPTAL) 300 MG tablet, , Disp: , Rfl:  .  pantoprazole (PROTONIX) 40 MG tablet, Take 40 mg by mouth at bedtime. , Disp: , Rfl:  .  polyethylene glycol powder (GLYCOLAX/MIRALAX) 17 GM/SCOOP powder, Take 17 g by mouth daily as needed for mild constipation or moderate constipation., Disp: , Rfl:  .  prochlorperazine (COMPAZINE) 5 MG tablet, Take 5 mg by mouth every 6 (six) hours as needed for nausea or vomiting., Disp: , Rfl:  .  rOPINIRole (REQUIP) 0.25 MG tablet, Take 0.75 mg by mouth 3 (three) times daily. , Disp: , Rfl:  .  tiZANidine (ZANAFLEX) 2 MG tablet, Take 2 mg by mouth 2 (two) times daily as needed., Disp: , Rfl:    SLEEP ARCHITECTURE  Patient was studied for 424 minutes. The sleep efficiency was 82.2 % and the patient was supine for 73%. The arousal index was 0.0 per hour.  RESPIRATORY PARAMETERS The overall AHI was 13.0 per hour, with a central apnea index of 0.1 per hour.  The oxygen nadir was 84% during sleep.     CARDIAC DATA Mean heart rate during sleep was 70.8 bpm.  IMPRESSIONS 1.  Mild to moderate obstructive sleep apnea syndrome is noted with the study. Auto PAP 8-14 is recommended.    Delano Metz, MD Diplomate, American Board of Sleep Medicine.   ELECTRONICALLY SIGNED ON:  10/06/2020, 2:36 PM [place] PH: [phone]   FX: [phone] [place]

## 2020-10-11 DIAGNOSIS — F339 Major depressive disorder, recurrent, unspecified: Secondary | ICD-10-CM | POA: Diagnosis not present

## 2020-10-11 DIAGNOSIS — I10 Essential (primary) hypertension: Secondary | ICD-10-CM | POA: Diagnosis not present

## 2020-10-11 DIAGNOSIS — E7849 Other hyperlipidemia: Secondary | ICD-10-CM | POA: Diagnosis not present

## 2020-10-11 DIAGNOSIS — K219 Gastro-esophageal reflux disease without esophagitis: Secondary | ICD-10-CM | POA: Diagnosis not present

## 2020-10-11 DIAGNOSIS — E782 Mixed hyperlipidemia: Secondary | ICD-10-CM | POA: Diagnosis not present

## 2020-10-14 DIAGNOSIS — R748 Abnormal levels of other serum enzymes: Secondary | ICD-10-CM | POA: Diagnosis not present

## 2020-10-14 DIAGNOSIS — K869 Disease of pancreas, unspecified: Secondary | ICD-10-CM | POA: Diagnosis not present

## 2020-10-14 DIAGNOSIS — N281 Cyst of kidney, acquired: Secondary | ICD-10-CM | POA: Diagnosis not present

## 2020-10-14 DIAGNOSIS — D3A098 Benign carcinoid tumors of other sites: Secondary | ICD-10-CM | POA: Diagnosis not present

## 2020-10-14 DIAGNOSIS — G629 Polyneuropathy, unspecified: Secondary | ICD-10-CM | POA: Diagnosis not present

## 2020-10-14 DIAGNOSIS — C7A012 Malignant carcinoid tumor of the ileum: Secondary | ICD-10-CM | POA: Diagnosis not present

## 2020-10-14 DIAGNOSIS — Z79899 Other long term (current) drug therapy: Secondary | ICD-10-CM | POA: Diagnosis not present

## 2020-10-14 DIAGNOSIS — D3A029 Benign carcinoid tumor of the large intestine, unspecified portion: Secondary | ICD-10-CM | POA: Diagnosis not present

## 2020-11-10 DIAGNOSIS — K219 Gastro-esophageal reflux disease without esophagitis: Secondary | ICD-10-CM | POA: Diagnosis not present

## 2020-11-10 DIAGNOSIS — G9009 Other idiopathic peripheral autonomic neuropathy: Secondary | ICD-10-CM | POA: Diagnosis not present

## 2020-11-10 DIAGNOSIS — J302 Other seasonal allergic rhinitis: Secondary | ICD-10-CM | POA: Diagnosis not present

## 2020-11-10 DIAGNOSIS — I10 Essential (primary) hypertension: Secondary | ICD-10-CM | POA: Diagnosis not present

## 2020-11-10 DIAGNOSIS — F331 Major depressive disorder, recurrent, moderate: Secondary | ICD-10-CM | POA: Diagnosis not present

## 2020-11-10 DIAGNOSIS — C7A029 Malignant carcinoid tumor of the large intestine, unspecified portion: Secondary | ICD-10-CM | POA: Diagnosis not present

## 2020-11-11 DIAGNOSIS — C7A012 Malignant carcinoid tumor of the ileum: Secondary | ICD-10-CM | POA: Diagnosis not present

## 2020-11-13 DIAGNOSIS — R600 Localized edema: Secondary | ICD-10-CM | POA: Insufficient documentation

## 2020-11-13 DIAGNOSIS — G4733 Obstructive sleep apnea (adult) (pediatric): Secondary | ICD-10-CM | POA: Diagnosis not present

## 2020-11-13 DIAGNOSIS — R609 Edema, unspecified: Secondary | ICD-10-CM | POA: Diagnosis not present

## 2020-11-13 DIAGNOSIS — J329 Chronic sinusitis, unspecified: Secondary | ICD-10-CM | POA: Insufficient documentation

## 2020-11-17 ENCOUNTER — Other Ambulatory Visit (HOSPITAL_COMMUNITY): Payer: Self-pay | Admitting: Internal Medicine

## 2020-11-17 DIAGNOSIS — Z1231 Encounter for screening mammogram for malignant neoplasm of breast: Secondary | ICD-10-CM

## 2020-11-24 ENCOUNTER — Ambulatory Visit (HOSPITAL_COMMUNITY)
Admission: RE | Admit: 2020-11-24 | Discharge: 2020-11-24 | Disposition: A | Discharge status: Home / Self Care | Payer: Medicare HMO | Source: Ambulatory Visit | Attending: Internal Medicine | Admitting: Internal Medicine

## 2020-11-24 ENCOUNTER — Other Ambulatory Visit: Payer: Self-pay

## 2020-11-24 DIAGNOSIS — Z1231 Encounter for screening mammogram for malignant neoplasm of breast: Secondary | ICD-10-CM | POA: Diagnosis not present

## 2020-11-25 DIAGNOSIS — R590 Localized enlarged lymph nodes: Secondary | ICD-10-CM | POA: Diagnosis not present

## 2020-11-25 DIAGNOSIS — M2669 Other specified disorders of temporomandibular joint: Secondary | ICD-10-CM | POA: Diagnosis not present

## 2020-11-25 DIAGNOSIS — J3489 Other specified disorders of nose and nasal sinuses: Secondary | ICD-10-CM | POA: Diagnosis not present

## 2020-11-25 DIAGNOSIS — J342 Deviated nasal septum: Secondary | ICD-10-CM | POA: Diagnosis not present

## 2020-12-02 ENCOUNTER — Other Ambulatory Visit: Payer: Self-pay | Admitting: Neurology

## 2020-12-02 ENCOUNTER — Other Ambulatory Visit (HOSPITAL_COMMUNITY): Payer: Self-pay | Admitting: Neurology

## 2020-12-02 DIAGNOSIS — M5000 Cervical disc disorder with myelopathy, unspecified cervical region: Secondary | ICD-10-CM

## 2020-12-02 DIAGNOSIS — G894 Chronic pain syndrome: Secondary | ICD-10-CM | POA: Diagnosis not present

## 2020-12-02 DIAGNOSIS — M130 Polyarthritis, unspecified: Secondary | ICD-10-CM | POA: Diagnosis not present

## 2020-12-02 DIAGNOSIS — M5416 Radiculopathy, lumbar region: Secondary | ICD-10-CM | POA: Diagnosis not present

## 2020-12-02 DIAGNOSIS — E1142 Type 2 diabetes mellitus with diabetic polyneuropathy: Secondary | ICD-10-CM | POA: Diagnosis not present

## 2020-12-02 DIAGNOSIS — G4733 Obstructive sleep apnea (adult) (pediatric): Secondary | ICD-10-CM | POA: Diagnosis not present

## 2020-12-02 DIAGNOSIS — G5603 Carpal tunnel syndrome, bilateral upper limbs: Secondary | ICD-10-CM | POA: Diagnosis not present

## 2020-12-09 DIAGNOSIS — C7A012 Malignant carcinoid tumor of the ileum: Secondary | ICD-10-CM | POA: Diagnosis not present

## 2020-12-10 DIAGNOSIS — E782 Mixed hyperlipidemia: Secondary | ICD-10-CM | POA: Diagnosis not present

## 2020-12-10 DIAGNOSIS — I10 Essential (primary) hypertension: Secondary | ICD-10-CM | POA: Diagnosis not present

## 2020-12-10 DIAGNOSIS — K219 Gastro-esophageal reflux disease without esophagitis: Secondary | ICD-10-CM | POA: Diagnosis not present

## 2020-12-10 DIAGNOSIS — E7849 Other hyperlipidemia: Secondary | ICD-10-CM | POA: Diagnosis not present

## 2020-12-10 DIAGNOSIS — Z0100 Encounter for examination of eyes and vision without abnormal findings: Secondary | ICD-10-CM | POA: Diagnosis not present

## 2020-12-10 DIAGNOSIS — E109 Type 1 diabetes mellitus without complications: Secondary | ICD-10-CM | POA: Diagnosis not present

## 2020-12-10 DIAGNOSIS — F339 Major depressive disorder, recurrent, unspecified: Secondary | ICD-10-CM | POA: Diagnosis not present

## 2020-12-17 ENCOUNTER — Ambulatory Visit (HOSPITAL_COMMUNITY): Payer: Medicare HMO

## 2020-12-17 ENCOUNTER — Encounter (HOSPITAL_COMMUNITY): Payer: Self-pay

## 2020-12-18 DIAGNOSIS — G9009 Other idiopathic peripheral autonomic neuropathy: Secondary | ICD-10-CM | POA: Diagnosis not present

## 2020-12-18 DIAGNOSIS — G4733 Obstructive sleep apnea (adult) (pediatric): Secondary | ICD-10-CM | POA: Diagnosis not present

## 2020-12-18 DIAGNOSIS — R7301 Impaired fasting glucose: Secondary | ICD-10-CM | POA: Diagnosis not present

## 2020-12-18 DIAGNOSIS — G894 Chronic pain syndrome: Secondary | ICD-10-CM | POA: Diagnosis not present

## 2020-12-18 DIAGNOSIS — C7A029 Malignant carcinoid tumor of the large intestine, unspecified portion: Secondary | ICD-10-CM | POA: Diagnosis not present

## 2020-12-18 DIAGNOSIS — F331 Major depressive disorder, recurrent, moderate: Secondary | ICD-10-CM | POA: Diagnosis not present

## 2020-12-18 DIAGNOSIS — I10 Essential (primary) hypertension: Secondary | ICD-10-CM | POA: Diagnosis not present

## 2020-12-18 DIAGNOSIS — K219 Gastro-esophageal reflux disease without esophagitis: Secondary | ICD-10-CM | POA: Diagnosis not present

## 2020-12-18 DIAGNOSIS — J302 Other seasonal allergic rhinitis: Secondary | ICD-10-CM | POA: Diagnosis not present

## 2020-12-19 DIAGNOSIS — M5451 Vertebrogenic low back pain: Secondary | ICD-10-CM | POA: Diagnosis not present

## 2020-12-19 DIAGNOSIS — M5416 Radiculopathy, lumbar region: Secondary | ICD-10-CM | POA: Diagnosis not present

## 2020-12-30 DIAGNOSIS — Z0000 Encounter for general adult medical examination without abnormal findings: Secondary | ICD-10-CM | POA: Diagnosis not present

## 2020-12-31 DIAGNOSIS — G4733 Obstructive sleep apnea (adult) (pediatric): Secondary | ICD-10-CM | POA: Diagnosis not present

## 2020-12-31 DIAGNOSIS — M130 Polyarthritis, unspecified: Secondary | ICD-10-CM | POA: Diagnosis not present

## 2020-12-31 DIAGNOSIS — E1142 Type 2 diabetes mellitus with diabetic polyneuropathy: Secondary | ICD-10-CM | POA: Diagnosis not present

## 2020-12-31 DIAGNOSIS — M5000 Cervical disc disorder with myelopathy, unspecified cervical region: Secondary | ICD-10-CM | POA: Diagnosis not present

## 2020-12-31 DIAGNOSIS — M5451 Vertebrogenic low back pain: Secondary | ICD-10-CM | POA: Diagnosis not present

## 2020-12-31 DIAGNOSIS — M5416 Radiculopathy, lumbar region: Secondary | ICD-10-CM | POA: Diagnosis not present

## 2020-12-31 DIAGNOSIS — G5603 Carpal tunnel syndrome, bilateral upper limbs: Secondary | ICD-10-CM | POA: Diagnosis not present

## 2021-01-06 DIAGNOSIS — Z79899 Other long term (current) drug therapy: Secondary | ICD-10-CM | POA: Diagnosis not present

## 2021-01-06 DIAGNOSIS — K319 Disease of stomach and duodenum, unspecified: Secondary | ICD-10-CM | POA: Diagnosis not present

## 2021-01-06 DIAGNOSIS — K869 Disease of pancreas, unspecified: Secondary | ICD-10-CM | POA: Diagnosis not present

## 2021-01-06 DIAGNOSIS — G9009 Other idiopathic peripheral autonomic neuropathy: Secondary | ICD-10-CM | POA: Diagnosis not present

## 2021-01-06 DIAGNOSIS — G893 Neoplasm related pain (acute) (chronic): Secondary | ICD-10-CM | POA: Diagnosis not present

## 2021-01-06 DIAGNOSIS — N2889 Other specified disorders of kidney and ureter: Secondary | ICD-10-CM | POA: Diagnosis not present

## 2021-01-06 DIAGNOSIS — C7A00 Malignant carcinoid tumor of unspecified site: Secondary | ICD-10-CM | POA: Diagnosis not present

## 2021-01-06 DIAGNOSIS — C7A012 Malignant carcinoid tumor of the ileum: Secondary | ICD-10-CM | POA: Diagnosis not present

## 2021-01-06 DIAGNOSIS — Z5111 Encounter for antineoplastic chemotherapy: Secondary | ICD-10-CM | POA: Diagnosis not present

## 2021-01-06 DIAGNOSIS — N281 Cyst of kidney, acquired: Secondary | ICD-10-CM | POA: Diagnosis not present

## 2021-01-06 DIAGNOSIS — D3A098 Benign carcinoid tumors of other sites: Secondary | ICD-10-CM | POA: Diagnosis not present

## 2021-01-06 DIAGNOSIS — R748 Abnormal levels of other serum enzymes: Secondary | ICD-10-CM | POA: Diagnosis not present

## 2021-01-06 DIAGNOSIS — K3189 Other diseases of stomach and duodenum: Secondary | ICD-10-CM | POA: Diagnosis not present

## 2021-01-06 DIAGNOSIS — G629 Polyneuropathy, unspecified: Secondary | ICD-10-CM | POA: Diagnosis not present

## 2021-01-11 DIAGNOSIS — K219 Gastro-esophageal reflux disease without esophagitis: Secondary | ICD-10-CM | POA: Diagnosis not present

## 2021-01-11 DIAGNOSIS — F419 Anxiety disorder, unspecified: Secondary | ICD-10-CM | POA: Diagnosis not present

## 2021-02-03 DIAGNOSIS — K6389 Other specified diseases of intestine: Secondary | ICD-10-CM | POA: Diagnosis not present

## 2021-02-03 DIAGNOSIS — C7A012 Malignant carcinoid tumor of the ileum: Secondary | ICD-10-CM | POA: Diagnosis not present

## 2021-02-03 DIAGNOSIS — G629 Polyneuropathy, unspecified: Secondary | ICD-10-CM | POA: Diagnosis not present

## 2021-02-03 DIAGNOSIS — N281 Cyst of kidney, acquired: Secondary | ICD-10-CM | POA: Diagnosis not present

## 2021-02-03 DIAGNOSIS — Z79899 Other long term (current) drug therapy: Secondary | ICD-10-CM | POA: Diagnosis not present

## 2021-02-23 DIAGNOSIS — M5000 Cervical disc disorder with myelopathy, unspecified cervical region: Secondary | ICD-10-CM | POA: Diagnosis not present

## 2021-02-23 DIAGNOSIS — M5416 Radiculopathy, lumbar region: Secondary | ICD-10-CM | POA: Diagnosis not present

## 2021-02-23 DIAGNOSIS — G4733 Obstructive sleep apnea (adult) (pediatric): Secondary | ICD-10-CM | POA: Diagnosis not present

## 2021-02-23 DIAGNOSIS — M5451 Vertebrogenic low back pain: Secondary | ICD-10-CM | POA: Diagnosis not present

## 2021-02-23 DIAGNOSIS — G2581 Restless legs syndrome: Secondary | ICD-10-CM | POA: Insufficient documentation

## 2021-02-23 DIAGNOSIS — G253 Myoclonus: Secondary | ICD-10-CM | POA: Diagnosis not present

## 2021-02-23 DIAGNOSIS — M130 Polyarthritis, unspecified: Secondary | ICD-10-CM | POA: Diagnosis not present

## 2021-02-23 DIAGNOSIS — E1142 Type 2 diabetes mellitus with diabetic polyneuropathy: Secondary | ICD-10-CM | POA: Diagnosis not present

## 2021-02-23 DIAGNOSIS — G5603 Carpal tunnel syndrome, bilateral upper limbs: Secondary | ICD-10-CM | POA: Diagnosis not present

## 2021-02-24 DIAGNOSIS — Z9689 Presence of other specified functional implants: Secondary | ICD-10-CM | POA: Diagnosis not present

## 2021-02-24 DIAGNOSIS — S22080A Wedge compression fracture of T11-T12 vertebra, initial encounter for closed fracture: Secondary | ICD-10-CM | POA: Insufficient documentation

## 2021-02-24 DIAGNOSIS — M5416 Radiculopathy, lumbar region: Secondary | ICD-10-CM | POA: Diagnosis not present

## 2021-02-24 DIAGNOSIS — M5412 Radiculopathy, cervical region: Secondary | ICD-10-CM | POA: Diagnosis not present

## 2021-02-24 DIAGNOSIS — S22080S Wedge compression fracture of T11-T12 vertebra, sequela: Secondary | ICD-10-CM | POA: Diagnosis not present

## 2021-02-27 DIAGNOSIS — I10 Essential (primary) hypertension: Secondary | ICD-10-CM | POA: Diagnosis not present

## 2021-03-03 DIAGNOSIS — C7A012 Malignant carcinoid tumor of the ileum: Secondary | ICD-10-CM | POA: Diagnosis not present

## 2021-03-12 DIAGNOSIS — M4726 Other spondylosis with radiculopathy, lumbar region: Secondary | ICD-10-CM | POA: Diagnosis not present

## 2021-03-12 DIAGNOSIS — M5116 Intervertebral disc disorders with radiculopathy, lumbar region: Secondary | ICD-10-CM | POA: Diagnosis not present

## 2021-03-12 DIAGNOSIS — S22080S Wedge compression fracture of T11-T12 vertebra, sequela: Secondary | ICD-10-CM | POA: Diagnosis not present

## 2021-03-12 DIAGNOSIS — Z9889 Other specified postprocedural states: Secondary | ICD-10-CM | POA: Diagnosis not present

## 2021-03-27 DIAGNOSIS — R590 Localized enlarged lymph nodes: Secondary | ICD-10-CM | POA: Diagnosis not present

## 2021-03-27 DIAGNOSIS — C7A00 Malignant carcinoid tumor of unspecified site: Secondary | ICD-10-CM | POA: Diagnosis not present

## 2021-03-31 DIAGNOSIS — R748 Abnormal levels of other serum enzymes: Secondary | ICD-10-CM | POA: Diagnosis not present

## 2021-03-31 DIAGNOSIS — C7A012 Malignant carcinoid tumor of the ileum: Secondary | ICD-10-CM | POA: Diagnosis not present

## 2021-03-31 DIAGNOSIS — C7A00 Malignant carcinoid tumor of unspecified site: Secondary | ICD-10-CM | POA: Diagnosis not present

## 2021-03-31 DIAGNOSIS — D7389 Other diseases of spleen: Secondary | ICD-10-CM | POA: Diagnosis not present

## 2021-03-31 DIAGNOSIS — Z79899 Other long term (current) drug therapy: Secondary | ICD-10-CM | POA: Diagnosis not present

## 2021-03-31 DIAGNOSIS — R0600 Dyspnea, unspecified: Secondary | ICD-10-CM | POA: Diagnosis not present

## 2021-03-31 DIAGNOSIS — K3189 Other diseases of stomach and duodenum: Secondary | ICD-10-CM | POA: Diagnosis not present

## 2021-03-31 DIAGNOSIS — R197 Diarrhea, unspecified: Secondary | ICD-10-CM | POA: Diagnosis not present

## 2021-03-31 DIAGNOSIS — D3A029 Benign carcinoid tumor of the large intestine, unspecified portion: Secondary | ICD-10-CM | POA: Diagnosis not present

## 2021-03-31 DIAGNOSIS — N281 Cyst of kidney, acquired: Secondary | ICD-10-CM | POA: Diagnosis not present

## 2021-03-31 DIAGNOSIS — G629 Polyneuropathy, unspecified: Secondary | ICD-10-CM | POA: Diagnosis not present

## 2021-03-31 DIAGNOSIS — R52 Pain, unspecified: Secondary | ICD-10-CM | POA: Diagnosis not present

## 2021-03-31 DIAGNOSIS — R6881 Early satiety: Secondary | ICD-10-CM | POA: Diagnosis not present

## 2021-04-28 DIAGNOSIS — C7A012 Malignant carcinoid tumor of the ileum: Secondary | ICD-10-CM | POA: Diagnosis not present

## 2021-05-04 DIAGNOSIS — M5416 Radiculopathy, lumbar region: Secondary | ICD-10-CM | POA: Diagnosis not present

## 2021-05-04 DIAGNOSIS — G2581 Restless legs syndrome: Secondary | ICD-10-CM | POA: Diagnosis not present

## 2021-05-04 DIAGNOSIS — M130 Polyarthritis, unspecified: Secondary | ICD-10-CM | POA: Diagnosis not present

## 2021-05-04 DIAGNOSIS — G253 Myoclonus: Secondary | ICD-10-CM | POA: Diagnosis not present

## 2021-05-04 DIAGNOSIS — M5000 Cervical disc disorder with myelopathy, unspecified cervical region: Secondary | ICD-10-CM | POA: Diagnosis not present

## 2021-05-04 DIAGNOSIS — G5603 Carpal tunnel syndrome, bilateral upper limbs: Secondary | ICD-10-CM | POA: Diagnosis not present

## 2021-05-04 DIAGNOSIS — G894 Chronic pain syndrome: Secondary | ICD-10-CM | POA: Diagnosis not present

## 2021-05-04 DIAGNOSIS — E1142 Type 2 diabetes mellitus with diabetic polyneuropathy: Secondary | ICD-10-CM | POA: Diagnosis not present

## 2021-05-04 DIAGNOSIS — M5451 Vertebrogenic low back pain: Secondary | ICD-10-CM | POA: Diagnosis not present

## 2021-05-04 DIAGNOSIS — G4733 Obstructive sleep apnea (adult) (pediatric): Secondary | ICD-10-CM | POA: Diagnosis not present

## 2021-05-05 DIAGNOSIS — M5136 Other intervertebral disc degeneration, lumbar region: Secondary | ICD-10-CM | POA: Diagnosis not present

## 2021-05-05 DIAGNOSIS — M160 Bilateral primary osteoarthritis of hip: Secondary | ICD-10-CM | POA: Diagnosis not present

## 2021-05-05 DIAGNOSIS — M542 Cervicalgia: Secondary | ICD-10-CM | POA: Diagnosis not present

## 2021-05-05 DIAGNOSIS — M47816 Spondylosis without myelopathy or radiculopathy, lumbar region: Secondary | ICD-10-CM | POA: Diagnosis not present

## 2021-05-05 DIAGNOSIS — M40204 Unspecified kyphosis, thoracic region: Secondary | ICD-10-CM | POA: Diagnosis not present

## 2021-05-05 DIAGNOSIS — M438X6 Other specified deforming dorsopathies, lumbar region: Secondary | ICD-10-CM | POA: Diagnosis not present

## 2021-05-05 DIAGNOSIS — Z882 Allergy status to sulfonamides status: Secondary | ICD-10-CM | POA: Diagnosis not present

## 2021-05-05 DIAGNOSIS — S22080G Wedge compression fracture of T11-T12 vertebra, subsequent encounter for fracture with delayed healing: Secondary | ICD-10-CM | POA: Diagnosis not present

## 2021-05-05 DIAGNOSIS — M438X4 Other specified deforming dorsopathies, thoracic region: Secondary | ICD-10-CM | POA: Diagnosis not present

## 2021-05-05 DIAGNOSIS — S22088D Other fracture of T11-T12 vertebra, subsequent encounter for fracture with routine healing: Secondary | ICD-10-CM | POA: Diagnosis not present

## 2021-05-05 DIAGNOSIS — M461 Sacroiliitis, not elsewhere classified: Secondary | ICD-10-CM | POA: Diagnosis not present

## 2021-05-13 DIAGNOSIS — F419 Anxiety disorder, unspecified: Secondary | ICD-10-CM | POA: Diagnosis not present

## 2021-05-13 DIAGNOSIS — K219 Gastro-esophageal reflux disease without esophagitis: Secondary | ICD-10-CM | POA: Diagnosis not present

## 2021-05-26 DIAGNOSIS — C7A012 Malignant carcinoid tumor of the ileum: Secondary | ICD-10-CM | POA: Diagnosis not present

## 2021-05-28 DIAGNOSIS — M25511 Pain in right shoulder: Secondary | ICD-10-CM | POA: Diagnosis not present

## 2021-05-28 DIAGNOSIS — G8929 Other chronic pain: Secondary | ICD-10-CM | POA: Diagnosis not present

## 2021-06-02 DIAGNOSIS — M542 Cervicalgia: Secondary | ICD-10-CM | POA: Diagnosis not present

## 2021-06-10 DIAGNOSIS — M25511 Pain in right shoulder: Secondary | ICD-10-CM | POA: Diagnosis not present

## 2021-06-10 DIAGNOSIS — M542 Cervicalgia: Secondary | ICD-10-CM | POA: Diagnosis not present

## 2021-06-23 DIAGNOSIS — Z23 Encounter for immunization: Secondary | ICD-10-CM | POA: Diagnosis not present

## 2021-06-23 DIAGNOSIS — D7389 Other diseases of spleen: Secondary | ICD-10-CM | POA: Diagnosis not present

## 2021-06-23 DIAGNOSIS — R748 Abnormal levels of other serum enzymes: Secondary | ICD-10-CM | POA: Diagnosis not present

## 2021-06-23 DIAGNOSIS — R918 Other nonspecific abnormal finding of lung field: Secondary | ICD-10-CM | POA: Diagnosis not present

## 2021-06-23 DIAGNOSIS — C7A00 Malignant carcinoid tumor of unspecified site: Secondary | ICD-10-CM | POA: Diagnosis not present

## 2021-06-23 DIAGNOSIS — C7A098 Malignant carcinoid tumors of other sites: Secondary | ICD-10-CM | POA: Diagnosis not present

## 2021-06-23 DIAGNOSIS — C7A012 Malignant carcinoid tumor of the ileum: Secondary | ICD-10-CM | POA: Diagnosis not present

## 2021-06-23 DIAGNOSIS — G629 Polyneuropathy, unspecified: Secondary | ICD-10-CM | POA: Diagnosis not present

## 2021-06-23 DIAGNOSIS — N281 Cyst of kidney, acquired: Secondary | ICD-10-CM | POA: Diagnosis not present

## 2021-06-23 DIAGNOSIS — Z79899 Other long term (current) drug therapy: Secondary | ICD-10-CM | POA: Diagnosis not present

## 2021-06-23 DIAGNOSIS — K3189 Other diseases of stomach and duodenum: Secondary | ICD-10-CM | POA: Diagnosis not present

## 2021-06-23 DIAGNOSIS — R52 Pain, unspecified: Secondary | ICD-10-CM | POA: Diagnosis not present

## 2021-06-26 ENCOUNTER — Emergency Department (HOSPITAL_COMMUNITY): Payer: Medicare HMO

## 2021-06-26 ENCOUNTER — Other Ambulatory Visit: Payer: Self-pay

## 2021-06-26 ENCOUNTER — Emergency Department (HOSPITAL_COMMUNITY)
Admission: EM | Admit: 2021-06-26 | Discharge: 2021-06-26 | Disposition: A | Discharge status: Home / Self Care | Payer: Medicare HMO | Attending: Emergency Medicine | Admitting: Emergency Medicine

## 2021-06-26 ENCOUNTER — Encounter (HOSPITAL_COMMUNITY): Payer: Self-pay

## 2021-06-26 DIAGNOSIS — I10 Essential (primary) hypertension: Secondary | ICD-10-CM | POA: Insufficient documentation

## 2021-06-26 DIAGNOSIS — S52551A Other extraarticular fracture of lower end of right radius, initial encounter for closed fracture: Secondary | ICD-10-CM | POA: Diagnosis not present

## 2021-06-26 DIAGNOSIS — W06XXXA Fall from bed, initial encounter: Secondary | ICD-10-CM | POA: Insufficient documentation

## 2021-06-26 DIAGNOSIS — S39012A Strain of muscle, fascia and tendon of lower back, initial encounter: Secondary | ICD-10-CM | POA: Insufficient documentation

## 2021-06-26 DIAGNOSIS — S76011A Strain of muscle, fascia and tendon of right hip, initial encounter: Secondary | ICD-10-CM | POA: Insufficient documentation

## 2021-06-26 DIAGNOSIS — S52501A Unspecified fracture of the lower end of right radius, initial encounter for closed fracture: Secondary | ICD-10-CM | POA: Diagnosis not present

## 2021-06-26 DIAGNOSIS — E119 Type 2 diabetes mellitus without complications: Secondary | ICD-10-CM | POA: Diagnosis not present

## 2021-06-26 DIAGNOSIS — M7989 Other specified soft tissue disorders: Secondary | ICD-10-CM | POA: Diagnosis not present

## 2021-06-26 DIAGNOSIS — Z87891 Personal history of nicotine dependence: Secondary | ICD-10-CM | POA: Diagnosis not present

## 2021-06-26 DIAGNOSIS — J45909 Unspecified asthma, uncomplicated: Secondary | ICD-10-CM | POA: Diagnosis not present

## 2021-06-26 DIAGNOSIS — Z85038 Personal history of other malignant neoplasm of large intestine: Secondary | ICD-10-CM | POA: Insufficient documentation

## 2021-06-26 DIAGNOSIS — M545 Low back pain, unspecified: Secondary | ICD-10-CM | POA: Diagnosis not present

## 2021-06-26 DIAGNOSIS — M79603 Pain in arm, unspecified: Secondary | ICD-10-CM

## 2021-06-26 DIAGNOSIS — M25551 Pain in right hip: Secondary | ICD-10-CM | POA: Diagnosis not present

## 2021-06-26 DIAGNOSIS — S6991XA Unspecified injury of right wrist, hand and finger(s), initial encounter: Secondary | ICD-10-CM | POA: Diagnosis present

## 2021-06-26 DIAGNOSIS — S52551D Other extraarticular fracture of lower end of right radius, subsequent encounter for closed fracture with routine healing: Secondary | ICD-10-CM | POA: Diagnosis not present

## 2021-06-26 MED ORDER — OXYCODONE-ACETAMINOPHEN 5-325 MG PO TABS
2.0000 | ORAL_TABLET | Freq: Once | ORAL | Status: AC
  Administered 2021-06-26: 2 via ORAL
  Filled 2021-06-26: qty 2

## 2021-06-26 NOTE — ED Notes (Signed)
Patient transported to X-ray 

## 2021-06-26 NOTE — ED Triage Notes (Signed)
Pt presents to ED from home for falling off bed. Pt c/o right arm pain, raised knot noted to right wrist, abrasion noted to left arm-no active bleeding. Pt able to move all fingers on right hand, pulses intact. Pt took oxycodone at 9pm last night, pt fell around 2 am this morning.

## 2021-06-26 NOTE — ED Provider Notes (Signed)
[name] EMERGENCY DEPARTMENT Provider Note   CSN: [id number] Arrival date & time: 06/26/21  0446     History Chief Complaint  Patient presents with   Lytle Michaels    Pamela Hatfield is a 70 y.o. female.  The history is provided by the patient.  Fall This is a new problem. The problem occurs constantly. The problem has been gradually worsening. Pertinent negatives include no chest pain, no abdominal pain and no headaches. Exacerbated by: movement. The symptoms are relieved by rest.  Patient presents after an accidental fall.  She reports she was getting on the bed and slipped and fell hitting her right arm and her right side.  Denies any head injury or LOC.  She reports pain in the right wrist and forearm, right lower back and right hip.  No neck pain.  No chest or abdominal pain.  She is not on anticoagulation.    Past Medical History:  Diagnosis Date   Accessory spleen    Arthritis    multiple areas of her body   Asthma    Cancer ([place])    located on her aorta and small intestines- on Octreodtide 10mg  IM every 28 days for this   Depression    Diabetes mellitus    diet controlled   Dyspnea    today 11/09/2017, reports that her breathing is normal for her    Dysrhythmia    hx rapid heart beat   Fibromyalgia    GERD (gastroesophageal reflux disease)    History of hiatal hernia    Hyperlipidemia    Hypertension    Myalgia and myositis, unspecified    Polyneuropathy    Sleep apnea    no cpap used, pt does not like, use to use CPAP but due to insurance had to return the device    Weight loss 02/25/2011    Patient Active Problem List   Diagnosis Date Noted   S/P carpal tunnel release left 02/12/20 08/06/2020   Hyponatremia 01/29/2020   Type 2 diabetes mellitus ([place]) 01/29/2020   Fibromyalgia    Seasonal allergies    Ataxic gait 12/11/2019   Bilateral carpal tunnel syndrome 12/11/2019   Dysesthesia 12/11/2019   Polyneuropathy 12/11/2019   Numbness of lower limb  06/15/2018   Paresthesia of upper limb 06/15/2018   Peripheral neuropathic pain 05/16/2018   Cervical vertebral fusion 11/16/2017   Carpal tunnel syndrome, left upper limb 10/18/2017   Dyspnea on exertion 08/12/2017   Essential hypertension 05/05/2016   Chest pain 05/05/2016   Palpitations 05/05/2016   Mesenteric mass 05/08/2013   Carcinoid tumor of abdomen 04/29/2013    Class: Acute   Malignant carcinoid tumor of other sites ([place]) 04/29/2013   Pancreatic mass 04/06/2013   Mixed hyperlipidemia 03/14/2013   Myalgia and myositis, unspecified 03/14/2013   Nonspecific (abnormal) findings on radiological and other examination of gastrointestinal tract 07/13/2012   Family history of colon cancer 05/23/2012   Weight loss 05/23/2012   Abdominal pain 05/23/2012   GERD (gastroesophageal reflux disease) 05/23/2012   Dysphagia 05/23/2012   Family history of malignant neoplasm of gastrointestinal tract 05/23/2012    Past Surgical History:  Procedure Laterality Date   ABDOMINAL HYSTERECTOMY  yrs ago   ovaries remain, done because of endometriosis   ANTERIOR CERVICAL DECOMP/DISCECTOMY FUSION N/A 11/16/2017   Procedure: Anterior Cervical Decompression/Discectomy Fusion - Cervical four-Cervical five - Cervical five-Cervical six - Cervical six-Cervical seven;  Surgeon: Eustace Moore, MD;  Location: [place];  Service: Neurosurgery;  Laterality:  N/A;   BACK SURGERY  yrs ago   lower back   BREAST BIOPSY Bilateral    benign biopsies   BREAST LUMPECTOMY     both breast   CARDIAC CATHETERIZATION     CARPAL TUNNEL RELEASE Right 2003   CARPAL TUNNEL RELEASE Left 02/12/2020   Procedure: CARPAL TUNNEL RELEASE;  Surgeon: Carole Civil, MD;  Location: [facility];  Service: Orthopedics;  Laterality: Left;   CHOLECYSTECTOMY  yrs ago   ESOPHAGEAL DILATION  06/14/2012   Procedure: ESOPHAGEAL DILATION;  Surgeon: Daneil Dolin, MD;  Location: [facility];  Service: Endoscopy;;   EUS  07/13/2012   Procedure:  UPPER ENDOSCOPIC ULTRASOUND (EUS) LINEAR;  Surgeon: Milus Banister, MD;  Location: [facility];  Service: Endoscopy;  Laterality: N/A;   NOSE SURGERY  yrs ago   ROTATOR CUFF REPAIR  yrs ago   left     OB History   No obstetric history on file.     Family History  Problem Relation Age of Onset   Colon cancer Father    Colon cancer Maternal Aunt    Colon cancer Maternal Uncle     Social History   Tobacco Use   Smoking status: Former    Packs/day: 0.50    Years: 20.00    Pack years: 10.00    Types: Cigarettes    Quit date: 09/13/1992    Years since quitting: 28.8   Smokeless tobacco: Never   Tobacco comments:    quit about 25 + years ago  Vaping Use   Vaping Use: Never used  Substance Use Topics   Alcohol use: No   Drug use: No    Home Medications Prior to Admission medications   Medication Sig Start Date End Date Taking? Authorizing Provider  albuterol (VENTOLIN HFA) 108 (90 Base) MCG/ACT inhaler Inhale 2 puffs into the lungs every 6 (six) hours as needed for wheezing or shortness of breath.  01/08/20   [provider]  cetirizine (ZYRTEC) 10 MG tablet Take 10 mg by mouth daily as needed for allergies.     [provider]  DULoxetine (CYMBALTA) 60 MG capsule Take 60 mg by mouth daily.    [provider]  enalapril (VASOTEC) 10 MG tablet Take 10 mg by mouth daily.     [provider]  fluticasone (FLONASE) 50 MCG/ACT nasal spray Place 1-2 sprays into both nostrils daily as needed for allergies or rhinitis.    [provider]  gabapentin (NEURONTIN) 600 MG tablet Take 600 mg by mouth 4 (four) times daily. 04/10/20   [provider]  HYDROcodone-acetaminophen (NORCO) 10-325 MG tablet Take 1 tablet by mouth 5 (five) times daily. 05/04/20   [provider]  ibuprofen (ADVIL) 800 MG tablet Take 1 tablet (800 mg total) by mouth every 8 (eight) hours as needed. 02/12/20   Carole Civil, MD  linaclotide [facility]) 72  MCG capsule Take 72 mcg by mouth daily before breakfast.    [provider]  Melatonin 10 MG TABS Take 10 mg by mouth at bedtime.    [provider]  metoprolol (LOPRESSOR) 50 MG tablet Take 50 mg by mouth 2 (two) times daily.    [provider]  montelukast (SINGULAIR) 10 MG tablet Take 10 mg by mouth at bedtime.     [provider]  Multiple Vitamin (MULTIVITAMIN WITH MINERALS) TABS tablet Take 1 tablet by mouth daily.    [provider]  octreotide (SANDOSTATIN LAR)  10 MG injection Inject 10 mg into the muscle every 28 (twenty-eight) days.    [provider]  Oxcarbazepine (TRILEPTAL) 300 MG tablet  01/23/20   [provider]  pantoprazole (PROTONIX) 40 MG tablet Take 40 mg by mouth at bedtime.     [provider]  polyethylene glycol powder (GLYCOLAX/MIRALAX) 17 GM/SCOOP powder Take 17 g by mouth daily as needed for mild constipation or moderate constipation.    [provider]  prochlorperazine (COMPAZINE) 5 MG tablet Take 5 mg by mouth every 6 (six) hours as needed for nausea or vomiting.    [provider]  rOPINIRole (REQUIP) 0.25 MG tablet Take 0.75 mg by mouth 3 (three) times daily.     [provider]  tiZANidine (ZANAFLEX) 2 MG tablet Take 2 mg by mouth 2 (two) times daily as needed. 04/07/20   [provider]    Allergies    Nickel, Sulfa antibiotics, Clarithromycin, Codeine, Lamotrigine, and Tape  Review of Systems   Review of Systems  Constitutional:  Negative for fever.  Cardiovascular:  Negative for chest pain.  Gastrointestinal:  Negative for abdominal pain.  Musculoskeletal:  Positive for arthralgias and back pain. Negative for neck pain.  Neurological:  Negative for headaches.  All other systems reviewed and are negative.  Physical Exam Updated Vital Signs BP 129/82   Pulse 71   Temp 98.4 F (36.9 C) (Oral)   Resp 17   Ht 1.651 m (5\' 5" )   Wt 88.5 kg   SpO2  97%   BMI 32.47 kg/m   Physical Exam CONSTITUTIONAL: Well developed/well nourished HEAD: Normocephalic/atraumatic EYES: EOMI/PERRL ENMT: Mucous membranes moist NECK: supple no meningeal signs SPINE/BACK:entire spine nontender Right lumbar paraspinal tenderness and patch of bruising noted in this region CV: S1/S2 noted, no murmurs/rubs/gallops noted LUNGS: Lungs are clear to auscultation bilaterally, no apparent distress Chest-no bruising or crepitus ABDOMEN: soft, nontender, no rebound or guarding, bowel sounds noted throughout abdomen GU:no cva tenderness NEURO: Pt is awake/alert/appropriate, moves all extremitiesx4.  No facial droop.  GCS 15 EXTREMITIES: pulses normal/equal, full ROM Tenderness and swelling noted to right wrist on the dorsal aspect.  Tenderness noted from the mid forearm distally. No right elbow or shoulder tenderness is noted Mild tenderness noted w/range of motion right hip All other extremities/joints palpated/ranged and nontender SKIN: warm, color normal PSYCH: no abnormalities of mood noted, alert and oriented to situation  ED Results / Procedures / Treatments   Labs (all labs ordered are listed, but only abnormal results are displayed) Labs Reviewed - No data to display  EKG None  Radiology DG Lumbar Spine Complete  Result Date: 06/26/2021 CLINICAL DATA:  Low back pain after a fall. EXAM: LUMBAR SPINE - COMPLETE 4+ VIEW COMPARISON:  Lumbar spine radiographs 07/21/2020. CT chest, abdomen, and pelvis 06/23/2021. FINDINGS: The comparison CT demonstrates 12 thoracic vertebrae bearing full sized ribs and 6 non rib-bearing lumbar type vertebrae, the lowest of which will be considered a lumbarized S1. Vertebral alignment is normal. There is a chronic T12 compression fracture. No acute fracture is identified. Mild lumbar spondylosis is similar to the 2021 radiographs. Mild-to-moderate facet arthrosis is noted in the lower lumbar spine. IMPRESSION: No acute  osseous abnormality identified. Electronically Signed   By: Logan Bores M.D.   On: 06/26/2021 06:25   DG Forearm Right  Result Date: 06/26/2021 CLINICAL DATA:  Fall with right arm pain EXAM: RIGHT FOREARM - 2 VIEW COMPARISON:  None. FINDINGS: Nondisplaced distal radial metaphysis  fracture.  No dislocation. IMPRESSION: Nondisplaced distal radial metaphysis fracture. Electronically Signed   By: Jorje Guild M.D.   On: 06/26/2021 06:35   DG Wrist Complete Right  Result Date: 06/26/2021 CLINICAL DATA:  Fall with right arm pain EXAM: RIGHT WRIST - COMPLETE 3+ VIEW COMPARISON:  None. FINDINGS: Transverse fracture through the distal radius without intra-articular extension. No displacement or noted impaction. Located wrist. Regional soft tissue swelling IMPRESSION: Nondisplaced distal radial metaphysis fracture. Electronically Signed   By: Jorje Guild M.D.   On: 06/26/2021 06:33   DG Hip Unilat W or Wo Pelvis 2-3 Views Right  Result Date: 06/26/2021 CLINICAL DATA:  Fall with right hip pain EXAM: DG HIP (WITH OR WITHOUT PELVIS) 3V RIGHT COMPARISON:  None. FINDINGS: There is no evidence of hip fracture or dislocation. There is no evidence of arthropathy or other focal bone abnormality. Generalized osteopenia. IMPRESSION: Negative for fracture Electronically Signed   By: Jorje Guild M.D.   On: 06/26/2021 06:34    Procedures .Ortho Injury Treatment  Date/Time: 06/26/2021 6:52 AM Performed by: Ripley Fraise, MD Authorized by: Ripley Fraise, MD   Consent:    Consent obtained:  Verbal   Consent given by:  Patient   Risks discussed:  FractureInjury location: wrist Location details: right wrist Injury type: fracture Fracture type: distal radius Pre-procedure neurovascular assessment: neurovascularly intact Pre-procedure distal perfusion: normal Pre-procedure neurological function: normal Pre-procedure range of motion: reduced Manipulation performed: no Immobilization:  splint Splint type: sugar tong Splint Applied by: ED Nurse Supplies used: Ortho-Glass Post-procedure neurovascular assessment: post-procedure neurovascularly intact Post-procedure distal perfusion: normal Post-procedure neurological function: normal Post-procedure range of motion: unchanged     Medications Ordered in ED Medications  oxyCODONE-acetaminophen (PERCOCET/ROXICET) 5-325 MG per tablet 2 tablet (2 tablets Oral Given 06/26/21 0535)    ED Course  I have reviewed the triage vital signs and the nursing notes.  Pertinent  imaging results that were available during my care of the patient were reviewed by me and considered in my medical decision making (see chart for details).    MDM Rules/Calculators/A&P                           Patient presents after accidental fall from bed.  Patient sustained a nondisplaced distal radius fracture.  No other acute traumatic injuries are noted.  No signs of any head or neck trauma.  Patient has been ambulatory. Splint was applied and she has been referred to orthopedics.  Patient already has pain medicine at home Final Clinical Impression(s) / ED Diagnoses Final diagnoses:  Arm pain  Other closed extra-articular fracture of distal end of right radius, initial encounter  Strain of lumbar region, initial encounter  Hip strain, right, initial encounter    Rx / DC Orders ED Discharge Orders     None        Ripley Fraise, MD 06/26/21 [phone]

## 2021-07-01 ENCOUNTER — Other Ambulatory Visit: Payer: Self-pay

## 2021-07-01 ENCOUNTER — Ambulatory Visit (INDEPENDENT_AMBULATORY_CARE_PROVIDER_SITE_OTHER): Payer: Medicare HMO | Admitting: Orthopedic Surgery

## 2021-07-01 ENCOUNTER — Encounter: Payer: Self-pay | Admitting: Orthopedic Surgery

## 2021-07-01 DIAGNOSIS — S52531A Colles' fracture of right radius, initial encounter for closed fracture: Secondary | ICD-10-CM

## 2021-07-01 NOTE — Progress Notes (Signed)
Chief Complaint  Patient presents with   Wrist Pain    :Nondisplaced distal radial metaphysis fracture.DOI 06/26/21 , Fall    Encounter Diagnosis  Name Primary?   Closed Colles' fracture of right radius, initial encounter Yes    70 year old female right-hand-dominant currently unemployed probably retired fell trying to get up on a high bed her hand went through the jewelry box she sustained a nondisplaced transverse fracture of the distal radius  She was placed in a splint she is here for follow-up and treatment   [place] reviewed  There is no height or weight on file to calculate BMI. There were no vitals taken for this visit.  Physical Exam  General appearance is normal  Normal pulse and perfusion of the right upper extremity  Normal sensation is noted.  She is awake alert and oriented x3 mood and affect are normal  Right wrist is swollen and tender but elbow is nontender she can move all the fingers okay.  No evidence of joint subluxation at the wrist or elbow.  Muscle tone normal.  Outside imaging shows a nondisplaced transverse fracture of the distal radius  Plan Short arm cast for 6 weeks  X-ray in 2 weeks in the cast to check position of the fracture

## 2021-07-06 ENCOUNTER — Telehealth: Payer: Self-pay | Admitting: Radiology

## 2021-07-06 ENCOUNTER — Ambulatory Visit (INDEPENDENT_AMBULATORY_CARE_PROVIDER_SITE_OTHER): Payer: Medicare HMO | Admitting: Orthopedic Surgery

## 2021-07-06 ENCOUNTER — Other Ambulatory Visit: Payer: Self-pay

## 2021-07-06 DIAGNOSIS — M546 Pain in thoracic spine: Secondary | ICD-10-CM | POA: Insufficient documentation

## 2021-07-06 DIAGNOSIS — M797 Fibromyalgia: Secondary | ICD-10-CM | POA: Insufficient documentation

## 2021-07-06 DIAGNOSIS — S52531D Colles' fracture of right radius, subsequent encounter for closed fracture with routine healing: Secondary | ICD-10-CM

## 2021-07-06 DIAGNOSIS — E1142 Type 2 diabetes mellitus with diabetic polyneuropathy: Secondary | ICD-10-CM | POA: Diagnosis not present

## 2021-07-06 DIAGNOSIS — G253 Myoclonus: Secondary | ICD-10-CM | POA: Diagnosis not present

## 2021-07-06 DIAGNOSIS — G5603 Carpal tunnel syndrome, bilateral upper limbs: Secondary | ICD-10-CM | POA: Diagnosis not present

## 2021-07-06 DIAGNOSIS — M5030 Other cervical disc degeneration, unspecified cervical region: Secondary | ICD-10-CM | POA: Insufficient documentation

## 2021-07-06 DIAGNOSIS — R252 Cramp and spasm: Secondary | ICD-10-CM | POA: Insufficient documentation

## 2021-07-06 DIAGNOSIS — G4733 Obstructive sleep apnea (adult) (pediatric): Secondary | ICD-10-CM | POA: Diagnosis not present

## 2021-07-06 DIAGNOSIS — M5126 Other intervertebral disc displacement, lumbar region: Secondary | ICD-10-CM | POA: Insufficient documentation

## 2021-07-06 DIAGNOSIS — M179 Osteoarthritis of knee, unspecified: Secondary | ICD-10-CM | POA: Insufficient documentation

## 2021-07-06 DIAGNOSIS — M5000 Cervical disc disorder with myelopathy, unspecified cervical region: Secondary | ICD-10-CM | POA: Diagnosis not present

## 2021-07-06 DIAGNOSIS — G2581 Restless legs syndrome: Secondary | ICD-10-CM | POA: Diagnosis not present

## 2021-07-06 DIAGNOSIS — M130 Polyarthritis, unspecified: Secondary | ICD-10-CM | POA: Diagnosis not present

## 2021-07-06 DIAGNOSIS — M1610 Unilateral primary osteoarthritis, unspecified hip: Secondary | ICD-10-CM | POA: Insufficient documentation

## 2021-07-06 DIAGNOSIS — M5451 Vertebrogenic low back pain: Secondary | ICD-10-CM | POA: Diagnosis not present

## 2021-07-06 DIAGNOSIS — M545 Low back pain, unspecified: Secondary | ICD-10-CM | POA: Insufficient documentation

## 2021-07-06 DIAGNOSIS — G894 Chronic pain syndrome: Secondary | ICD-10-CM | POA: Diagnosis not present

## 2021-07-06 DIAGNOSIS — C801 Malignant (primary) neoplasm, unspecified: Secondary | ICD-10-CM | POA: Insufficient documentation

## 2021-07-06 DIAGNOSIS — M6100 Myositis ossificans traumatica, unspecified site: Secondary | ICD-10-CM | POA: Insufficient documentation

## 2021-07-06 DIAGNOSIS — M5416 Radiculopathy, lumbar region: Secondary | ICD-10-CM | POA: Diagnosis not present

## 2021-07-06 NOTE — Telephone Encounter (Signed)
Cast changed in office today Patient states her wrist is painful I advised her to continue the Aleve she has been taking and discuss with pain management She states she has appointment tomorrow with pain management and will let him know (Dr Merlene Laughter)  To you FYI

## 2021-07-06 NOTE — Progress Notes (Signed)
Chief Complaint  Patient presents with   Cast check    Pt states the cast is really lose around wrist and hand. It's moving. She also said she is hurting really bad.    70 year old female has a nondisplaced fracture right distal radius.  Cast was placed 07/01/2021  Cast is loose  Cast is changed patient will keep appointment as scheduled for x-rays

## 2021-07-13 DIAGNOSIS — E785 Hyperlipidemia, unspecified: Secondary | ICD-10-CM | POA: Diagnosis not present

## 2021-07-13 DIAGNOSIS — I10 Essential (primary) hypertension: Secondary | ICD-10-CM | POA: Diagnosis not present

## 2021-07-14 DIAGNOSIS — S52531A Colles' fracture of right radius, initial encounter for closed fracture: Secondary | ICD-10-CM | POA: Insufficient documentation

## 2021-07-15 ENCOUNTER — Encounter: Payer: Self-pay | Admitting: Orthopedic Surgery

## 2021-07-15 ENCOUNTER — Ambulatory Visit (INDEPENDENT_AMBULATORY_CARE_PROVIDER_SITE_OTHER): Payer: Medicare HMO | Admitting: Orthopedic Surgery

## 2021-07-15 ENCOUNTER — Other Ambulatory Visit: Payer: Self-pay

## 2021-07-15 ENCOUNTER — Ambulatory Visit: Payer: Medicare HMO

## 2021-07-15 DIAGNOSIS — S52531D Colles' fracture of right radius, subsequent encounter for closed fracture with routine healing: Secondary | ICD-10-CM | POA: Diagnosis not present

## 2021-07-15 NOTE — Progress Notes (Signed)
FRACTURE CARE FOLLOW UP   FOV: 07/01/21  DOI: 10/14  GLOBAL PERIOD: [id number]  Encounter Diagnosis  Name Primary?   Closed Colles' fracture of right radius with routine healing, subsequent encounter 06/26/21 Yes    TREATMENT: [place]  70 year old female right-hand-dominant currently unemployed probably retired fell trying to get up on a high bed her hand went through the jewelry box she sustained a nondisplaced transverse fracture of the distal radius  The patient came in for cast change on the 24th  Presents today for x-rays  Third week or 19th day post injury  CAST FITTING WELL, NEURO-VASCUALR INTACT   XR OOP IN 4 WEEKS

## 2021-07-22 DIAGNOSIS — C7A012 Malignant carcinoid tumor of the ileum: Secondary | ICD-10-CM | POA: Diagnosis not present

## 2021-08-03 DIAGNOSIS — E1142 Type 2 diabetes mellitus with diabetic polyneuropathy: Secondary | ICD-10-CM | POA: Diagnosis not present

## 2021-08-03 DIAGNOSIS — G253 Myoclonus: Secondary | ICD-10-CM | POA: Diagnosis not present

## 2021-08-03 DIAGNOSIS — G2581 Restless legs syndrome: Secondary | ICD-10-CM | POA: Diagnosis not present

## 2021-08-03 DIAGNOSIS — M5000 Cervical disc disorder with myelopathy, unspecified cervical region: Secondary | ICD-10-CM | POA: Diagnosis not present

## 2021-08-03 DIAGNOSIS — G5603 Carpal tunnel syndrome, bilateral upper limbs: Secondary | ICD-10-CM | POA: Diagnosis not present

## 2021-08-03 DIAGNOSIS — M5451 Vertebrogenic low back pain: Secondary | ICD-10-CM | POA: Diagnosis not present

## 2021-08-03 DIAGNOSIS — M5416 Radiculopathy, lumbar region: Secondary | ICD-10-CM | POA: Diagnosis not present

## 2021-08-03 DIAGNOSIS — M130 Polyarthritis, unspecified: Secondary | ICD-10-CM | POA: Diagnosis not present

## 2021-08-03 DIAGNOSIS — G4733 Obstructive sleep apnea (adult) (pediatric): Secondary | ICD-10-CM | POA: Diagnosis not present

## 2021-08-10 ENCOUNTER — Encounter: Payer: Self-pay | Admitting: Orthopedic Surgery

## 2021-08-10 ENCOUNTER — Other Ambulatory Visit: Payer: Self-pay

## 2021-08-10 ENCOUNTER — Ambulatory Visit (INDEPENDENT_AMBULATORY_CARE_PROVIDER_SITE_OTHER): Payer: Medicare HMO | Admitting: Orthopedic Surgery

## 2021-08-10 ENCOUNTER — Ambulatory Visit: Payer: Medicare HMO

## 2021-08-10 DIAGNOSIS — S52531D Colles' fracture of right radius, subsequent encounter for closed fracture with routine healing: Secondary | ICD-10-CM

## 2021-08-10 NOTE — Progress Notes (Signed)
Chief Complaint  Patient presents with   Wrist Injury    06/26/21 right wrist injury     Encounter Diagnosis  Name Primary?   Closed Colles' fracture of right radius with routine healing, subsequent encounter Yes    70 year old female right wrist fractures 6 weeks from injury and casting she still has some tenderness over the distal radius  The x-ray shows that the fracture looks like it is healed no displacement  Recommend wrist splint for 4 weeks and then follow-up

## 2021-08-12 DIAGNOSIS — E785 Hyperlipidemia, unspecified: Secondary | ICD-10-CM | POA: Diagnosis not present

## 2021-08-12 DIAGNOSIS — I10 Essential (primary) hypertension: Secondary | ICD-10-CM | POA: Diagnosis not present

## 2021-08-13 ENCOUNTER — Encounter: Payer: Medicare HMO | Admitting: Orthopedic Surgery

## 2021-08-19 DIAGNOSIS — C7A012 Malignant carcinoid tumor of the ileum: Secondary | ICD-10-CM | POA: Diagnosis not present

## 2021-08-27 DIAGNOSIS — J069 Acute upper respiratory infection, unspecified: Secondary | ICD-10-CM | POA: Diagnosis not present

## 2021-09-10 ENCOUNTER — Encounter: Payer: Self-pay | Admitting: Orthopedic Surgery

## 2021-09-10 ENCOUNTER — Ambulatory Visit (INDEPENDENT_AMBULATORY_CARE_PROVIDER_SITE_OTHER): Payer: Medicare HMO | Admitting: Orthopedic Surgery

## 2021-09-10 ENCOUNTER — Other Ambulatory Visit: Payer: Self-pay

## 2021-09-10 VITALS — Ht 65.0 in | Wt 195.0 lb

## 2021-09-10 DIAGNOSIS — S52531D Colles' fracture of right radius, subsequent encounter for closed fracture with routine healing: Secondary | ICD-10-CM

## 2021-09-10 NOTE — Progress Notes (Signed)
Chief Complaint  Patient presents with   Fracture    Rt hand still having pain DOI 06/26/21    Encounter Diagnosis  Name Primary?   Closed Colles' fracture of right radius with routine healing, subsequent encounter Yes    22-month status post distal radius fracture treated with casting  Still having some pain and stiffness around the hand and wrist  X exam shows that the wrist is stiff and there is some tenderness over the wrist joint  Recommend active range of motion exercises  She can DC the splint  Follow-up as needed

## 2021-10-02 DIAGNOSIS — R7301 Impaired fasting glucose: Secondary | ICD-10-CM | POA: Diagnosis not present

## 2021-10-02 DIAGNOSIS — I10 Essential (primary) hypertension: Secondary | ICD-10-CM | POA: Diagnosis not present

## 2021-10-02 DIAGNOSIS — D509 Iron deficiency anemia, unspecified: Secondary | ICD-10-CM | POA: Diagnosis not present

## 2021-10-06 DIAGNOSIS — K3189 Other diseases of stomach and duodenum: Secondary | ICD-10-CM | POA: Diagnosis not present

## 2021-10-06 DIAGNOSIS — C7A012 Malignant carcinoid tumor of the ileum: Secondary | ICD-10-CM | POA: Diagnosis not present

## 2021-10-06 DIAGNOSIS — N2889 Other specified disorders of kidney and ureter: Secondary | ICD-10-CM | POA: Diagnosis not present

## 2021-10-06 DIAGNOSIS — C7A00 Malignant carcinoid tumor of unspecified site: Secondary | ICD-10-CM | POA: Diagnosis not present

## 2021-10-06 DIAGNOSIS — Z79899 Other long term (current) drug therapy: Secondary | ICD-10-CM | POA: Diagnosis not present

## 2021-10-06 DIAGNOSIS — K6389 Other specified diseases of intestine: Secondary | ICD-10-CM | POA: Diagnosis not present

## 2021-10-06 DIAGNOSIS — G629 Polyneuropathy, unspecified: Secondary | ICD-10-CM | POA: Diagnosis not present

## 2021-10-06 DIAGNOSIS — R918 Other nonspecific abnormal finding of lung field: Secondary | ICD-10-CM | POA: Diagnosis not present

## 2021-10-06 DIAGNOSIS — R748 Abnormal levels of other serum enzymes: Secondary | ICD-10-CM | POA: Diagnosis not present

## 2021-10-06 DIAGNOSIS — N281 Cyst of kidney, acquired: Secondary | ICD-10-CM | POA: Diagnosis not present

## 2021-10-08 DIAGNOSIS — Z0001 Encounter for general adult medical examination with abnormal findings: Secondary | ICD-10-CM | POA: Diagnosis not present

## 2021-10-08 DIAGNOSIS — N189 Chronic kidney disease, unspecified: Secondary | ICD-10-CM | POA: Diagnosis not present

## 2021-10-08 DIAGNOSIS — Z13820 Encounter for screening for osteoporosis: Secondary | ICD-10-CM | POA: Diagnosis not present

## 2021-10-08 DIAGNOSIS — I10 Essential (primary) hypertension: Secondary | ICD-10-CM | POA: Diagnosis not present

## 2021-10-08 DIAGNOSIS — R7301 Impaired fasting glucose: Secondary | ICD-10-CM | POA: Diagnosis not present

## 2021-10-08 DIAGNOSIS — C801 Malignant (primary) neoplasm, unspecified: Secondary | ICD-10-CM | POA: Diagnosis not present

## 2021-10-08 DIAGNOSIS — E782 Mixed hyperlipidemia: Secondary | ICD-10-CM | POA: Diagnosis not present

## 2021-10-08 DIAGNOSIS — D509 Iron deficiency anemia, unspecified: Secondary | ICD-10-CM | POA: Diagnosis not present

## 2021-10-08 DIAGNOSIS — E119 Type 2 diabetes mellitus without complications: Secondary | ICD-10-CM | POA: Diagnosis not present

## 2021-10-09 ENCOUNTER — Other Ambulatory Visit (HOSPITAL_COMMUNITY): Payer: Self-pay | Admitting: Family Medicine

## 2021-10-09 DIAGNOSIS — Z13820 Encounter for screening for osteoporosis: Secondary | ICD-10-CM

## 2021-10-13 DIAGNOSIS — E785 Hyperlipidemia, unspecified: Secondary | ICD-10-CM | POA: Diagnosis not present

## 2021-10-13 DIAGNOSIS — I10 Essential (primary) hypertension: Secondary | ICD-10-CM | POA: Diagnosis not present

## 2021-10-21 ENCOUNTER — Encounter: Payer: Self-pay | Admitting: *Deleted

## 2021-11-03 DIAGNOSIS — Z5111 Encounter for antineoplastic chemotherapy: Secondary | ICD-10-CM | POA: Diagnosis not present

## 2021-11-03 DIAGNOSIS — C7A012 Malignant carcinoid tumor of the ileum: Secondary | ICD-10-CM | POA: Diagnosis not present

## 2021-11-16 DIAGNOSIS — D3A029 Benign carcinoid tumor of the large intestine, unspecified portion: Secondary | ICD-10-CM | POA: Diagnosis not present

## 2021-11-16 DIAGNOSIS — C7989 Secondary malignant neoplasm of other specified sites: Secondary | ICD-10-CM | POA: Diagnosis not present

## 2021-11-16 DIAGNOSIS — R0601 Orthopnea: Secondary | ICD-10-CM | POA: Diagnosis not present

## 2021-11-16 DIAGNOSIS — R0609 Other forms of dyspnea: Secondary | ICD-10-CM | POA: Diagnosis not present

## 2021-12-01 DIAGNOSIS — C7A012 Malignant carcinoid tumor of the ileum: Secondary | ICD-10-CM | POA: Diagnosis not present

## 2021-12-02 DIAGNOSIS — G894 Chronic pain syndrome: Secondary | ICD-10-CM | POA: Diagnosis not present

## 2021-12-02 DIAGNOSIS — E1142 Type 2 diabetes mellitus with diabetic polyneuropathy: Secondary | ICD-10-CM | POA: Diagnosis not present

## 2021-12-02 DIAGNOSIS — M5451 Vertebrogenic low back pain: Secondary | ICD-10-CM | POA: Diagnosis not present

## 2021-12-02 DIAGNOSIS — M130 Polyarthritis, unspecified: Secondary | ICD-10-CM | POA: Diagnosis not present

## 2021-12-02 DIAGNOSIS — Z79891 Long term (current) use of opiate analgesic: Secondary | ICD-10-CM | POA: Diagnosis not present

## 2021-12-10 ENCOUNTER — Other Ambulatory Visit (HOSPITAL_COMMUNITY): Payer: Self-pay | Admitting: Family Medicine

## 2021-12-10 DIAGNOSIS — M79604 Pain in right leg: Secondary | ICD-10-CM

## 2021-12-10 DIAGNOSIS — M79605 Pain in left leg: Secondary | ICD-10-CM | POA: Diagnosis not present

## 2021-12-10 DIAGNOSIS — R600 Localized edema: Secondary | ICD-10-CM | POA: Diagnosis not present

## 2021-12-21 ENCOUNTER — Ambulatory Visit (HOSPITAL_COMMUNITY)
Admission: RE | Admit: 2021-12-21 | Discharge: 2021-12-21 | Disposition: A | Discharge status: Home / Self Care | Payer: Medicare PPO | Source: Ambulatory Visit | Attending: Family Medicine | Admitting: Family Medicine

## 2021-12-21 DIAGNOSIS — E782 Mixed hyperlipidemia: Secondary | ICD-10-CM | POA: Diagnosis not present

## 2021-12-21 DIAGNOSIS — M79662 Pain in left lower leg: Secondary | ICD-10-CM | POA: Diagnosis not present

## 2021-12-21 DIAGNOSIS — M79661 Pain in right lower leg: Secondary | ICD-10-CM | POA: Diagnosis not present

## 2021-12-21 DIAGNOSIS — M79604 Pain in right leg: Secondary | ICD-10-CM | POA: Diagnosis not present

## 2021-12-21 DIAGNOSIS — I10 Essential (primary) hypertension: Secondary | ICD-10-CM | POA: Diagnosis not present

## 2021-12-21 DIAGNOSIS — M79605 Pain in left leg: Secondary | ICD-10-CM | POA: Insufficient documentation

## 2021-12-22 ENCOUNTER — Ambulatory Visit (INDEPENDENT_AMBULATORY_CARE_PROVIDER_SITE_OTHER): Payer: Medicare HMO | Admitting: *Deleted

## 2021-12-22 VITALS — Ht 64.5 in | Wt 180.4 lb

## 2021-12-22 DIAGNOSIS — Z1211 Encounter for screening for malignant neoplasm of colon: Secondary | ICD-10-CM

## 2021-12-22 NOTE — Progress Notes (Signed)
Pt said she was seen at [facility] 4 or more years ago.  Gets shots at [facility]. ?

## 2021-12-22 NOTE — Progress Notes (Signed)
Gastroenterology Pre-Procedure Review ? ?Request Date: 12/22/2021 ?Requesting Physician: Eloy End, FNP-C, personal hx of colon cancer, diagnosed 2013 or 2014 per pt, Last TCS done 06/14/2012 by Dr. Gala Romney, normal rectum and colon, family hx of colon cancer (mother and aunt) ? ?PATIENT REVIEW QUESTIONS: The patient responded to the following health history questions as indicated:   ? ?1. Diabetes Melitis: yes, but not on Metformin anymore, trying to manage it with diet and exercise ?2. Joint replacements in the past 12 months: no ?3. Major health problems in the past 3 months: no ?4. Has an artificial valve or MVP: no ?5. Has a defibrillator: no ?6. Has been advised in past to take antibiotics in advance of a procedure like teeth cleaning: no ?7. Family history of colon cancer: yes, mother: age 34, aunt: age 58 ?73. Alcohol Use: no ?9. Illicit drug Use: no ?10. History of sleep apnea: yes, but doesn't use CPAP anymore  ?11. History of coronary artery or other vascular stents placed within the last 12 months: no ?12. History of any prior anesthesia complications: no ?13. Body mass index is 30.49 kg/m?. ?   ?MEDICATIONS & ALLERGIES:    ?Patient reports the following regarding taking any blood thinners:   ?Plavix? no ?Aspirin? no ?Coumadin? no ?Brilinta? no ?Xarelto? no ?Eliquis? no ?Pradaxa? no ?Savaysa? no ?Effient? no ? ?Patient confirms/reports the following medications:  ?Current Outpatient Medications  ?Medication Sig Dispense Refill  ? albuterol (VENTOLIN HFA) 108 (90 Base) MCG/ACT inhaler Inhale 2 puffs into the lungs every 6 (six) hours as needed for wheezing or shortness of breath.     ? buprenorphine (SUBUTEX) 2 MG SUBL SL tablet 8 mg. Takes 1/2 tablet in morning and 1/2 tablet in the evening.    ? cetirizine (ZYRTEC) 10 MG tablet Take 10 mg by mouth daily as needed for allergies.     ? DULoxetine (CYMBALTA) 30 MG capsule Take by mouth daily at 6 (six) AM.    ? DULoxetine (CYMBALTA) 60 MG capsule Take 60 mg by  mouth daily.    ? enalapril (VASOTEC) 10 MG tablet Take 10 mg by mouth daily.     ? fluticasone (FLONASE) 50 MCG/ACT nasal spray Place 1-2 sprays into both nostrils daily as needed for allergies or rhinitis.    ? gabapentin (NEURONTIN) 600 MG tablet Take 600 mg by mouth 4 (four) times daily.    ? HYDROcodone-acetaminophen (NORCO) 10-325 MG tablet Take 1 tablet by mouth 5 (five) times daily.    ? ibuprofen (ADVIL) 800 MG tablet Take 1 tablet (800 mg total) by mouth every 8 (eight) hours as needed. (Patient taking differently: Take 800 mg by mouth as needed.) 90 tablet 1  ? linaclotide (LINZESS) 72 MCG capsule Take 72 mcg by mouth daily before breakfast.    ? Melatonin 10 MG TABS Take 10 mg by mouth at bedtime.    ? metoprolol (LOPRESSOR) 50 MG tablet Take 50 mg by mouth 2 (two) times daily.    ? montelukast (SINGULAIR) 10 MG tablet Take 10 mg by mouth at bedtime.     ? Multiple Vitamin (MULTIVITAMIN WITH MINERALS) TABS tablet Take 1 tablet by mouth daily.    ? naloxone (NARCAN) 2 MG/2ML injection Place 1 mg into the nose as directed.    ? octreotide (SANDOSTATIN LAR) 10 MG injection Inject 10 mg into the muscle every 28 (twenty-eight) days.    ? pantoprazole (PROTONIX) 40 MG tablet Take 40 mg by mouth at bedtime.     ?  polyethylene glycol powder (GLYCOLAX/MIRALAX) 17 GM/SCOOP powder Take 17 g by mouth daily as needed for mild constipation or moderate constipation.    ? prochlorperazine (COMPAZINE) 5 MG tablet Take 5 mg by mouth every 6 (six) hours as needed for nausea or vomiting.    ? rOPINIRole (REQUIP) 0.25 MG tablet Take 0.75 mg by mouth 3 (three) times daily.     ? tiZANidine (ZANAFLEX) 2 MG tablet Take 2 mg by mouth 2 (two) times daily as needed.    ? ?No current facility-administered medications for this visit.  ? ? ?Patient confirms/reports the following allergies:  ?Allergies  ?Allergen Reactions  ? Nickel Swelling  ? Sulfa Antibiotics Hives  ? Clarithromycin Rash  ? Codeine Itching and Other (See Comments)   ?  Headaches  ? Lamotrigine Rash  ? Tape Itching  ? ? ?No orders of the defined types were placed in this encounter. ? ? ?AUTHORIZATION INFORMATION ?Primary Insurance: [place],  [place] #: [id number],  Group #: [id number] ?Pre-Cert / Josem Kaufmann required:  ?Pre-Cert / Auth #:  ? ? ?SCHEDULE INFORMATION: ?Procedure has been scheduled as follows:  ?Date: , Time:   ?Location: [facility] with Dr. Gala Romney ? ?This Gastroenterology Pre-Precedure Review Form is being routed to the following provider(s): Roseanne Kaufman, NP ?  ?

## 2021-12-23 ENCOUNTER — Institutional Professional Consult (permissible substitution): Payer: Medicare HMO | Admitting: Neurology

## 2021-12-23 NOTE — Progress Notes (Signed)
Routed to Venetia Night, NP in [place], NP's absence.   ?

## 2021-12-24 NOTE — Progress Notes (Signed)
Patient recently seen by general surgery for concerns of small bowel cancer and potential resection and c/o new shortness of breath and chest pain. ASA 3/4, needs office visit and cardiology evaluation prior to scheduling procedure.  ? ?Venetia Night, MSN, FNP-BC, AGACNP-BC ?[facility] ? ? ?

## 2021-12-28 NOTE — Progress Notes (Signed)
Spoke to pt.  Informed her that ov was needed due to medical hx.  Pt voiced understanding and scheduled ov for 12/30/2021 at 2:30 with Neil Crouch, PA-C. ?

## 2021-12-29 DIAGNOSIS — D3A029 Benign carcinoid tumor of the large intestine, unspecified portion: Secondary | ICD-10-CM | POA: Diagnosis not present

## 2021-12-30 ENCOUNTER — Encounter: Payer: Self-pay | Admitting: Gastroenterology

## 2021-12-30 ENCOUNTER — Ambulatory Visit: Payer: Medicare PPO | Admitting: Gastroenterology

## 2021-12-30 VITALS — BP 124/62 | HR 54 | Temp 97.3°F | Ht 65.0 in | Wt 179.8 lb

## 2021-12-30 DIAGNOSIS — C7A012 Malignant carcinoid tumor of the ileum: Secondary | ICD-10-CM

## 2021-12-30 DIAGNOSIS — Z79891 Long term (current) use of opiate analgesic: Secondary | ICD-10-CM | POA: Diagnosis not present

## 2021-12-30 DIAGNOSIS — E1142 Type 2 diabetes mellitus with diabetic polyneuropathy: Secondary | ICD-10-CM | POA: Diagnosis not present

## 2021-12-30 DIAGNOSIS — Z800 Family history of malignant neoplasm of digestive organs: Secondary | ICD-10-CM | POA: Diagnosis not present

## 2021-12-30 DIAGNOSIS — K5900 Constipation, unspecified: Secondary | ICD-10-CM | POA: Diagnosis not present

## 2021-12-30 DIAGNOSIS — M5451 Vertebrogenic low back pain: Secondary | ICD-10-CM | POA: Diagnosis not present

## 2021-12-30 DIAGNOSIS — G894 Chronic pain syndrome: Secondary | ICD-10-CM | POA: Diagnosis not present

## 2021-12-30 DIAGNOSIS — D3A012 Benign carcinoid tumor of the ileum: Secondary | ICD-10-CM | POA: Insufficient documentation

## 2021-12-30 DIAGNOSIS — M130 Polyarthritis, unspecified: Secondary | ICD-10-CM | POA: Diagnosis not present

## 2021-12-30 NOTE — Patient Instructions (Addendum)
Trial of Linzess 153mg and 2979m. Take one daily of 14590mand see if more effective in management of your constipation. If not, then increase to 290m32maily. Let me know which dose is best for you. ?I will get copy of last colonoscopy report for review. ?Further recommendations to follow regarding updating colonoscopy. ?

## 2021-12-30 NOTE — Progress Notes (Signed)
? ? ? ?GI Office Note   ? ?Referring Provider: Celene Squibb, MD ?Primary Care Physician:  Celene Squibb, MD  ?Primary Gastroenterologist: Garfield Cornea, MD ? ? ?Chief Complaint  ? ?Chief Complaint  ?Patient presents with  ? Colonoscopy  ? ? ? ?History of Present Illness  ? ?Pamela Hatfield is a 71 y.o. female presenting today at the request of Dr. Nevada Crane for consideration of colonoscopy. Due to her medical history she was brought in prior to scheduling procedure. Patient has history of colon cancer in first degree relative (father at unknown age). Also with several second degree relatives with history of colon cancer.  ? 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  Tissue sampling has not been successful, balloon enteroscopy to try to obtain tissue from the ileal primary lesion in January 2015 could not be completed, unable to deeply intubate the TI.  Completed PET dotatate showing lymph nodes were dotatate avid increasing probability of this being a carcinoid tumor.  Periaortic tumor burden was not felt to be resectable at the time of initial presentation due to centrally located nodal disease at the origin of the SMA.  Most recent imaging in January 2023 suggested ileal carcinoid with regional adenopathy.  Felt to have a low volume disease and has been observed, mild progression of disease noted.  She has been receiving monthly octreotide injections.  She has had some new orthopnea and DOE.  Cardiology evaluation suggested.  Short interval follow-up with Dr. Bailey Mech to discuss potential resection of her primary tumor/small bowel resection.  Also followed by hematology/oncology, Dr. Karle Starch ?  ?Patient believes her last colonoscopy was about 5 years ago at [facility]. Her daughter has had multiple colon polyps in her 24s, possible with carcinoma in situ and EMR per patient. She also has had thyroid cancer. Patient states she has  not seen cardiology. Used to see [facility] in [place]. Last evaluation in 2017 with cath about 10 years ago. Patient has history of sleep apnea but has not used CPAP in years. She cannot lay flat in the bed as she finds it hard to breathe. She has chronically slept in the recliner. For 2 years she has had DOE. She denies SOB with moving around inside the house. She has SOB if picking over to pick something up from the floor. She states her last surgery was in 2021 and did fine with anesthesia.  ? ?Patient has pain in the center of her lower chest and radiates into the throat, lasts for 30 minutes at a time. No associated SOB or diaphoresis. Takes antacids at times for this. Sits down and rests. Pain is sharp/burning quality. She denies dysphagia. BMs irregular, can go every day but then can go more than a week without stool. No melena, brbpr. If no BM in 2 days then starts miralax. Gets Linzess 70mg thru patient assistance. If she runs out she can goes days without BM. ? ?She takes oxycodone at times, 0-2 per day. Subutex 1/2 tablet bid. ? ?Labs from yesterday: Sodium 134, potassium 4.3, glucose 108, BUN 11, creatinine 0.86, total bilirubin 0.4, alkaline phosphatase 164, AST 16, ALT 11.  White blood cell count 10,600, hemoglobin 11.9, hematocrit 36.5, MCV 85.6, platelets 343,000.  March 2023: Alkaline phosphatase 190, liver fraction 60% normal, bone fraction 36% normal, intestine fraction 4% normal. ? ?Colonoscopy October 2013 normal.  Repeat colonoscopy recommended in 5 years due  to family history. ?EGD October 2013, endoscopically normal-appearing esophagus status post dilation for history of dysphagia, small hiatal hernia, submucosal gastric mass versus extrinsic mass, mild erythema of the stomach lining.  Consider EUS. ? ?EUS completed October 2013: Biopsies unremarkable.  Recommended repeat EUS in 12 months. ?EUS completed November 2014 at [facility]: Gastric subepithelial lesion  noted, pathology revealed benign gastric mucosa with reactive gastropathy and focal intestinal metaplasia. ?According to hematology records she had an EUS with biopsy March 2018 with no evidence of malignancy as well, plans for repeat in 2023. ? ?Double Balloon Enteroscopy 09/2013: ?IMPRESSIONS:     1.  Normal colon, ileoceal valve and terminal ileum.  ?2.  Deep intubation of the terminal ileum was not possible.  ?RECOMMENDATIONS:     Follow-up with Dr. Nelda Marseille.  I think a surgical option  ?should be re-considered.  ? ?PATHOLOGY:     Pathology - No Specimens Obtained  ? ?CT abdomen pelvis with contrast January 2023 for history of malignant carcinoid tumor ?CONCLUSION:  ?1.  Overall similar size and appearance of mesenteric nodal mass.  ?2.  Hyperenhancing lesion of the proximal ileum with focal wall thickening likely represents the primary tumor, further described above.  ?3.  Slightly increased size of left lower pole renal cystic lesion, measuring 1.6 x 1.4 cm (series 2, image 119), previously 1.5 x 1.2 cm in 2018. Recommend attention on follow-up imaging.  ?4.  Ancillary findings as above. ?Exam End: 10/06/21 10:35  ? ?PET tumor imaging January 2022: ? PET findings are reflective of interval disease progression as demonstrated by slight increased size of bowel and mesenteric deposits with stability of the larger mesenteric mass as detailed above. No new tracer avid metastatic lesions evident.  ?2.   No definite abnormal focal uptake in body of pancreas in current study.  ?3.  Additional CT findings as above. ? ? ?Medications  ? ?Current Outpatient Medications  ?Medication Sig Dispense Refill  ? albuterol (VENTOLIN HFA) 108 (90 Base) MCG/ACT inhaler Inhale 2 puffs into the lungs every 6 (six) hours as needed for wheezing or shortness of breath.     ? buprenorphine (SUBUTEX) 2 MG SUBL SL tablet 8 mg. Takes 1/2 tablet in morning and 1/2 tablet in the evening.    ? cetirizine (ZYRTEC) 10 MG tablet Take 10 mg by mouth  daily as needed for allergies.     ? DULoxetine (CYMBALTA) 30 MG capsule Take by mouth daily at 6 (six) AM.    ? DULoxetine (CYMBALTA) 60 MG capsule Take 60 mg by mouth daily.    ? enalapril (VASOTEC) 10 MG tablet Take 10 mg by mouth daily.     ? fluticasone (FLONASE) 50 MCG/ACT nasal spray Place 1-2 sprays into both nostrils daily as needed for allergies or rhinitis.    ? gabapentin (NEURONTIN) 600 MG tablet Take 600 mg by mouth 4 (four) times daily.    ? ibuprofen (ADVIL) 800 MG tablet Take 1 tablet (800 mg total) by mouth every 8 (eight) hours as needed. (Patient taking differently: Take 800 mg by mouth as needed.) 90 tablet 1  ? linaclotide (LINZESS) 72 MCG capsule Take 72 mcg by mouth daily before breakfast.    ? Melatonin 10 MG TABS Take 10 mg by mouth at bedtime.    ? metoprolol (LOPRESSOR) 50 MG tablet Take 50 mg by mouth 2 (two) times daily.    ? montelukast (SINGULAIR) 10 MG tablet Take 10 mg by mouth at bedtime.     ?  Multiple Vitamin (MULTIVITAMIN WITH MINERALS) TABS tablet Take 1 tablet by mouth daily.    ? naloxone (NARCAN) 2 MG/2ML injection Place 1 mg into the nose as directed.    ? octreotide (SANDOSTATIN LAR) 10 MG injection Inject 10 mg into the muscle every 28 (twenty-eight) days.    ? oxyCODONE-acetaminophen (PERCOCET/ROXICET) 5-325 MG tablet oxycodone-acetaminophen 5 mg-325 mg tablet    ? pantoprazole (PROTONIX) 40 MG tablet Take 40 mg by mouth at bedtime.     ? polyethylene glycol powder (GLYCOLAX/MIRALAX) 17 GM/SCOOP powder Take 17 g by mouth daily as needed for mild constipation or moderate constipation.    ? prochlorperazine (COMPAZINE) 5 MG tablet Take 5 mg by mouth every 6 (six) hours as needed for nausea or vomiting.    ? rOPINIRole (REQUIP) 0.25 MG tablet Take 0.75 mg by mouth 3 (three) times daily.     ? tiZANidine (ZANAFLEX) 2 MG tablet Take 2 mg by mouth 2 (two) times daily as needed.    ? ?No current facility-administered medications for this visit.  ? ? ?Allergies  ? ?Allergies as  of 12/30/2021 - Review Complete 12/30/2021  ?Allergen Reaction Noted  ? Nickel Swelling 01/03/2018  ? Sulfa antibiotics Hives 03/31/2011  ? Clarithromycin Rash 03/31/2011  ? Codeine Itching and Other (See

## 2022-01-03 ENCOUNTER — Telehealth: Payer: Self-pay | Admitting: Gastroenterology

## 2022-01-03 DIAGNOSIS — R079 Chest pain, unspecified: Secondary | ICD-10-CM

## 2022-01-03 DIAGNOSIS — R0609 Other forms of dyspnea: Secondary | ICD-10-CM

## 2022-01-03 NOTE — Telephone Encounter (Signed)
Please arrange cardiology referral for evaluation of DOE/chest pain. Patient seen remotely by Dr. Gwenlyn Found in 2017. Patient ok with seeing [facility] in [place], ?

## 2022-01-04 NOTE — Telephone Encounter (Signed)
Referral placed.

## 2022-01-04 NOTE — Addendum Note (Signed)
Addended by: Cheron Every on: 01/04/2022 07:44 AM ? ? Modules accepted: Orders ? ?

## 2022-01-12 NOTE — Progress Notes (Signed)
?Cardiology Office Note:   ? ?Date:  01/13/2022  ? ?ID:  Pamela Hatfield, DOB 27-Jan-1951, MRN [id number] ? ?PCP:  Celene Squibb, MD  ? ?[facility] Prov[facility] ?Cardiologist:  Lenna Sciara, MD ?Referring MD: Mahala Menghini, PA-C  ? ?Chief Complaint/Reason for Referral: Chest pain and shortness of breath ? ?ASSESSMENT:   ? ?1. Precordial pain   ?2. Type 2 diabetes mellitus without complication, without long-term current use of insulin ([place])   ?3. Hypertension associated with diabetes ([place])   ?4. Hyperlipidemia associated with type 2 diabetes mellitus ([place])   ?5. Carcinoid tumor, unspecified site, unspecified whether malignant   ? ? ?PLAN:   ? ?In order of problems listed above: ?1.  Precordial pain:  We will obtain a coronary CTA and echocardiogram to evaluate further.  If the patient has mild obstructive coronary artery disease, they will require a statin (with goal LDL < 70) and aspirin, if they have high-grade disease we will need to consider optimal medical therapy and if symptoms are refractory to medical therapy, then a cardiac catheterization with possible PCI will be pursued to alleviate symptoms.  If they have high risk disease we will proceed directly to cardiac catheterization.  Follow up 6 months. ?2.  Type 2 diabetes: Start aspirin 81 mg, continue enalapril, start atorvastatin 40 mg daily.  Consider SGLT2i in the future. ?3.  Hypertension: Continue enalapril with goal blood pressure less than 130/80 mmHg. ?4.  Hyperlipidemia: Given that she is a diabetic her goal LDL is less than 70.  We will start atorvastatin and check a panel and LFTs in 2 months.  Patient was intolerant of simvastatin in the past.  If she cannot tolerate atorvastatin, will refer to pharmacy. ?5.  Carcinoid tumor: This is being managed by other providers. ? ?Dispo:  Return in about 6 months (around 07/16/2022).  ? ?Medication Adjustments/Labs and Tests Ordered: ?Current medicines are reviewed at length with the patient today.  Concerns  regarding medicines are outlined above. ? ?The following changes have been made:    ? ?Labs/tests ordered: ?No orders of the defined types were placed in this encounter. ? ? ?Medication Changes: ?No orders of the defined types were placed in this encounter. ? ? ? ?Current medicines are reviewed at length with the patient today.  The patient does not have concerns regarding medicines. ? ? ?History of Present Illness:   ? ?FOCUSED PROBLEM LIST:   ?1.  Type 2 diabetes, diet controlled ?3.  Hyperlipidemia ?4.  Fibromyalgia ?5.  GERD with hiatal hernia ?6.  OSA, intolerant of CPAP ?7.  Carcinoid tumor of small intestine followed by otology oncology [facility]; on octreotide ? ?The patient is a 71 y.o. female with the indicated medical history here for recommendations regarding chest pain and dyspnea.  The patient has had 3-4 episodes of chest pain over the last 12 months.  She tells me that they sometimes occur at rest and sometimes occur with exertion but not every time she exerts herself.  She describes a sharp chest pain that radiates to her neck bilaterally.  She does have chronic shortness of breath that has been relatively unchanged over the past several years.  She denies any presyncope or syncope.  She has had no severe lower extremity edema but has noted redness and swelling of her knees bilaterally when she gets up in the morning.  They are associated with stiffness and she thinks she might have arthritis of some sort.  She has required  no emergency room visits or hospitalizations. ? ?She tells me that she had been on simvastatin in the past but could not tolerate it due to myalgias and arthralgias.  She is currently not on statin medication. ? ?Current Medications: ?Current Meds  ?Medication Sig  ? albuterol (VENTOLIN HFA) 108 (90 Base) MCG/ACT inhaler Inhale 2 puffs into the lungs every 6 (six) hours as needed for wheezing or shortness of breath.   ? buprenorphine (SUBUTEX) 2 MG SUBL SL tablet 8 mg. Takes  1/2 tablet in morning and 1/2 tablet in the evening.  ? cetirizine (ZYRTEC) 10 MG tablet Take 10 mg by mouth daily as needed for allergies.   ? DULoxetine (CYMBALTA) 30 MG capsule Take by mouth daily at 6 (six) AM.  ? DULoxetine (CYMBALTA) 60 MG capsule Take 60 mg by mouth daily.  ? enalapril (VASOTEC) 10 MG tablet Take 10 mg by mouth daily.   ? fluticasone (FLONASE) 50 MCG/ACT nasal spray Place 1-2 sprays into both nostrils daily as needed for allergies or rhinitis.  ? gabapentin (NEURONTIN) 600 MG tablet Take 600 mg by mouth 4 (four) times daily.  ? ibuprofen (ADVIL) 800 MG tablet Take 1 tablet (800 mg total) by mouth every 8 (eight) hours as needed. (Patient taking differently: Take 800 mg by mouth as needed.)  ? linaclotide (LINZESS) 72 MCG capsule Take 72 mcg by mouth daily before breakfast.  ? Melatonin 10 MG TABS Take 10 mg by mouth at bedtime.  ? metoprolol (LOPRESSOR) 50 MG tablet Take 50 mg by mouth 2 (two) times daily.  ? montelukast (SINGULAIR) 10 MG tablet Take 10 mg by mouth at bedtime.   ? Multiple Vitamin (MULTIVITAMIN WITH MINERALS) TABS tablet Take 1 tablet by mouth daily.  ? naloxone (NARCAN) 2 MG/2ML injection Place 1 mg into the nose as directed.  ? octreotide (SANDOSTATIN LAR) 10 MG injection Inject 10 mg into the muscle every 28 (twenty-eight) days.  ? oxyCODONE-acetaminophen (PERCOCET/ROXICET) 5-325 MG tablet oxycodone-acetaminophen 5 mg-325 mg tablet  ? pantoprazole (PROTONIX) 40 MG tablet Take 40 mg by mouth at bedtime.   ? polyethylene glycol powder (GLYCOLAX/MIRALAX) 17 GM/SCOOP powder Take 17 g by mouth daily as needed for mild constipation or moderate constipation.  ? prochlorperazine (COMPAZINE) 5 MG tablet Take 5 mg by mouth every 6 (six) hours as needed for nausea or vomiting.  ? rOPINIRole (REQUIP) 0.25 MG tablet Take 0.75 mg by mouth 3 (three) times daily.   ? tiZANidine (ZANAFLEX) 2 MG tablet Take 2 mg by mouth 2 (two) times daily as needed.  ?  ? ?Allergies:    ?Nickel, Sulfa  antibiotics, Clarithromycin, Codeine, Lamotrigine, and Tape  ? ?Social History:   ?Social History  ? ?Tobacco Use  ? Smoking status: Former  ?  Packs/day: 0.50  ?  Years: 20.00  ?  Pack years: 10.00  ?  Types: Cigarettes  ?  Quit date: 09/13/1992  ?  Years since quitting: 29.3  ? Smokeless tobacco: Never  ? Tobacco comments:  ?  quit about 25 + years ago  ?Vaping Use  ? Vaping Use: Never used  ?Substance Use Topics  ? Alcohol use: No  ? Drug use: No  ?  ? ?Family Hx: ?Family History  ?Problem Relation Age of Onset  ? Cancer Mother   ?     died age 31, ?not sure what kind, metastatic at time of diagnosis  ? Colon cancer Father   ? Colon cancer Maternal Aunt   ?  Colon cancer Maternal Uncle   ?  ? ?Review of Systems:   ?Please see the history of present illness.    ?All other systems reviewed and are negative. ?  ? ? ?EKGs/Labs/Other Test Reviewed:   ? ?EKG:  EKG performed May 2021 that I personally reviewed demonstrates sinus rhythm with possible inferior infarction pattern; EKG performed today that I personally reviewed demonstrates sinus rhythm with nonspecific ST and T wave changes. ? ?Prior CV studies: ? ?2017 monitor: Normal sinus rhythm ? ?2010 Coronary angiography without obstructive disease ? ?Other studies Reviewed: ?Review of the additional studies/records demonstrates: Prior CT imaging without aortic atherosclerosis ? ?Recent Labs: ?No results found for requested labs within last 8760 hours.  ? ?Recent Lipid Panel ?No results found for: CHOL, TRIG, HDL, LDLCALC, LDLDIRECT ? ?Risk Assessment/Calculations:   ? ?  ?    ? ?Physical Exam:   ? ?VS:  BP 120/82   Pulse 64   Ht '5\' 5"'$  (1.651 m)   Wt 179 lb 9.6 oz (81.5 kg)   SpO2 95%   BMI 29.89 kg/m?    ?Wt Readings from Last 3 Encounters:  ?01/13/22 179 lb 9.6 oz (81.5 kg)  ?12/30/21 179 lb 12.8 oz (81.6 kg)  ?12/22/21 180 lb 6.4 oz (81.8 kg)  ?  ?GENERAL:  No apparent distress, AOx3 ?HEENT:  No carotid bruits, +2 carotid impulses, no scleral icterus ?CAR: RRR  no murmurs, gallops, rubs, or thrills ?RES:  Clear to auscultation bilaterally ?ABD:  Soft, nontender, nondistended, positive bowel sounds x 4 ?VASC:  +2 radial pulses, +2 carotid pulses, palpable pedal pulses

## 2022-01-13 ENCOUNTER — Ambulatory Visit: Payer: Medicare PPO | Admitting: Internal Medicine

## 2022-01-13 ENCOUNTER — Encounter: Payer: Self-pay | Admitting: Internal Medicine

## 2022-01-13 VITALS — BP 120/82 | HR 64 | Ht 65.0 in | Wt 179.6 lb

## 2022-01-13 DIAGNOSIS — E1169 Type 2 diabetes mellitus with other specified complication: Secondary | ICD-10-CM

## 2022-01-13 DIAGNOSIS — R072 Precordial pain: Secondary | ICD-10-CM

## 2022-01-13 DIAGNOSIS — I152 Hypertension secondary to endocrine disorders: Secondary | ICD-10-CM | POA: Diagnosis not present

## 2022-01-13 DIAGNOSIS — E119 Type 2 diabetes mellitus without complications: Secondary | ICD-10-CM

## 2022-01-13 DIAGNOSIS — E785 Hyperlipidemia, unspecified: Secondary | ICD-10-CM

## 2022-01-13 DIAGNOSIS — D3A00 Benign carcinoid tumor of unspecified site: Secondary | ICD-10-CM | POA: Diagnosis not present

## 2022-01-13 DIAGNOSIS — E1159 Type 2 diabetes mellitus with other circulatory complications: Secondary | ICD-10-CM | POA: Diagnosis not present

## 2022-01-13 DIAGNOSIS — D3A029 Benign carcinoid tumor of the large intestine, unspecified portion: Secondary | ICD-10-CM

## 2022-01-13 MED ORDER — ASPIRIN EC 81 MG PO TBEC: 81.0000 mg | DELAYED_RELEASE_TABLET | Freq: Every day | ORAL | 3 refills | Status: DC

## 2022-01-13 MED ORDER — ATORVASTATIN CALCIUM 40 MG PO TABS: 40.0000 mg | ORAL_TABLET | Freq: Every day | ORAL | 3 refills | Status: DC

## 2022-01-13 NOTE — Patient Instructions (Signed)
Medication Instructions:  ?Your physician has recommended you make the following change in your medication:  ?1.) start aspirin 81 mg - one tablet daily ?2.) start atorvastatin 40 mg - one tablet daily ? ?*If you need a refill on your cardiac medications before your next appointment, please call your pharmacy* ? ? ?Lab Work: ?In about 8 weeks (Lipids/Liver function) ? ? ?Testing/Procedures: ?Your physician has requested that you have an echocardiogram. Echocardiography is a painless test that uses sound waves to create images of your heart. It provides your doctor with information about the size and shape of your heart and how well your heart?s chambers and valves are working. This procedure takes approximately one hour. There are no restrictions for this procedure. ? ?Cardiac CTA - see instructions below ? ? ?Follow-Up: ?At [facility], you and your health needs are our priority.  As part of our continuing mission to provide you with exceptional heart care, we have created designated Provider Care Teams.  These Care Teams include your primary Cardiologist (physician) and Advanced Practice Providers (APPs -  Physician Assistants and Nurse Practitioners) who all work together to provide you with the care you need, when you need it. ? ? ?Your next appointment:   ?6 month(s) ? ?The format for your next appointment:   ?In Person ? ?Provider:   ?Lenna Sciara, MD ? ? ?Other Instructions ? ? ?Your cardiac CT will be scheduled at  ?[facility] ?[address] ?[place], [place] [address] ?[phone] ? ? ?Please arrive at the [facility] Entrance (Entrance C2) of [facility] 30 minutes prior to test start time. ?You can use the FREE valet parking offered at entrance C (encouraged to control the heart rate for the test)  ?Proceed to the [facility] Radiology Department (first floor) to check-in and test prep. ? ?All radiology patients and guests should use entrance C2 at [facility], accessed from [facility], even though the hospital's physical address listed is [address]. ? ? ? ?Please follow these instructions carefully (unless otherwise directed): ? ? ?On the Night Before the Test: ?Be sure to Drink plenty of water. ?Do not consume any caffeinated/decaffeinated beverages or chocolate 12 hours prior to your test. ?Do not take any antihistamines 12 hours prior to your test. ? ?On the Day of the Test: ?Drink plenty of water until 1 hour prior to the test. ?Do not eat any food 4 hours prior to the test. ?You may take your regular medications prior to the test.  ?Take morning dose of metoprolol (Lopressor) two hours prior to test. ?FEMALES- please wear underwire-free bra if available, avoid dresses & tight clothing ?     ?After the Test: ?Drink plenty of water. ?After receiving IV contrast, you may experience a mild flushed feeling. This is normal. ?On occasion, you may experience a mild rash up to 24 hours after the test. This is not dangerous. If this occurs, you can take Benadryl 25 mg and increase your fluid intake. ?If you experience trouble breathing, this can be serious. If it is severe call 911 IMMEDIATELY. If it is mild, please call our office. ?If you take any of these medications: Glipizide/Metformin, Avandament, Glucavance, please do not take 48 hours after completing test unless otherwise instructed. ? ?We will call to schedule your test 2-4 weeks out understanding that some insurance companies will need an authorization prior to the service being performed.  ? ?For non-scheduling related questions, please  contact the cardiac imaging nurse navigator should you have any questions/concerns: ?Marchia Bond, Cardiac Imaging Nurse Navigator ?Gordy Clement, Cardiac Imaging Nurse Navigator ?[facility] ?Direct Office Dial: [phone]  ? ?For scheduling needs, including cancellations and rescheduling, please call [place],  [phone]. ? ?

## 2022-01-26 ENCOUNTER — Other Ambulatory Visit (HOSPITAL_COMMUNITY): Payer: Medicare PPO

## 2022-01-26 DIAGNOSIS — R079 Chest pain, unspecified: Secondary | ICD-10-CM | POA: Diagnosis not present

## 2022-01-26 DIAGNOSIS — G629 Polyneuropathy, unspecified: Secondary | ICD-10-CM | POA: Diagnosis not present

## 2022-01-26 DIAGNOSIS — R748 Abnormal levels of other serum enzymes: Secondary | ICD-10-CM | POA: Diagnosis not present

## 2022-01-26 DIAGNOSIS — D3A029 Benign carcinoid tumor of the large intestine, unspecified portion: Secondary | ICD-10-CM | POA: Diagnosis not present

## 2022-01-26 DIAGNOSIS — K668 Other specified disorders of peritoneum: Secondary | ICD-10-CM | POA: Diagnosis not present

## 2022-01-26 DIAGNOSIS — N281 Cyst of kidney, acquired: Secondary | ICD-10-CM | POA: Diagnosis not present

## 2022-01-26 DIAGNOSIS — K3189 Other diseases of stomach and duodenum: Secondary | ICD-10-CM | POA: Diagnosis not present

## 2022-01-26 DIAGNOSIS — K6389 Other specified diseases of intestine: Secondary | ICD-10-CM | POA: Diagnosis not present

## 2022-01-26 DIAGNOSIS — N2889 Other specified disorders of kidney and ureter: Secondary | ICD-10-CM | POA: Diagnosis not present

## 2022-01-26 DIAGNOSIS — K5900 Constipation, unspecified: Secondary | ICD-10-CM | POA: Diagnosis not present

## 2022-01-26 DIAGNOSIS — Z79899 Other long term (current) drug therapy: Secondary | ICD-10-CM | POA: Diagnosis not present

## 2022-01-26 DIAGNOSIS — C7A012 Malignant carcinoid tumor of the ileum: Secondary | ICD-10-CM | POA: Diagnosis not present

## 2022-01-26 DIAGNOSIS — R0600 Dyspnea, unspecified: Secondary | ICD-10-CM | POA: Diagnosis not present

## 2022-01-27 ENCOUNTER — Telehealth (HOSPITAL_COMMUNITY): Payer: Self-pay | Admitting: *Deleted

## 2022-01-27 DIAGNOSIS — E1142 Type 2 diabetes mellitus with diabetic polyneuropathy: Secondary | ICD-10-CM | POA: Diagnosis not present

## 2022-01-27 DIAGNOSIS — M5451 Vertebrogenic low back pain: Secondary | ICD-10-CM | POA: Diagnosis not present

## 2022-01-27 DIAGNOSIS — M130 Polyarthritis, unspecified: Secondary | ICD-10-CM | POA: Diagnosis not present

## 2022-01-27 DIAGNOSIS — G894 Chronic pain syndrome: Secondary | ICD-10-CM | POA: Diagnosis not present

## 2022-01-27 DIAGNOSIS — Z79891 Long term (current) use of opiate analgesic: Secondary | ICD-10-CM | POA: Diagnosis not present

## 2022-01-27 NOTE — Telephone Encounter (Signed)
Reaching out to patient to offer assistance regarding upcoming cardiac imaging study; pt verbalizes understanding of appt date/time, parking situation and where to check in, pre-test NPO status and medications ordered, and verified current allergies; name and call back number provided for further questions should they arise ? ?Gordy Clement RN Navigator Cardiac Imaging ?[facility] ?[phone] office ?[phone] cell ? ?Patient to take her AM dose of her metoprolol tartrate two hour prior cardiac CT.  She is aware to arrive at 12:30pm. ?

## 2022-01-28 ENCOUNTER — Ambulatory Visit (HOSPITAL_COMMUNITY)
Admission: RE | Admit: 2022-01-28 | Discharge: 2022-01-28 | Disposition: A | Discharge status: Home / Self Care | Payer: Medicare PPO | Source: Ambulatory Visit | Attending: Internal Medicine | Admitting: Internal Medicine

## 2022-01-28 DIAGNOSIS — R072 Precordial pain: Secondary | ICD-10-CM

## 2022-01-28 MED ORDER — NITROGLYCERIN 0.4 MG SL SUBL
0.8000 mg | SUBLINGUAL_TABLET | Freq: Once | SUBLINGUAL | Status: AC
  Administered 2022-01-28: 0.8 mg via SUBLINGUAL

## 2022-01-28 MED ORDER — NITROGLYCERIN 0.4 MG SL SUBL
SUBLINGUAL_TABLET | SUBLINGUAL | Status: AC
  Filled 2022-01-28: qty 2

## 2022-01-28 MED ORDER — IOHEXOL 350 MG/ML SOLN
100.0000 mL | Freq: Once | INTRAVENOUS | Status: AC | PRN
  Administered 2022-01-28: 100 mL via INTRAVENOUS

## 2022-01-29 MED ORDER — PHENYLEPHRINE HCL-NACL 20-0.9 MG/250ML-% IV SOLN
INTRAVENOUS | Status: AC
  Filled 2022-01-29: qty 500

## 2022-02-01 ENCOUNTER — Ambulatory Visit (HOSPITAL_COMMUNITY)
Admission: RE | Admit: 2022-02-01 | Discharge: 2022-02-01 | Disposition: A | Discharge status: Home / Self Care | Payer: Medicare PPO | Source: Ambulatory Visit | Attending: Internal Medicine | Admitting: Internal Medicine

## 2022-02-01 DIAGNOSIS — R072 Precordial pain: Secondary | ICD-10-CM | POA: Diagnosis not present

## 2022-02-01 DIAGNOSIS — R0609 Other forms of dyspnea: Secondary | ICD-10-CM

## 2022-02-01 LAB — ECHOCARDIOGRAM COMPLETE
AR max vel: 2.07 cm2
AV Area VTI: 2.45 cm2
AV Area mean vel: 1.92 cm2
AV Mean grad: 4 mmHg
AV Peak grad: 7.3 mmHg
Ao pk vel: 1.35 m/s
Area-P 1/2: 3.24 cm2
Calc EF: 63.8 %
MV VTI: 2.83 cm2
S' Lateral: 2.9 cm
Single Plane A2C EF: 65.3 %
Single Plane A4C EF: 62.5 %

## 2022-02-01 NOTE — Progress Notes (Incomplete)
*  PRELIMINARY RESULTS* Echocardiogram 2D Echocardiogram has been performed.  Pamela Hatfield 02/01/2022, 1:58 PM

## 2022-02-09 ENCOUNTER — Encounter: Payer: Self-pay | Admitting: Neurology

## 2022-02-09 ENCOUNTER — Ambulatory Visit: Payer: Medicare PPO | Admitting: Neurology

## 2022-02-09 VITALS — BP 131/77 | HR 62 | Ht 65.0 in | Wt 180.0 lb

## 2022-02-09 DIAGNOSIS — M4322 Fusion of spine, cervical region: Secondary | ICD-10-CM

## 2022-02-09 DIAGNOSIS — G253 Myoclonus: Secondary | ICD-10-CM

## 2022-02-09 DIAGNOSIS — R208 Other disturbances of skin sensation: Secondary | ICD-10-CM

## 2022-02-09 DIAGNOSIS — E1142 Type 2 diabetes mellitus with diabetic polyneuropathy: Secondary | ICD-10-CM | POA: Diagnosis not present

## 2022-02-09 DIAGNOSIS — M4802 Spinal stenosis, cervical region: Secondary | ICD-10-CM

## 2022-02-09 DIAGNOSIS — G629 Polyneuropathy, unspecified: Secondary | ICD-10-CM

## 2022-02-09 MED ORDER — LEVETIRACETAM 750 MG PO TABS: 750.0000 mg | ORAL_TABLET | Freq: Two times a day (BID) | ORAL | 11 refills | Status: DC

## 2022-02-09 NOTE — Progress Notes (Signed)
[facility]  PATIENT: Pamela Hatfield DOB: Mar 24, 1951  REFERRING DOCTOR OR PCP:  Celene Squibb, MD SOURCE: patient, notes form PCP, imaging reports.    MRI images personally reviewed.    _________________________________   HISTORICAL  CHIEF COMPLAINT:  Chief Complaint  Patient presents with   New Patient (Initial Visit)    Rm 1, alone. Pt referred for spasmodic movement. C/o of a heavy crushing feeling in legs when waking first thing in the morning. Pt sleeps in a recliner for the last 2 years. Has some numbness in toes.     HISTORY OF PRESENT ILLNESS:  Pamela Hatfield is a 71 yo woman with polyneuropathy and spasms  UPDATE 02/09/2022:  I saw her in 2021 for dysesthesias.   NCV/EMG showed axonal polyneuropathy and left CTS   She has involuntary jerking in her legs and hands.   She notes it most when she is drowsy and in bed.   She gets sleepy a lot. This has woken her up a few times at night.   Bladder function is fine.    She reports that gait is a little off balanced at time.   She also takes ropinirole 0.75 mg nightly for RLS.     She had ACDF C4-C7 11/16/2017.  I reviewed the images.   The pre-surgical MRI 10/12/2017 showed C4C5 greater than  C5C6 and C6C7 spinal stenosis and disc protrusion at C4C5.       She also has lower back pain.   I reviewed her MRI images and she has mild spinal stenosis with lateral recess and foraminal narrowing at L3L4 and right lateral recess stenosis and milder foraminal narrowing at L4L5 that could affect the right L5 nerver root.     She sees Dr. Merlene Laughter for controlled substances.       She has carcinoid near her aorta and in the intestines.   She sees oncology.     She could not tolerate oxcarbazepine due to hyponatremia (was hospitalized).    Lamotrigine had not helped and caused a rash.    Gabapentin only helps a little bit.    Higher dose of duloxetine has not helped.      STUDIES: Labs:  12/11/2019   SPEP, anti-Hu/Yo/Ri, ESR,  SSA/SSB, Hep C, cryoglobulin were negative/noncontribuary  01/15/2020 NCV/EMG study shows the following: 1.  Severe median neuropathy across the left wrist (severe carpal tunnel syndrome). 2.  Minimal median neuropathy across the right wrist 3.  Length dependent axonal polyneuropathy affecting small fibers and large fibers.   MRI cervical spine 10/12/2017:     1. Diffuse cervical spine spondylosis as described above. 2. At C4-5 there is a broad-based disc bulge with a small central disc protrusion contacting the ventral cervical spinal cord. Bilateral uncovertebral degenerative changes. Mild bilateral facet arthropathy. Moderate left foraminal stenosis.  MRI lumbar spine 10/12/2017:   Edema and enhancement of the inferior endplate of L3 and the superior endplate of L4 apparently secondary to acute Schmorl's nodes. This could certainly be associated with back pain. No evidence of fluid intensity material or enhancement within the disc, making disc space infection unlikely. Metastatic disease would not have this pattern.   L3-4 disc bulge and mild bilateral facet hypertrophy. Mild stenosis of both lateral recesses which could possibly be symptomatic.   L4-5 right hemilaminectomy.  No stenosis or neural compression.   L5-S1 asymmetric posterior elements, chronic. No stenosis or neural compression. REVIEW OF SYSTEMS: Constitutional: No fevers, chills, sweats, or change  in appetite Eyes: No visual changes, double vision, eye pain Ear, nose and throat: No hearing loss, ear pain, nasal congestion, sore throat Cardiovascular: No chest pain, palpitations Respiratory:  No shortness of breath at rest or with exertion.   No wheezes GastrointestinaI: No nausea, vomiting, diarrhea, abdominal pain, fecal incontinence Genitourinary:  No dysuria, urinary retention or frequency.  No nocturia. Musculoskeletal:  No neck pain, back pain Integumentary: No rash, pruritus, skin lesions Neurological: as  above Psychiatric: No depression at this time.  No anxiety Endocrine: No palpitations, diaphoresis, change in appetite, change in weigh or increased thirst Hematologic/Lymphatic:  No anemia, purpura, petechiae. Allergic/Immunologic: No itchy/runny eyes, nasal congestion, recent allergic reactions, rashes  ALLERGIES: Allergies  Allergen Reactions   Nickel Swelling   Sulfa Antibiotics Hives   Clarithromycin Rash   Codeine Itching and Other (See Comments)    Headaches   Lamotrigine Rash   Tape Itching    HOME MEDICATIONS:  Current Outpatient Medications:    albuterol (VENTOLIN HFA) 108 (90 Base) MCG/ACT inhaler, Inhale 2 puffs into the lungs every 6 (six) hours as needed for wheezing or shortness of breath. , Disp: , Rfl:    aspirin EC 81 MG tablet, Take 1 tablet (81 mg total) by mouth daily. Swallow whole., Disp: 90 tablet, Rfl: 3   atorvastatin (LIPITOR) 40 MG tablet, Take 1 tablet (40 mg total) by mouth daily., Disp: 90 tablet, Rfl: 3   buprenorphine (SUBUTEX) 2 MG SUBL SL tablet, 8 mg. Takes 1/2 tablet in morning and 1/2 tablet in the evening., Disp: , Rfl:    cetirizine (ZYRTEC) 10 MG tablet, Take 10 mg by mouth daily as needed for allergies. , Disp: , Rfl:    DULoxetine (CYMBALTA) 30 MG capsule, Take by mouth daily at 6 (six) AM., Disp: , Rfl:    DULoxetine (CYMBALTA) 60 MG capsule, Take 60 mg by mouth daily., Disp: , Rfl:    enalapril (VASOTEC) 10 MG tablet, Take 10 mg by mouth daily. , Disp: , Rfl:    fluticasone (FLONASE) 50 MCG/ACT nasal spray, Place 1-2 sprays into both nostrils daily as needed for allergies or rhinitis., Disp: , Rfl:    gabapentin (NEURONTIN) 600 MG tablet, Take 600 mg by mouth 4 (four) times daily., Disp: , Rfl:    ibuprofen (ADVIL) 800 MG tablet, Take 1 tablet (800 mg total) by mouth every 8 (eight) hours as needed. (Patient taking differently: Take 800 mg by mouth as needed.), Disp: 90 tablet, Rfl: 1   levETIRAcetam (KEPPRA) 750 MG tablet, Take 1 tablet  (750 mg total) by mouth 2 (two) times daily., Disp: 120 tablet, Rfl: 11   linaclotide (LINZESS) 72 MCG capsule, Take 72 mcg by mouth daily before breakfast., Disp: , Rfl:    Melatonin 10 MG TABS, Take 10 mg by mouth at bedtime., Disp: , Rfl:    metoprolol (LOPRESSOR) 50 MG tablet, Take 50 mg by mouth 2 (two) times daily., Disp: , Rfl:    montelukast (SINGULAIR) 10 MG tablet, Take 10 mg by mouth at bedtime. , Disp: , Rfl:    Multiple Vitamin (MULTIVITAMIN WITH MINERALS) TABS tablet, Take 1 tablet by mouth daily., Disp: , Rfl:    naloxone (NARCAN) 2 MG/2ML injection, Place 1 mg into the nose as directed., Disp: , Rfl:    octreotide (SANDOSTATIN LAR) 10 MG injection, Inject 10 mg into the muscle every 28 (twenty-eight) days., Disp: , Rfl:    oxyCODONE-acetaminophen (PERCOCET/ROXICET) 5-325 MG tablet, oxycodone-acetaminophen 5 mg-325 mg tablet, Disp: ,  Rfl:    pantoprazole (PROTONIX) 40 MG tablet, Take 40 mg by mouth at bedtime. , Disp: , Rfl:    polyethylene glycol powder (GLYCOLAX/MIRALAX) 17 GM/SCOOP powder, Take 17 g by mouth daily as needed for mild constipation or moderate constipation., Disp: , Rfl:    prochlorperazine (COMPAZINE) 5 MG tablet, Take 5 mg by mouth every 6 (six) hours as needed for nausea or vomiting., Disp: , Rfl:    rOPINIRole (REQUIP) 0.25 MG tablet, Take 0.75 mg by mouth 3 (three) times daily. , Disp: , Rfl:    tiZANidine (ZANAFLEX) 2 MG tablet, Take 2 mg by mouth 2 (two) times daily as needed., Disp: , Rfl:   PAST MEDICAL HISTORY: Past Medical History:  Diagnosis Date   Accessory spleen    Arthritis    multiple areas of her body   Asthma    Cancer ([place])    located on her aorta and small intestines- on Octreodtide 24m IM every 28 days for this   Depression    Diabetes mellitus    diet controlled   Dyspnea    today 11/09/2017, reports that her breathing is normal for her    Dysrhythmia    hx rapid heart beat   Fibromyalgia    GERD (gastroesophageal reflux disease)     History of hiatal hernia    Hyperlipidemia    Hypertension    Myalgia and myositis, unspecified    Polyneuropathy    Sleep apnea    no cpap used, pt does not like, use to use CPAP but due to insurance had to return the device    Weight loss 02/25/2011    PAST SURGICAL HISTORY: Past Surgical History:  Procedure Laterality Date   ABDOMINAL HYSTERECTOMY  yrs ago   ovaries remain, done because of endometriosis   ANTERIOR CERVICAL DECOMP/DISCECTOMY FUSION N/A 11/16/2017   Procedure: Anterior Cervical Decompression/Discectomy Fusion - Cervical four-Cervical five - Cervical five-Cervical six - Cervical six-Cervical seven;  Surgeon: JEustace Moore MD;  [facility]  Service: Neurosurgery;  Laterality: N/A;   BACK SURGERY  yrs ago   lower back   BREAST BIOPSY Bilateral    benign biopsies   BREAST LUMPECTOMY     both breast   CARDIAC CATHETERIZATION     CARPAL TUNNEL RELEASE Right 2003   CARPAL TUNNEL RELEASE Left 02/12/2020   Procedure: CARPAL TUNNEL RELEASE;  Surgeon: HCarole Civil MD;  Location: [facility];  Service: Orthopedics;  Laterality: Left;   CHOLECYSTECTOMY  yrs ago   COLONOSCOPY  06/2012   normal   ESOPHAGEAL DILATION  06/14/2012   Procedure: ESOPHAGEAL DILATION;  Surgeon: RDaneil Dolin MD;  Location: [facility];  Service: Endoscopy;;   EUS  07/13/2012   Procedure: UPPER ENDOSCOPIC ULTRASOUND (EUS) LINEAR;  Surgeon: DMilus Banister MD;  Location: [facility];  Service: Endoscopy;  Laterality: N/A;   NOSE SURGERY  yrs ago   ROTATOR CUFF REPAIR  yrs ago   left    FAMILY HISTORY: Family History  Problem Relation Age of Onset   Cancer Mother        died age 72 ?not sure what kind, metastatic at time of diagnosis   Colon cancer Father    Colon cancer Maternal Aunt    Colon cancer Maternal Uncle     SOCIAL HISTORY:  Social History   Socioeconomic History   Marital status: Divorced    Spouse name: Not on file   Number of children: 1  Years of  education: Some college   Highest education level: Some college, no degree  Occupational History   Occupation: Retired  Tobacco Use   Smoking status: Former    Packs/day: 0.50    Years: 20.00    Pack years: 10.00    Types: Cigarettes    Quit date: 09/13/1992    Years since quitting: 29.4   Smokeless tobacco: Never   Tobacco comments:    quit about 25 + years ago  Vaping Use   Vaping Use: Never used  Substance and Sexual Activity   Alcohol use: No   Drug use: No   Sexual activity: Yes  Other Topics Concern   Not on file  Social History Narrative   Lives alone   Caffeine use: rare   Right handed    Social Determinants of Health   Financial Resource Strain: Not on file  Food Insecurity: Not on file  Transportation Needs: Not on file  Physical Activity: Not on file  Stress: Not on file  Social Connections: Not on file  Intimate Partner Violence: Not on file     PHYSICAL EXAM  Vitals:   02/09/22 1015  BP: 131/77  Pulse: 62  Weight: 180 lb (81.6 kg)  Height: _0  (1.651 m)     Body mass index is 29.95 kg/m.   General: The patient is well-developed and well-nourished and in no acute distress.   ACDF scar on neck.  Ok ROM  HEENT:  Head is [place]/AT.  Sclera are anicteric.    Skin: Extremities are without rash or  edema.  Musculoskeletal:  Back is nontender  Neurologic Exam  Mental status: The patient is alert and oriented x 3 at the time of the examination. The patient has apparent normal recent and remote memory, with an apparently normal attention span and concentration ability.   Speech is normal.  Cranial nerves: Extraocular movements are full. Facial strength and sensation are normal.  . No obvious hearing deficits are noted.  Motor:  Muscle bulk is normal.   Tone is normal. Strength is  5 / 5 except  4+ EHL   Sensory: Sensory testing is intact to pinprick, soft touch and vibration sensation in the arms and proximal legs. Reduced vibration and PP  sensation at toes  Coordination: Cerebellar testing reveals good finger-nose-finger and heel-to-shin bilaterally.  Gait and station: Station is normal.   Gait is normal. Tandem gait is mildly wide. Romberg is negative.   Reflexes: Deep tendon reflexes are symmetric and normal bilaterally.       ASSESSMENT AND PLAN  Cervical stenosis of spine - Plan: MR CERVICAL SPINE WO CONTRAST  Cervical vertebral fusion - Plan: MR CERVICAL SPINE WO CONTRAST  Propriospinal myoclonus - Plan: MR CERVICAL SPINE WO CONTRAST  Polyneuropathy - Plan: Multiple Myeloma Panel (SPEP&IFE w/QIG)  Dysesthesia  Type 2 diabetes mellitus with diabetic polyneuropathy, without long-term current use of insulin (HCC)    Trial of Keppra for jerks (unsure etiology - ? Propriospinal myoclonus as h/o cervical stenosis).  May help dysesthesias some.  Check Cervical spine MRI If dysesthesias worsen, consider adding VImpat Rtc 6 months or sooner if new or worsening symptoms.    42-minute office visit with the majority of the time spent face-to-face for history and physical, discussion/counseling and decision-making.  Additional time with record review and documentation.   Rodell Marrs A. Felecia Shelling, MD, [facility] 05/09/785, 75:44 AM Certified in Neurology, Clinical Neurophysiology, Sleep Medicine and Neuroimaging  [facility] [address]  [place], [place] [address] [phone]

## 2022-02-10 ENCOUNTER — Telehealth: Payer: Self-pay | Admitting: Neurology

## 2022-02-10 NOTE — Telephone Encounter (Signed)
[employer] medicare Pamela Hatfield: [phone] exp. 02/10/22-03/12/22 sent to [facility] they will call the patient to schedule

## 2022-02-13 LAB — MULTIPLE MYELOMA PANEL, SERUM
Albumin SerPl Elph-Mcnc: 3.2 g/dL (ref 2.9–4.4)
Albumin/Glob SerPl: 0.9 (ref 0.7–1.7)
Alpha 1: 0.3 g/dL (ref 0.0–0.4)
Alpha2 Glob SerPl Elph-Mcnc: 0.9 g/dL (ref 0.4–1.0)
B-Globulin SerPl Elph-Mcnc: 1.2 g/dL (ref 0.7–1.3)
Gamma Glob SerPl Elph-Mcnc: 1.6 g/dL (ref 0.4–1.8)
Globulin, Total: 4 g/dL — ABNORMAL HIGH (ref 2.2–3.9)
IgA/Immunoglobulin A, Serum: 664 mg/dL — ABNORMAL HIGH (ref 64–422)
IgG (Immunoglobin G), Serum: 1668 mg/dL — ABNORMAL HIGH (ref 586–1602)
IgM (Immunoglobulin M), Srm: 114 mg/dL (ref 26–217)
Total Protein: 7.2 g/dL (ref 6.0–8.5)

## 2022-02-15 DIAGNOSIS — K668 Other specified disorders of peritoneum: Secondary | ICD-10-CM | POA: Diagnosis not present

## 2022-02-15 DIAGNOSIS — K869 Disease of pancreas, unspecified: Secondary | ICD-10-CM | POA: Diagnosis not present

## 2022-02-15 DIAGNOSIS — K8689 Other specified diseases of pancreas: Secondary | ICD-10-CM | POA: Diagnosis not present

## 2022-02-15 DIAGNOSIS — R590 Localized enlarged lymph nodes: Secondary | ICD-10-CM | POA: Diagnosis not present

## 2022-02-15 DIAGNOSIS — R918 Other nonspecific abnormal finding of lung field: Secondary | ICD-10-CM | POA: Diagnosis not present

## 2022-02-15 DIAGNOSIS — K6389 Other specified diseases of intestine: Secondary | ICD-10-CM | POA: Diagnosis not present

## 2022-02-15 DIAGNOSIS — J90 Pleural effusion, not elsewhere classified: Secondary | ICD-10-CM | POA: Diagnosis not present

## 2022-02-15 DIAGNOSIS — D3A029 Benign carcinoid tumor of the large intestine, unspecified portion: Secondary | ICD-10-CM | POA: Diagnosis not present

## 2022-02-19 DIAGNOSIS — M79604 Pain in right leg: Secondary | ICD-10-CM | POA: Diagnosis not present

## 2022-02-19 DIAGNOSIS — R208 Other disturbances of skin sensation: Secondary | ICD-10-CM | POA: Diagnosis not present

## 2022-02-19 DIAGNOSIS — K581 Irritable bowel syndrome with constipation: Secondary | ICD-10-CM | POA: Diagnosis not present

## 2022-02-19 DIAGNOSIS — M79605 Pain in left leg: Secondary | ICD-10-CM | POA: Diagnosis not present

## 2022-02-19 DIAGNOSIS — E1142 Type 2 diabetes mellitus with diabetic polyneuropathy: Secondary | ICD-10-CM | POA: Diagnosis not present

## 2022-02-19 DIAGNOSIS — M4802 Spinal stenosis, cervical region: Secondary | ICD-10-CM | POA: Diagnosis not present

## 2022-02-19 DIAGNOSIS — R600 Localized edema: Secondary | ICD-10-CM | POA: Diagnosis not present

## 2022-02-19 DIAGNOSIS — G894 Chronic pain syndrome: Secondary | ICD-10-CM | POA: Diagnosis not present

## 2022-02-19 DIAGNOSIS — G253 Myoclonus: Secondary | ICD-10-CM | POA: Diagnosis not present

## 2022-02-23 ENCOUNTER — Telehealth: Payer: Self-pay | Admitting: *Deleted

## 2022-02-23 ENCOUNTER — Other Ambulatory Visit (HOSPITAL_COMMUNITY): Payer: Self-pay | Admitting: Internal Medicine

## 2022-02-23 DIAGNOSIS — Z1231 Encounter for screening mammogram for malignant neoplasm of breast: Secondary | ICD-10-CM

## 2022-02-23 DIAGNOSIS — D3A029 Benign carcinoid tumor of the large intestine, unspecified portion: Secondary | ICD-10-CM | POA: Diagnosis not present

## 2022-02-23 NOTE — Telephone Encounter (Signed)
Pt left VM inquiring about scheduling colonoscopy. Fowarding to [employer]

## 2022-03-01 ENCOUNTER — Ambulatory Visit (HOSPITAL_COMMUNITY)
Admission: RE | Admit: 2022-03-01 | Discharge: 2022-03-01 | Disposition: A | Discharge status: Home / Self Care | Payer: Medicare PPO | Source: Ambulatory Visit | Attending: Neurology | Admitting: Neurology

## 2022-03-01 DIAGNOSIS — M47812 Spondylosis without myelopathy or radiculopathy, cervical region: Secondary | ICD-10-CM | POA: Diagnosis not present

## 2022-03-01 DIAGNOSIS — G253 Myoclonus: Secondary | ICD-10-CM | POA: Diagnosis not present

## 2022-03-01 DIAGNOSIS — M4802 Spinal stenosis, cervical region: Secondary | ICD-10-CM | POA: Insufficient documentation

## 2022-03-01 DIAGNOSIS — M4322 Fusion of spine, cervical region: Secondary | ICD-10-CM | POA: Insufficient documentation

## 2022-03-01 DIAGNOSIS — R200 Anesthesia of skin: Secondary | ICD-10-CM | POA: Diagnosis not present

## 2022-03-02 ENCOUNTER — Telehealth: Payer: Self-pay | Admitting: Neurology

## 2022-03-02 ENCOUNTER — Telehealth: Payer: Self-pay | Admitting: *Deleted

## 2022-03-02 DIAGNOSIS — G992 Myelopathy in diseases classified elsewhere: Secondary | ICD-10-CM

## 2022-03-02 NOTE — Telephone Encounter (Signed)
Took call from phone staff and spoke w/ Diane at [facility]. She called about STAT results from MRI cervical completed on 03/01/22.  Wanted to bring section to MD attention under impression: "At C3-C4, there is new moderate degenerative endplate edema. Progressive multifactorial severe spinal canal stenosis with spinal cord impingement. T2 hyperintense signal abnormality within the spinal cord at this level may reflect edema and/or myelomalacia. Progressive severe bilateral neural foraminal narrowing"   Advised I will send to MD for review.

## 2022-03-02 NOTE — Telephone Encounter (Signed)
The MRI of the cervical spine shows severe stenosis at C3-C4 (the level above her C4-C7 ACDF).  There appears to be subtle myelopathic signal within the spinal cord.  I called and got voicemail.  I will try to reach her again soon.  Due to the findings she would need to be seen by neurosurgery and will likely need extension of the fusion.

## 2022-03-03 ENCOUNTER — Ambulatory Visit (HOSPITAL_COMMUNITY)
Admission: RE | Admit: 2022-03-03 | Discharge: 2022-03-03 | Disposition: A | Discharge status: Home / Self Care | Payer: Medicare PPO | Source: Ambulatory Visit | Attending: Internal Medicine | Admitting: Internal Medicine

## 2022-03-03 ENCOUNTER — Ambulatory Visit (HOSPITAL_COMMUNITY)
Admission: RE | Admit: 2022-03-03 | Discharge: 2022-03-03 | Disposition: A | Discharge status: Home / Self Care | Payer: Medicare PPO | Source: Ambulatory Visit | Attending: Family Medicine | Admitting: Family Medicine

## 2022-03-03 DIAGNOSIS — M810 Age-related osteoporosis without current pathological fracture: Secondary | ICD-10-CM | POA: Diagnosis not present

## 2022-03-03 DIAGNOSIS — Z7982 Long term (current) use of aspirin: Secondary | ICD-10-CM | POA: Diagnosis not present

## 2022-03-03 DIAGNOSIS — Z1231 Encounter for screening mammogram for malignant neoplasm of breast: Secondary | ICD-10-CM | POA: Insufficient documentation

## 2022-03-03 DIAGNOSIS — Z9889 Other specified postprocedural states: Secondary | ICD-10-CM | POA: Diagnosis not present

## 2022-03-03 DIAGNOSIS — Z780 Asymptomatic menopausal state: Secondary | ICD-10-CM | POA: Insufficient documentation

## 2022-03-03 DIAGNOSIS — M85832 Other specified disorders of bone density and structure, left forearm: Secondary | ICD-10-CM | POA: Diagnosis not present

## 2022-03-03 DIAGNOSIS — Z90711 Acquired absence of uterus with remaining cervical stump: Secondary | ICD-10-CM | POA: Diagnosis not present

## 2022-03-03 DIAGNOSIS — Z8781 Personal history of (healed) traumatic fracture: Secondary | ICD-10-CM | POA: Insufficient documentation

## 2022-03-03 DIAGNOSIS — Z79899 Other long term (current) drug therapy: Secondary | ICD-10-CM | POA: Insufficient documentation

## 2022-03-03 DIAGNOSIS — Z13820 Encounter for screening for osteoporosis: Secondary | ICD-10-CM | POA: Insufficient documentation

## 2022-03-03 NOTE — Telephone Encounter (Signed)
Lmovm to call back 

## 2022-03-03 NOTE — Telephone Encounter (Signed)
I tried to reach her again regarding the MRI of the cervical spine results.  I will try to reach her again later this afternoon.

## 2022-03-03 NOTE — Telephone Encounter (Signed)
I called [phone] again.   No answer and LM.  Also called [phone] - no answer

## 2022-03-03 NOTE — Telephone Encounter (Signed)
Hopefully someone let patient know I was on vacation and that's why she has had no response!  I had been waiting for her to see cardiology and looks like she completed CT coronary and ECHO last month with no significant findings.      Proceed with ileocolonoscopy with Dr. Gala Romney.  ASA 3 Diagnosis: Family history of colon cancer (father at unknown age) and several second-degree relatives with colon cancer.  Daughter with adenomatous colon polyps in her 78s, possibly with carcinoma in situ undergoing EMR per patient.  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  Typically on Linzess 72 mcg daily.  She was given samples of 145 and 290 mcg at office visit with instructions to let us know which ones worked best but we have not heard from her regarding this matter.  Lets make sure that she is having adequate regular bowel movements and continuing Linzess daily.  If she needs new prescription or patient assistance forms completed, she should let us know which dose she needs.

## 2022-03-04 ENCOUNTER — Telehealth: Payer: Self-pay | Admitting: Neurology

## 2022-03-04 NOTE — Telephone Encounter (Signed)
Sent to [facility] ph # [phone]

## 2022-03-04 NOTE — Telephone Encounter (Signed)
Pt called back. She was aware leslie was on vacation.  She states the linzess 181mg worked for her. Dr. HNevada Cranefilled out patient assistance and sent in but then she got a call that the paperwork was not filled out right and was sent back to Dr. HNevada Crane That she is aware nothing has been done since.  She also is scheduled for procedure 7/12 at 1:30pm. She stated she can't drink that gallon jug it will make her vomit. She requested low volume. She will come by office to pick up instructions/pre-op appt.   PA approved for procedure. Auth#  [id number] DOS: 03/24/2022 - 06/22/2022   Fowarding to leslie regarding linzess

## 2022-03-05 ENCOUNTER — Encounter: Payer: Self-pay | Admitting: *Deleted

## 2022-03-08 ENCOUNTER — Telehealth: Payer: Self-pay | Admitting: Neurology

## 2022-03-09 NOTE — Telephone Encounter (Signed)
I called patient again to discuss.  No answer, left a voicemail asking her to call us back.  We have tried multiple times to reach patient by phone.  We have tried to send her a MyChart message which has not been read.  I will mail her a letter asking [place] to call us back.  If patient calls back please route the call to POD 1.

## 2022-03-11 DIAGNOSIS — Z6829 Body mass index (BMI) 29.0-29.9, adult: Secondary | ICD-10-CM | POA: Diagnosis not present

## 2022-03-11 DIAGNOSIS — G959 Disease of spinal cord, unspecified: Secondary | ICD-10-CM | POA: Diagnosis not present

## 2022-03-15 NOTE — Telephone Encounter (Signed)
Called pt back. Relayed results. She states she already heard from neurosurgery/ Dr. Ronnald Ramp who is recommending surgery. She is waiting to schedule a surgery date. She will call back if anything further is needed.

## 2022-03-15 NOTE — Telephone Encounter (Signed)
Pt returned phone call, would like a call back.  

## 2022-03-17 ENCOUNTER — Other Ambulatory Visit: Payer: Self-pay | Admitting: Neurological Surgery

## 2022-03-18 NOTE — Patient Instructions (Signed)
Pamela Hatfield  03/18/2022     '@PREFPERIOPPHARMACY'$ @   Your procedure is scheduled on  03/24/2022.   Report to [facility] at  1130  A.M.   Call this number if you have problems the morning of surgery:  [phone]   Remember:  Follow the diet and prep instructions given to you by the office.    DO NOT take any medications for diabetes the morning of your procedure.    Use your inhaler before you come and bring your rescue inhaler with you.     Take these medicines the morning of surgery with A SIP OF WATER           subutex, zyrtec, cymbalta, gabapentin, humibid, keppra, metoprolol, zofran (if needed), percocet(if needed), protonix.     Do not wear jewelry, make-up or nail polish.  Do not wear lotions, powders, or perfumes, or deodorant.  Do not shave 48 hours prior to surgery.  Men may shave face and neck.  Do not bring valuables to the hospital.  [facility] is not responsible for any belongings or valuables.  Contacts, dentures or bridgework may not be worn into surgery.  Leave your suitcase in the car.  After surgery it may be brought to your room.  For patients admitted to the hospital, discharge time will be determined by your treatment team.  Patients discharged the day of surgery will not be allowed to drive home and must have someone with them for 24 hours.    Special instructions:   DO NOT smoke tobacco or vape for 24 hours before your procedure.  Please read over the following fact sheets that you were given. Anesthesia Post-op Instructions and Care and Recovery After Surgery      Colonoscopy, Adult, Care After The following information offers guidance on how to care for yourself after your procedure. Your health care provider may also give you more specific instructions. If you have problems or questions, contact your health care provider. What can I expect after the procedure? After the procedure, it is common to have: A small amount of blood  in your stool for 24 hours after the procedure. Some gas. Mild cramping or bloating of your abdomen. Follow these instructions at home: Eating and drinking  Drink enough fluid to keep your urine pale yellow. Follow instructions from your health care provider about eating or drinking restrictions. Resume your normal diet as told by your health care provider. Avoid heavy or fried foods that are hard to digest. Activity Rest as told by your health care provider. Avoid sitting for a long time without moving. Get up to take short walks every 1-2 hours. This is important to improve blood flow and breathing. Ask for help if you feel weak or unsteady. Return to your normal activities as told by your health care provider. Ask your health care provider what activities are safe for you. Managing cramping and bloating  Try walking around when you have cramps or feel bloated. If directed, apply heat to your abdomen as told by your health care provider. Use the heat source that your health care provider recommends, such as a moist heat pack or a heating pad. Place a towel between your skin and the heat source. Leave the heat on for 20-30 minutes. Remove the heat if your skin turns bright red. This is especially important if you are unable to feel pain, heat, or cold. You have a greater risk of getting burned.  General instructions If you were given a sedative during the procedure, it can affect you for several hours. Do not drive or operate machinery until your health care provider says that it is safe. For the first 24 hours after the procedure: Do not sign important documents. Do not drink alcohol. Do your regular daily activities at a slower pace than normal. Eat soft foods that are easy to digest. Take over-the-counter and prescription medicines only as told by your health care provider. Keep all follow-up visits. This is important. Contact a health care provider if: You have blood in your stool  2-3 days after the procedure. Get help right away if: You have more than a small spotting of blood in your stool. You have large blood clots in your stool. You have swelling of your abdomen. You have nausea or vomiting. You have a fever. You have increasing pain in your abdomen that is not relieved with medicine. These symptoms may be an emergency. Get help right away. Call 911. Do not wait to see if the symptoms will go away. Do not drive yourself to the hospital. Summary After the procedure, it is common to have a small amount of blood in your stool. You may also have mild cramping and bloating of your abdomen. If you were given a sedative during the procedure, it can affect you for several hours. Do not drive or operate machinery until your health care provider says that it is safe. Get help right away if you have a lot of blood in your stool, nausea or vomiting, a fever, or increased pain in your abdomen. This information is not intended to replace advice given to you by your health care provider. Make sure you discuss any questions you have with your health care provider. Document Revised: 04/22/2021 Document Reviewed: 04/22/2021 Elsevier Patient Education  [place] After This sheet gives you information about how to care for yourself after your procedure. Your health care provider may also give you more specific instructions. If you have problems or questions, contact your health care provider. What can I expect after the procedure? After the procedure, it is common to have: Tiredness. Forgetfulness about what happened after the procedure. Impaired judgment for important decisions. Nausea or vomiting. Some difficulty with balance. Follow these instructions at home: For the time period you were told by your health care provider:     Rest as needed. Do not participate in activities where you could fall or become injured. Do not drive or  use machinery. Do not drink alcohol. Do not take sleeping pills or medicines that cause drowsiness. Do not make important decisions or sign legal documents. Do not take care of children on your own. Eating and drinking Follow the diet that is recommended by your health care provider. Drink enough fluid to keep your urine pale yellow. If you vomit: Drink water, juice, or soup when you can drink without vomiting. Make sure you have little or no nausea before eating solid foods. General instructions Have a responsible adult stay with you for the time you are told. It is important to have someone help care for you until you are awake and alert. Take over-the-counter and prescription medicines only as told by your health care provider. If you have sleep apnea, surgery and certain medicines can increase your risk for breathing problems. Follow instructions from your health care provider about wearing your sleep device: Anytime you are sleeping, including during daytime naps. While taking  prescription pain medicines, sleeping medicines, or medicines that make you drowsy. Avoid smoking. Keep all follow-up visits as told by your health care provider. This is important. Contact a health care provider if: You keep feeling nauseous or you keep vomiting. You feel light-headed. You are still sleepy or having trouble with balance after 24 hours. You develop a rash. You have a fever. You have redness or swelling around the IV site. Get help right away if: You have trouble breathing. You have new-onset confusion at home. Summary For several hours after your procedure, you may feel tired. You may also be forgetful and have poor judgment. Have a responsible adult stay with you for the time you are told. It is important to have someone help care for you until you are awake and alert. Rest as told. Do not drive or operate machinery. Do not drink alcohol or take sleeping pills. Get help right away if you  have trouble breathing, or if you suddenly become confused. This information is not intended to replace advice given to you by your health care provider. Make sure you discuss any questions you have with your health care provider. Document Revised: 08/04/2021 Document Reviewed: 08/02/2019 Elsevier Patient Education  [place].

## 2022-03-19 ENCOUNTER — Encounter (HOSPITAL_COMMUNITY)
Admission: RE | Admit: 2022-03-19 | Discharge: 2022-03-19 | Disposition: A | Discharge status: Home / Self Care | Payer: Medicare PPO | Source: Ambulatory Visit | Attending: Internal Medicine | Admitting: Internal Medicine

## 2022-03-19 NOTE — Pre-Procedure Instructions (Signed)
Mindy Estudillo at [facility] message to notify her that patient did nt show for pre-op.

## 2022-03-24 ENCOUNTER — Encounter (HOSPITAL_COMMUNITY)
Admission: RE | Disposition: A | Discharge status: Home / Self Care | Payer: Self-pay | Source: Home / Self Care | Attending: Internal Medicine

## 2022-03-24 ENCOUNTER — Ambulatory Visit (HOSPITAL_COMMUNITY)
Admission: RE | Admit: 2022-03-24 | Discharge: 2022-03-24 | Disposition: A | Discharge status: Home / Self Care | Payer: Medicare PPO | Attending: Internal Medicine | Admitting: Internal Medicine

## 2022-03-24 ENCOUNTER — Encounter (HOSPITAL_COMMUNITY): Payer: Self-pay | Admitting: Internal Medicine

## 2022-03-24 ENCOUNTER — Ambulatory Visit (HOSPITAL_COMMUNITY): Payer: Medicare PPO | Admitting: Certified Registered"

## 2022-03-24 ENCOUNTER — Ambulatory Visit (HOSPITAL_BASED_OUTPATIENT_CLINIC_OR_DEPARTMENT_OTHER): Payer: Medicare PPO | Admitting: Certified Registered"

## 2022-03-24 DIAGNOSIS — Z8371 Family history of colonic polyps: Secondary | ICD-10-CM | POA: Insufficient documentation

## 2022-03-24 DIAGNOSIS — Z800 Family history of malignant neoplasm of digestive organs: Secondary | ICD-10-CM

## 2022-03-24 DIAGNOSIS — Z1211 Encounter for screening for malignant neoplasm of colon: Secondary | ICD-10-CM | POA: Insufficient documentation

## 2022-03-24 DIAGNOSIS — I10 Essential (primary) hypertension: Secondary | ICD-10-CM | POA: Diagnosis not present

## 2022-03-24 DIAGNOSIS — Z79899 Other long term (current) drug therapy: Secondary | ICD-10-CM | POA: Diagnosis not present

## 2022-03-24 DIAGNOSIS — Z87891 Personal history of nicotine dependence: Secondary | ICD-10-CM | POA: Insufficient documentation

## 2022-03-24 DIAGNOSIS — J45909 Unspecified asthma, uncomplicated: Secondary | ICD-10-CM | POA: Diagnosis not present

## 2022-03-24 DIAGNOSIS — G4730 Sleep apnea, unspecified: Secondary | ICD-10-CM | POA: Insufficient documentation

## 2022-03-24 DIAGNOSIS — K219 Gastro-esophageal reflux disease without esophagitis: Secondary | ICD-10-CM | POA: Diagnosis not present

## 2022-03-24 DIAGNOSIS — Z538 Procedure and treatment not carried out for other reasons: Secondary | ICD-10-CM

## 2022-03-24 DIAGNOSIS — E119 Type 2 diabetes mellitus without complications: Secondary | ICD-10-CM | POA: Diagnosis not present

## 2022-03-24 DIAGNOSIS — M1990 Unspecified osteoarthritis, unspecified site: Secondary | ICD-10-CM | POA: Insufficient documentation

## 2022-03-24 DIAGNOSIS — M797 Fibromyalgia: Secondary | ICD-10-CM | POA: Insufficient documentation

## 2022-03-24 HISTORY — PX: FLEXIBLE SIGMOIDOSCOPY: SHX5431

## 2022-03-24 SURGERY — SIGMOIDOSCOPY, FLEXIBLE
Anesthesia: General

## 2022-03-24 MED ORDER — PROPOFOL 500 MG/50ML IV EMUL
INTRAVENOUS | Status: DC | PRN
  Administered 2022-03-24: 200 ug/kg/min via INTRAVENOUS

## 2022-03-24 MED ORDER — LIDOCAINE 2% (20 MG/ML) 5 ML SYRINGE
INTRAMUSCULAR | Status: DC | PRN
  Administered 2022-03-24: 50 mg via INTRAVENOUS

## 2022-03-24 MED ORDER — PROPOFOL 10 MG/ML IV BOLUS
INTRAVENOUS | Status: DC | PRN
  Administered 2022-03-24: 30 mg via INTRAVENOUS
  Administered 2022-03-24: 80 mg via INTRAVENOUS

## 2022-03-24 MED ORDER — LACTATED RINGERS IV SOLN: INTRAVENOUS | Status: DC

## 2022-03-24 NOTE — Discharge Instructions (Signed)
  Colonoscopy Discharge Instructions  Read the instructions outlined below and refer to this sheet in the next few weeks. These discharge instructions provide you with general information on caring for yourself after you leave the hospital. Your doctor may also give you specific instructions. While your treatment has been planned according to the most current medical practices available, unavoidable complications occasionally occur. If you have any problems or questions after discharge, call Dr. Gala Romney at [phone]. ACTIVITY You may resume your regular activity, but move at a slower pace for the next 24 hours.  Take frequent rest periods for the next 24 hours.  Walking will help get rid of the air and reduce the bloated feeling in your belly (abdomen).  No driving for 24 hours (because of the medicine (anesthesia) used during the test).   Do not sign any important legal documents or operate any machinery for 24 hours (because of the anesthesia used during the test).  NUTRITION Drink plenty of fluids.  You may resume your normal diet as instructed by your doctor.  Begin with a light meal and progress to your normal diet. Heavy or fried foods are harder to digest and may make you feel sick to your stomach (nauseated).  Avoid alcoholic beverages for 24 hours or as instructed.  MEDICATIONS You may resume your normal medications unless your doctor tells you otherwise.  WHAT YOU CAN EXPECT TODAY Some feelings of bloating in the abdomen.  Passage of more gas than usual.  Spotting of blood in your stool or on the toilet paper.  IF YOU HAD POLYPS REMOVED DURING THE COLONOSCOPY: No aspirin products for 7 days or as instructed.  No alcohol for 7 days or as instructed.  Eat a soft diet for the next 24 hours.  FINDING OUT THE RESULTS OF YOUR TEST Not all test results are available during your visit. If your test results are not back during the visit, make an appointment with your caregiver to find out the  results. Do not assume everything is normal if you have not heard from your caregiver or the medical facility. It is important for you to follow up on all of your test results.  SEEK IMMEDIATE MEDICAL ATTENTION IF: You have more than a spotting of blood in your stool.  Your belly is swollen (abdominal distention).  You are nauseated or vomiting.  You have a temperature over 101.  You have abdominal pain or discomfort that is severe or gets worse throughout the day.     Your colonoscopy could not be completed today because you are not adequately prepared.    We will schedule a follow-up appointment with Neil Crouch in the office to reschedule   at patient request, I called Ferne Reus at [phone]

## 2022-03-24 NOTE — Anesthesia Postprocedure Evaluation (Signed)
Anesthesia Post Note  Patient: Pamela Hatfield  Procedure(s) Performed: [place]  Patient location during evaluation: Phase II Anesthesia Type: General Level of consciousness: awake and alert Pain management: pain level controlled Vital Signs Assessment: post-procedure vital signs reviewed and stable Respiratory status: spontaneous breathing, nonlabored ventilation, respiratory function stable and patient connected to nasal cannula oxygen Cardiovascular status: blood pressure returned to baseline and stable Postop Assessment: no apparent nausea or vomiting Anesthetic complications: no   There were no known notable events for this encounter.   Last Vitals:  Vitals:   03/24/22 1324 03/24/22 1453  BP: 137/72 (!) 92/52  Pulse: 76   Resp: (!) 21 13  Temp: 36.9 C 36.8 C  SpO2: 100% 95%    Last Pain:  Vitals:   03/24/22 1453  TempSrc: Oral  PainSc: 0-No pain                 Trixie Rude

## 2022-03-24 NOTE — Anesthesia Preprocedure Evaluation (Signed)
Anesthesia Evaluation  Patient identified by MRN, date of birth, ID band Patient awake    Reviewed: Allergy & Precautions, NPO status , Patient's Chart, lab work & pertinent test results, reviewed documented beta blocker date and time   Airway Mallampati: III  TM Distance: >3 FB Neck ROM: Full   Comment: ACDF Dental  (+) Missing, Dental Advisory Given   Pulmonary shortness of breath and with exertion, asthma , sleep apnea , former smoker,    Pulmonary exam normal breath sounds clear to auscultation       Cardiovascular hypertension, Pt. on medications and Pt. on home beta blockers Normal cardiovascular exam+ dysrhythmias  Rhythm:Regular Rate:Normal     Neuro/Psych PSYCHIATRIC DISORDERS Depression negative neurological ROS     GI/Hepatic Neg liver ROS, hiatal hernia, GERD  Medicated and Controlled,  Endo/Other  negative endocrine ROSdiabetes, Well Controlled, Type 2  Renal/GU negative Renal ROS     Musculoskeletal  (+) Arthritis , Fibromyalgia -ACDF   Abdominal   Peds  Hematology negative hematology ROS (+)   Anesthesia Other Findings   Reproductive/Obstetrics                             Anesthesia Physical  Anesthesia Plan  ASA: 3  Anesthesia Plan: General   Post-op Pain Management:    Induction: Intravenous  PONV Risk Score and Plan: 2 and TIVA and Propofol infusion  Airway Management Planned: Nasal Cannula and Natural Airway  Additional Equipment:   Intra-op Plan:   Post-operative Plan:   Informed Consent: I have reviewed the patients History and Physical, chart, labs and discussed the procedure including the risks, benefits and alternatives for the proposed anesthesia with the patient or authorized representative who has indicated his/her understanding and acceptance.       Plan Discussed with: CRNA  Anesthesia Plan Comments:         Anesthesia Quick  Evaluation

## 2022-03-24 NOTE — Transfer of Care (Signed)
Immediate Anesthesia Transfer of Care Note  Patient: Pamela Hatfield  Procedure(s) Performed: FLEXIBLE SIGMOIDOSCOPY  Patient Location: Short Stay  Anesthesia Type:MAC  Level of Consciousness: awake, alert , oriented and patient cooperative  Airway & Oxygen Therapy: Patient Spontanous Breathing  Post-op Assessment: Report given to RN, Post -op Vital signs reviewed and stable and Patient moving all extremities  Post vital signs: Reviewed and stable  Last Vitals:  Vitals Value Taken Time  BP    Temp    Pulse    Resp    SpO2      Last Pain:  Vitals:   03/24/22 1432  TempSrc:   PainSc: 8       Patients Stated Pain Goal: 7 (31/59/45 8592)  Complications: No notable events documented.

## 2022-03-24 NOTE — H&P (Addendum)
$'@LOGO'l$ @   Primary Care Physician:  Celene Squibb, MD Primary Gastroenterologist:  Dr. Gala Romney  Pre-Procedure History & Physical: HPI:  Pamela Hatfield is a 71 y.o. female here for High rescreening colonoscopy.  Multiple  First and second relatives with colon cancer and colon polyps.  Patient has history of possible ileal carcinoma with local metastatic disease although tissue has not been sampled for confirmation.  Chronic constipation managed with Linzess.  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- on Octreodtide '10mg'$  IM every 28 days for this   Depression    Diabetes mellitus    diet controlled   Dyspnea    today 11/09/2017, reports that her breathing is normal for her    Dysrhythmia    hx rapid heart beat   Fibromyalgia    GERD (gastroesophageal reflux disease)    History of hiatal hernia    Hyperlipidemia    Hypertension    Myalgia and myositis, unspecified    Polyneuropathy    Sleep apnea    no cpap used, pt does not like, use to use CPAP but due to insurance had to return the device    Weight loss 02/25/2011    Past Surgical History:  Procedure Laterality Date   ABDOMINAL HYSTERECTOMY  yrs ago   ovaries remain, done because of endometriosis   ANTERIOR CERVICAL DECOMP/DISCECTOMY FUSION N/A 11/16/2017   Procedure: Anterior Cervical Decompression/Discectomy Fusion - Cervical four-Cervical five - Cervical five-Cervical six - Cervical six-Cervical seven;  Surgeon: Eustace Moore, MD;  Location: [place];  Service: Neurosurgery;  Laterality: N/A;   BACK SURGERY  yrs ago   lower back   BREAST BIOPSY Bilateral    benign biopsies   BREAST LUMPECTOMY     both breast   CARDIAC CATHETERIZATION     CARPAL TUNNEL RELEASE Right 2003   CARPAL TUNNEL RELEASE Left 02/12/2020   Procedure: CARPAL TUNNEL RELEASE;  Surgeon: Carole Civil, MD;  Location:  [facility];  Service: Orthopedics;  Laterality: Left;   CHOLECYSTECTOMY  yrs ago   COLONOSCOPY  06/2012   normal   ESOPHAGEAL DILATION  06/14/2012   Procedure: ESOPHAGEAL DILATION;  Surgeon: Daneil Dolin, MD;  Location: [facility];  Service: Endoscopy;;   EUS  07/13/2012   Procedure: UPPER ENDOSCOPIC ULTRASOUND (EUS) LINEAR;  Surgeon: Milus Banister, MD;  Location: [facility];  Service: Endoscopy;  Laterality: N/A;   NOSE SURGERY  yrs ago   ROTATOR CUFF REPAIR  yrs ago   left    Prior to Admission medications   Medication Sig Start Date End Date Taking? Authorizing Provider  albuterol (VENTOLIN HFA) 108 (90 Base) MCG/ACT inhaler Inhale 2 puffs into the lungs every 6 (six) hours as needed for wheezing or shortness of breath.  01/08/20  Yes [provider]  aspirin EC 81 MG tablet Take 1 tablet (81 mg total) by mouth daily. Swallow whole. 01/13/22  Yes Early Osmond, MD  atorvastatin (LIPITOR) 40 MG tablet Take 1 tablet (40 mg total) by mouth daily. 01/13/22  Yes Early Osmond, MD  buprenorphine (SUBUTEX) 8 MG SUBL SL tablet Place 8 mg under the tongue 2 (two) times daily. 01/27/22  Yes [provider]  Cholecalciferol (VITAMIN D3 PO) Take 1 tablet by mouth daily.   Yes [provider]  cyclobenzaprine (FLEXERIL) 10 MG tablet Take 10 mg by mouth at bedtime as needed for muscle spasms.   Yes [provider]  DULoxetine (CYMBALTA) 30 MG capsule Take 30 mg by mouth daily. 12/02/21  Yes [provider]  enalapril (VASOTEC) 10 MG tablet Take 10 mg by mouth daily.    Yes [provider]  fluticasone (FLONASE) 50 MCG/ACT nasal spray Place 1-2 sprays into both nostrils daily as needed for allergies or rhinitis.   Yes [provider]  gabapentin (NEURONTIN) 600 MG tablet Take 600 mg by mouth 4 (four) times daily. 04/10/20  Yes [provider]  guaifenesin (HUMIBID E) 400 MG TABS tablet Take 400 mg by mouth in the morning and at  bedtime.   Yes [provider]  ibuprofen (ADVIL) 800 MG tablet Take 1 tablet (800 mg total) by mouth every 8 (eight) hours as needed. Patient taking differently: Take 800 mg by mouth as needed. 02/12/20  Yes Carole Civil, MD  levETIRAcetam (KEPPRA) 750 MG tablet Take 1 tablet (750 mg total) by mouth 2 (two) times daily. Patient taking differently: Take 750 mg by mouth daily. 02/09/22  Yes Sater, Nanine Means, MD  linaclotide (LINZESS) 145 MCG CAPS capsule Take 145 mcg by mouth daily before breakfast.   Yes [provider]  metoprolol (LOPRESSOR) 50 MG tablet Take 50 mg by mouth 2 (two) times daily.   Yes [provider]  montelukast (SINGULAIR) 10 MG tablet Take 10 mg by mouth at bedtime.    Yes [provider]  Multiple Vitamin (MULTIVITAMIN WITH MINERALS) TABS tablet Take 1 tablet by mouth daily.   Yes [provider]  Multiple Vitamins-Minerals (HAIR/SKIN/NAILS) CAPS Take 2 capsules by mouth daily.   Yes [provider]  ondansetron (ZOFRAN) 4 MG tablet Take 4 mg by mouth every 8 (eight) hours as needed for nausea or vomiting.   Yes [provider]  oxyCODONE-acetaminophen (PERCOCET/ROXICET) 5-325 MG tablet Take 1 tablet by mouth every 8 (eight) hours as needed for moderate pain.   Yes [provider]  pantoprazole (PROTONIX) 20 MG tablet Take 20 mg by mouth daily.   Yes [provider]  polyethylene glycol powder (GLYCOLAX/MIRALAX) 17 GM/SCOOP powder Take 17 g by mouth daily as needed for mild constipation or moderate constipation.   Yes [provider]  trolamine salicylate (BLUE-EMU HEMP) 10 % cream Apply 1 Application topically as needed for muscle pain.   Yes [provider]  cetirizine (ZYRTEC) 10 MG tablet Take 10 mg by mouth daily as needed for allergies.     [provider]  naloxone [facility]) 2 MG/2ML injection Place 1 mg into the nose once. 11/12/20   [provider]   octreotide (SANDOSTATIN LAR) 10 MG injection Inject 10 mg into the muscle every 28 (twenty-eight) days.    [provider]    Allergies as of 03/04/2022 - Review Complete 02/09/2022  Allergen Reaction Noted   Nickel Swelling 01/03/2018   Sulfa antibiotics Hives 03/31/2011   Clarithromycin Rash 03/31/2011   Codeine Itching and Other (See Comments) 03/31/2011   Lamotrigine Rash 01/23/2020   Tape Itching 06/01/2011    Family History  Problem Relation Age of Onset   Cancer Mother        died age 64, ?not sure what kind, metastatic at time of diagnosis   Colon cancer Father    Colon cancer Maternal Aunt    Colon cancer Maternal Uncle  Social History   Socioeconomic History   Marital status: Divorced    Spouse name: Not on file   Number of children: 1   Years of education: Some college   Highest education level: Some college, no degree  Occupational History   Occupation: Retired  Tobacco Use   Smoking status: Former    Packs/day: 0.50    Years: 20.00    Total pack years: 10.00    Types: Cigarettes    Quit date: 09/13/1992    Years since quitting: 29.5   Smokeless tobacco: Never   Tobacco comments:    quit about 25 + years ago  Vaping Use   Vaping Use: Never used  Substance and Sexual Activity   Alcohol use: No   Drug use: No   Sexual activity: Yes  Other Topics Concern   Not on file  Social History Narrative   Lives alone   Caffeine use: rare   Right handed    Social Determinants of Health   Financial Resource Strain: Not on file  Food Insecurity: Not on file  Transportation Needs: Not on file  Physical Activity: Not on file  Stress: Not on file  Social Connections: Not on file  Intimate Partner Violence: Not on file    Review of Systems: See HPI, otherwise negative ROS  Physical Exam: BP 137/72   Pulse 76   Temp 98.5 F (36.9 C) (Oral)   Resp (!) 21   SpO2 100%  General:   Alert,  Well-developed, well-nourished, pleasant and  cooperative in NAD Neck:  Supple; no masses or thyromegaly. No significant cervical adenopathy. Lungs:  Clear throughout to auscultation.   No wheezes, crackles, or rhonchi. No acute distress. Heart:  Regular rate and rhythm; no murmurs, clicks, rubs,  or gallops. Abdomen: Non-distended, normal bowel sounds.  Soft and nontender without appreciable mass or hepatosplenomegaly.  Pulses:  Normal pulses noted. Extremities:  Without clubbing or edema.  Impression/Plan:    71 year old lady presents here for high rescreening colonoscopy given positive family history of colon cancer.  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  Will attempt to assess the ileum to further evaluate her history.  I told patient with no guarantees I could find any such lesion in her ileum for biopsy, etc.  The risks, benefits, limitations, alternatives and imponderables have been reviewed with the patient. Questions have been answered. All parties are agreeable.       Notice: This dictation was prepared with Dragon dictation along with smaller phrase technology. Any transcriptional errors that result from this process are unintentional and may not be corrected upon review.

## 2022-03-24 NOTE — Op Note (Signed)
[facility] Patient Name: Pamela Hatfield Procedure Date: 03/24/2022 2:16 PM MRN: [id number] Date of Birth: 02-Sep-1951 Attending MD: Norvel Richards , MD CSN: [id number] Age: 71 Admit Type: Outpatient Procedure:                Sigmoidoscopy (attempted colonoscopy) Indications:              Screening in patient at increased risk: Family                            history of 1st-degree relative with colorectal                            cancer Providers:                Norvel Richards, MD, Caprice Kluver, Randa Spike, Technician, Everardo Pacific Referring MD:              Medicines:                Propofol per Anesthesia Complications:            No immediate complications. Estimated Blood Loss:     Estimated blood loss: none. Procedure:                Pre-Anesthesia Assessment:                           - Prior to the procedure, a History and Physical                            was performed, and patient medications and                            allergies were reviewed. The patient's tolerance of                            previous anesthesia was also reviewed. The risks                            and benefits of the procedure and the sedation                            options and risks were discussed with the patient.                            All questions were answered, and informed consent                            was obtained. Prior Anticoagulants: The patient has                            taken no previous anticoagulant or antiplatelet                            agents.  ASA Grade Assessment: III - A patient with                            severe systemic disease. After reviewing the risks                            and benefits, the patient was deemed in                            satisfactory condition to undergo the procedure.                           After obtaining informed consent, the colonoscope                            was  passed under direct vision. Throughout the                            procedure, the patient's blood pressure, pulse, and                            oxygen saturations were monitored continuously. The                            [id number] scope was introduced through the                            anus and advanced to the the sigmoid colon. Scope In: 2:37:07 PM Scope Out: 2:48:28 PM Total Procedure Duration: 0 hours 11 minutes 21 seconds  Findings:      Formed stool material was found in the rectum extended up in the       sigmoid. There was acute angulation in the sigmoid. I withdrew the adult       colonoscope and obtained a pediatric colonoscope and advanced on well       into the sigmoid again, solid stool elements were present in the lumen.       Subsequently, the procedure was terminated as the patient was not       adequately prep. Impression:               - Attempted colonoscopy/flex sig. Aborted secondary                            to inadequate preparation Moderate Sedation:      Moderate (conscious) sedation was personally administered by an       anesthesia professional. The following parameters were monitored: oxygen       saturation, heart rate, blood pressure, respiratory rate, EKG, adequacy       of pulmonary ventilation, and response to care. Recommendation:           - Patient has a contact number available for                            emergencies. The signs and symptoms of potential  delayed complications were discussed with the                            patient. Return to normal activities tomorrow.                            Written discharge instructions were provided to the                            patient.                           - Advance diet as tolerated. Patient to return to                            the office to get reassessed and reprepped for                            another attempted colonoscopy in the near  future. Procedure Code(s):        --- Professional ---                           [id number], 56, Colonoscopy, flexible; diagnostic,                            including collection of specimen(s) by brushing or                            washing, when performed (separate procedure) Diagnosis Code(s):        --- Professional ---                           Z80.0, Family history of malignant neoplasm of                            digestive organs CPT copyright 2019 [employer]. All rights reserved. The codes documented in this report are preliminary and upon coder review may  be revised to meet current compliance requirements. Cristopher Estimable. Matin Mattioli, MD Norvel Richards, MD 03/24/2022 3:05:59 PM This report has been signed electronically. Number of Addenda: 0

## 2022-03-29 ENCOUNTER — Ambulatory Visit
Admission: EM | Admit: 2022-03-29 | Discharge: 2022-03-29 | Disposition: A | Discharge status: Home / Self Care | Payer: Medicare PPO | Attending: Nurse Practitioner | Admitting: Nurse Practitioner

## 2022-03-29 DIAGNOSIS — R21 Rash and other nonspecific skin eruption: Secondary | ICD-10-CM | POA: Diagnosis not present

## 2022-03-29 MED ORDER — NYSTATIN 100000 UNIT/GM EX POWD: 1.0000 | Freq: Three times a day (TID) | CUTANEOUS | 0 refills | Status: DC

## 2022-03-29 MED ORDER — CLOTRIMAZOLE-BETAMETHASONE 1-0.05 % EX CREA: TOPICAL_CREAM | CUTANEOUS | 0 refills | Status: DC

## 2022-03-29 NOTE — Discharge Instructions (Signed)
Take medication as prescribed. May also take over-the-counter Zyrtec to help with itching. Avoid hot baths or showers while symptoms persist.  Recommend taking lukewarm baths. May apply cool cloths to the area to help with itching or discomfort. Avoid scratching, rubbing, or manipulating the areas while symptoms persist. Recommend Aveeno colloidal oatmeal bath to use to help with drying and itching. Follow up in this clinic or with PCP if symptoms do not improve.

## 2022-03-29 NOTE — ED Provider Notes (Signed)
[facility]    CSN: [id number] Arrival date & time: 03/29/22  1454      History   Chief Complaint Chief Complaint  Patient presents with   Rash    HPI Pamela Hatfield is a 71 y.o. female.   The history is provided by the patient.    Plaints of rash has been present for the past 3 days.  Patient states that she had a mammogram performed approximately 3 days prior to the onset of the rash.  Rash is located under both breast.  Rash is red, itchy, and "burning".  Patient states she has used alcohol powders without relief.  She denies the use of new soaps, detergents, medications, foods, or fever, chills, oozing or drainage.  Past Medical History:  Diagnosis Date   Accessory spleen    Arthritis    multiple areas of her body   Asthma    Cancer ([place])    located on her aorta and small intestines- on Octreodtide '10mg'$  IM every 28 days for this   Depression    Diabetes mellitus    diet controlled   Dyspnea    today 11/09/2017, reports that her breathing is normal for her    Dysrhythmia    hx rapid heart beat   Fibromyalgia    GERD (gastroesophageal reflux disease)    History of hiatal hernia    Hyperlipidemia    Hypertension    Myalgia and myositis, unspecified    Polyneuropathy    Sleep apnea    no cpap used, pt does not like, use to use CPAP but due to insurance had to return the device    Weight loss 02/25/2011    Patient Active Problem List   Diagnosis Date Noted   Constipation 12/30/2021   FH: colon cancer 12/30/2021   Carcinoid tumor of ileum 12/30/2021   Fracture, [place], right, closed 07/14/2021   DDD (degenerative disc disease), cervical 07/06/2021   Displacement of lumbar intervertebral disc without myelopathy 07/06/2021   Low back pain 07/06/2021   Malignant neoplastic disease ([place]) 07/06/2021   Osteoarthritis of knee 07/06/2021   Pain in thoracic spine 07/06/2021   Primary localized osteoarthritis of pelvic region and thigh 07/06/2021    Traumatic myositis ossificans 07/06/2021   Spasm 07/06/2021   Fibromyositis 07/06/2021   Compression fracture of T11 vertebra ([place]) 02/24/2021   Restless legs 02/23/2021   Submandibular gland swelling 11/13/2020   Chronic sinusitis 11/13/2020   OSA (obstructive sleep apnea) 09/04/2020   Acute sinusitis 09/04/2020   S/P carpal tunnel release left 02/12/20 08/06/2020   Hyponatremia 01/29/2020   Type 2 diabetes mellitus ([place]) 01/29/2020   Fibromyalgia    Seasonal allergies    Ataxic gait 12/11/2019   Bilateral carpal tunnel syndrome 12/11/2019   Dysesthesia 12/11/2019   Polyneuropathy 12/11/2019   Numbness of lower limb 06/15/2018   Paresthesia of upper limb 06/15/2018   Peripheral neuropathic pain 05/16/2018   Cervical vertebral fusion 11/16/2017   Carpal tunnel syndrome, left upper limb 10/18/2017   Dyspnea on exertion 08/12/2017   Essential hypertension 05/05/2016   Chest pain 05/05/2016   Palpitations 05/05/2016   Mesenteric mass 05/08/2013   Carcinoid tumor of abdomen 04/29/2013    Class: Acute   Malignant carcinoid tumor of other sites ([place]) 04/29/2013   Pancreatic mass 04/06/2013   Mixed hyperlipidemia 03/14/2013   Myalgia and myositis, unspecified 03/14/2013   Nonspecific (abnormal) findings on radiological and other examination of gastrointestinal tract 07/13/2012   Family history of  colon cancer 05/23/2012   Weight loss 05/23/2012   Abdominal pain 05/23/2012   GERD (gastroesophageal reflux disease) 05/23/2012   Dysphagia 05/23/2012   Family history of malignant neoplasm of gastrointestinal tract 05/23/2012    Past Surgical History:  Procedure Laterality Date   ABDOMINAL HYSTERECTOMY  yrs ago   ovaries remain, done because of endometriosis   ANTERIOR CERVICAL DECOMP/DISCECTOMY FUSION N/A 11/16/2017   Procedure: Anterior Cervical Decompression/Discectomy Fusion - Cervical four-Cervical five - Cervical five-Cervical six - Cervical six-Cervical seven;  Surgeon: Eustace Moore, MD;  Location: [facility];  Service: Neurosurgery;  Laterality: N/A;   BACK SURGERY  yrs ago   lower back   BREAST BIOPSY Bilateral    benign biopsies   BREAST LUMPECTOMY     both breast   CARDIAC CATHETERIZATION     CARPAL TUNNEL RELEASE Right 2003   CARPAL TUNNEL RELEASE Left 02/12/2020   Procedure: CARPAL TUNNEL RELEASE;  Surgeon: Carole Civil, MD;  Location: [facility];  Service: Orthopedics;  Laterality: Left;   CHOLECYSTECTOMY  yrs ago   COLONOSCOPY  06/2012   normal   ESOPHAGEAL DILATION  06/14/2012   Procedure: ESOPHAGEAL DILATION;  Surgeon: Daneil Dolin, MD;  Location: [facility];  Service: Endoscopy;;   EUS  07/13/2012   Procedure: UPPER ENDOSCOPIC ULTRASOUND (EUS) LINEAR;  Surgeon: Milus Banister, MD;  Location: [facility];  Service: Endoscopy;  Laterality: N/A;   NOSE SURGERY  yrs ago   ROTATOR CUFF REPAIR  yrs ago   left    OB History   No obstetric history on file.      Home Medications    Prior to Admission medications   Medication Sig Start Date End Date Taking? Authorizing Provider  clotrimazole-betamethasone (LOTRISONE) cream Apply to affected area 2 times daily until symptoms improve. 03/29/22  Yes Aleina Burgio-Warren, Alda Lea, NP  nystatin (MYCOSTATIN/NYSTOP) powder Apply 1 Application topically 3 (three) times daily. Apply 3 times daily until symptoms improve. 03/29/22  Yes Mikaella Escalona-Warren, Alda Lea, NP  albuterol (VENTOLIN HFA) 108 (90 Base) MCG/ACT inhaler Inhale 2 puffs into the lungs every 6 (six) hours as needed for wheezing or shortness of breath.  01/08/20   [provider]  aspirin EC 81 MG tablet Take 1 tablet (81 mg total) by mouth daily. Swallow whole. 01/13/22   Early Osmond, MD  atorvastatin (LIPITOR) 40 MG tablet Take 1 tablet (40 mg total) by mouth daily. 01/13/22   Early Osmond, MD  buprenorphine (SUBUTEX) 8 MG SUBL SL tablet Place 8 mg under the tongue 2 (two) times daily. 01/27/22   [provider]  cetirizine  (ZYRTEC) 10 MG tablet Take 10 mg by mouth daily as needed for allergies.     [provider]  Cholecalciferol (VITAMIN D3 PO) Take 1 tablet by mouth daily.    [provider]  cyclobenzaprine (FLEXERIL) 10 MG tablet Take 10 mg by mouth at bedtime as needed for muscle spasms.    [provider]  DULoxetine (CYMBALTA) 30 MG capsule Take 30 mg by mouth daily. 12/02/21   [provider]  enalapril (VASOTEC) 10 MG tablet Take 10 mg by mouth daily.     [provider]  fluticasone (FLONASE) 50 MCG/ACT nasal spray Place 1-2 sprays into both nostrils daily as needed for allergies or rhinitis.    [provider]  gabapentin (NEURONTIN) 600 MG tablet Take 600 mg by mouth 4 (four) times daily. 04/10/20   [provider]  guaifenesin (HUMIBID E) 400 MG TABS tablet Take 400 mg by mouth in the morning and at bedtime.    [provider]  ibuprofen (ADVIL) 800 MG tablet Take 1 tablet (800 mg total) by mouth every 8 (eight) hours as needed. Patient taking differently: Take 800 mg by mouth as needed. 02/12/20   Carole Civil, MD  levETIRAcetam (KEPPRA) 750 MG tablet Take 1 tablet (750 mg total) by mouth 2 (two) times daily. Patient taking differently: Take 750 mg by mouth daily. 02/09/22   Sater, Nanine Means, MD  linaclotide (LINZESS) 145 MCG CAPS capsule Take 145 mcg by mouth daily before breakfast.    [provider]  metoprolol (LOPRESSOR) 50 MG tablet Take 50 mg by mouth 2 (two) times daily.    [provider]  montelukast (SINGULAIR) 10 MG tablet Take 10 mg by mouth at bedtime.     [provider]  Multiple Vitamin (MULTIVITAMIN WITH MINERALS) TABS tablet Take 1 tablet by mouth daily.    [provider]  Multiple Vitamins-Minerals (HAIR/SKIN/NAILS) CAPS Take 2 capsules by mouth daily.    [provider]  naloxone [facility]) 2 MG/2ML injection Place 1 mg into the nose once. 11/12/20   [provider]  octreotide (SANDOSTATIN LAR) 10 MG injection Inject 10 mg into the muscle every 28 (twenty-eight) days.    [provider]  ondansetron (ZOFRAN) 4 MG tablet Take 4 mg by mouth every 8 (eight) hours as needed for nausea or vomiting.    [provider]  oxyCODONE-acetaminophen (PERCOCET/ROXICET) 5-325 MG tablet Take 1 tablet by mouth every 8 (eight) hours as needed for moderate pain.    [provider]  pantoprazole (PROTONIX) 20 MG tablet Take 20 mg by mouth daily.    [provider]  polyethylene glycol powder (GLYCOLAX/MIRALAX) 17 GM/SCOOP powder Take 17 g by mouth daily as needed for mild constipation or moderate constipation.    [provider]  trolamine salicylate (BLUE-EMU HEMP) 10 % cream Apply 1 Application topically as needed for muscle pain.    [provider]    Family History Family History  Problem Relation Age of Onset   Cancer Mother        died age 85, ?not sure what kind, metastatic at time of diagnosis   Colon cancer Father    Colon cancer Maternal Aunt    Colon cancer Maternal Uncle     Social History Social History   Tobacco Use   Smoking status: Former    Packs/day: 0.50    Years: 20.00    Total pack years: 10.00    Types: Cigarettes    Quit date: 09/13/1992    Years since quitting: 29.5   Smokeless tobacco: Never   Tobacco comments:    quit about 25 + years ago  Vaping Use   Vaping Use: Never used  Substance Use Topics   Alcohol use: No   Drug use: No     Allergies   Nickel, Sulfa antibiotics, Clarithromycin, Codeine, Lamotrigine, and Tape   Review of Systems Review of Systems Per HPI  Physical Exam Triage Vital Signs ED Triage Vitals  Enc Vitals Group     BP 03/29/22 1509 139/79     Pulse Rate 03/29/22 1509 61     Resp 03/29/22 1509 18     Temp 03/29/22 1509 98.5 F (36.9 C)     Temp src --      SpO2 03/29/22 1509 94 %  Weight --      Height --      Head  Circumference --      Peak Flow --      Pain Score 03/29/22 1508 3     Pain Loc --      Pain Edu? --      Excl. in [place]? --    No data found.  Updated Vital Signs BP 139/79   Pulse 61   Temp 98.5 F (36.9 C)   Resp 18   SpO2 94%   Visual Acuity Right Eye Distance:   Left Eye Distance:   Bilateral Distance:    Right Eye Near:   Left Eye Near:    Bilateral Near:     Physical Exam Vitals reviewed.  Constitutional:      General: She is not in acute distress.    Appearance: Normal appearance. She is well-developed.  HENT:     Head: Normocephalic.  Eyes:     Extraocular Movements: Extraocular movements intact.     Pupils: Pupils are equal, round, and reactive to light.  Cardiovascular:     Rate and Rhythm: Normal rate and regular rhythm.     Heart sounds: Normal heart sounds.  Pulmonary:     Effort: Pulmonary effort is normal.     Breath sounds: Normal breath sounds.  Abdominal:     General: Bowel sounds are normal. There is no distension.     Palpations: Abdomen is soft.     Tenderness: There is no abdominal tenderness. There is no guarding or rebound.  Genitourinary:    Vagina: Normal. No vaginal discharge.  Musculoskeletal:     Cervical back: Normal range of motion.  Skin:    General: Skin is warm and dry.     Findings: Erythema and rash present.     Comments: Erythematous patches in the skin folds of the bilateral breasts.  Areas with fissures and maceration present.  There is no swelling, oozing, or drainage noted.  Neurological:     General: No focal deficit present.     Mental Status: She is alert and oriented to person, place, and time.     Cranial Nerves: No cranial nerve deficit.  Psychiatric:        Behavior: Behavior normal.      UC Treatments / Results  Labs (all labs ordered are listed, but only abnormal results are displayed) Labs Reviewed - No data to display  EKG   Radiology No results found.  Procedures Procedures (including  critical care time)  Medications Ordered in UC Medications - No data to display  Initial Impression / Assessment and Plan / UC Course  I have reviewed the triage vital signs and the nursing notes.  Pertinent labs & imaging results that were available during my care of the patient were reviewed by me and considered in my medical decision making (see chart for details).  Patient presents with rash has been present for the past 3 days.  On exam, patient has moderate erythematous patches underneath the breast and the skin folds.  Fissures and maceration are present on exam.  There is no oozing, swelling, or drainage present.  Symptoms are consistent with intertrigo.  We will start patient on Lotrisone cream and nystatin powder.  Supportive care recommendations were provided to the patient.  Patient advised to follow-up in this clinic or with her primary care physician if symptoms do not improve. Final Clinical Impressions(s) / UC Diagnoses   Final diagnoses:  Rash  and nonspecific skin eruption     Discharge Instructions      Take medication as prescribed. May also take over-the-counter Zyrtec to help with itching. Avoid hot baths or showers while symptoms persist.  Recommend taking lukewarm baths. May apply cool cloths to the area to help with itching or discomfort. Avoid scratching, rubbing, or manipulating the areas while symptoms persist. Recommend Aveeno colloidal oatmeal bath to use to help with drying and itching. Follow up in this clinic or with PCP if symptoms do not improve.       ED Prescriptions     Medication Sig Dispense Auth. Provider   clotrimazole-betamethasone (LOTRISONE) cream Apply to affected area 2 times daily until symptoms improve. 45 g Kaizlee Carlino-Warren, Alda Lea, NP   nystatin (MYCOSTATIN/NYSTOP) powder Apply 1 Application topically 3 (three) times daily. Apply 3 times daily until symptoms improve. 60 g Xaria Judon-Warren, Alda Lea, NP      PDMP not reviewed this  encounter.   Tish Men, NP 03/29/22 1534

## 2022-03-29 NOTE — ED Triage Notes (Signed)
Pt presents with rash under both breast  that developed after mammogram

## 2022-03-30 ENCOUNTER — Encounter (HOSPITAL_COMMUNITY): Payer: Self-pay | Admitting: Internal Medicine

## 2022-03-30 NOTE — Pre-Procedure Instructions (Addendum)
Surgical Instructions    Your procedure is scheduled on Monday, July 24th.  Report to [facility] Main Entrance "A" at 05:30 A.M., then check in with the Admitting office.  Call this number if you have problems the morning of surgery:  [phone]   If you have any questions prior to your surgery date call [phone]: Open Monday-Friday 8am-4pm    Remember:  Do not eat or drink after midnight the night before your surgery      Take these medicines the morning of surgery with A SIP OF WATER  atorvastatin (LIPITOR)  DULoxetine (CYMBALTA)  gabapentin (NEURONTIN) guaifenesin (HUMIBID E)  levETIRAcetam (KEPPRA)  metoprolol (LOPRESSOR)  pantoprazole (PROTONIX)   If needed: albuterol (VENTOLIN HFA)- if needed, bring with you on day of surgery cetirizine (ZYRTEC)  fluticasone (FLONASE) ondansetron (ZOFRAN) oxyCODONE-acetaminophen (PERCOCET/ROXICET)     Follow your surgeon's instructions on when to stop Aspirin.  If no instructions were given by your surgeon then you will need to call the office to get those instructions.        HOW TO MANAGE YOUR DIABETES BEFORE AND AFTER SURGERY  Why is it important to control my blood sugar before and after surgery? Improving blood sugar levels before and after surgery helps healing and can limit problems. A way of improving blood sugar control is eating a healthy diet by:  Eating less sugar and carbohydrates  Increasing activity/exercise  Talking with your doctor about reaching your blood sugar goals High blood sugars (greater than 180 mg/dL) can raise your risk of infections and slow your recovery, so you will need to focus on controlling your diabetes during the weeks before surgery. Make sure that the doctor who takes care of your diabetes knows about your planned surgery including the date and location.  How do I manage my blood sugar before surgery? Check your blood sugar at least 4 times a day, starting 2 days before surgery, to  make sure that the level is not too high or low.  Check your blood sugar the morning of your surgery when you wake up and every 2 hours until you get to the Short Stay unit.  If your blood sugar is less than 70 mg/dL, you will need to treat for low blood sugar: Do not take insulin. Treat a low blood sugar (less than 70 mg/dL) with  cup of clear juice (cranberry or apple), 4 glucose tablets, OR glucose gel. Recheck blood sugar in 15 minutes after treatment (to make sure it is greater than 70 mg/dL). If your blood sugar is not greater than 70 mg/dL on recheck, call [phone] for further instructions. Report your blood sugar to the short stay nurse when you get to Short Stay.  If you are admitted to the hospital after surgery: Your blood sugar will be checked by the staff and you will probably be given insulin after surgery (instead of oral diabetes medicines) to make sure you have good blood sugar levels. The goal for blood sugar control after surgery is 80-180 mg/dL.   As of today, STOP taking any Aleve, Naproxen, Ibuprofen, Motrin, Advil, Goody's, BC's, all herbal medications, fish oil, and all vitamins.                     Do NOT Smoke (Tobacco/Vaping) for 24 hours prior to your procedure.  If you use a CPAP at night, you may bring your mask/headgear for your overnight stay.   Contacts, glasses, piercing's, hearing aid's, dentures or partials  may not be worn into surgery, please bring cases for these belongings.    For patients admitted to the hospital, discharge time will be determined by your treatment team.   Patients discharged the day of surgery will not be allowed to drive home, and someone needs to stay with them for 24 hours.  SURGICAL WAITING ROOM VISITATION Patients having surgery or a procedure may have no more than 2 support people in the waiting area - these visitors may rotate.   Children under the age of 62 must have an adult with them who is not the patient. If the  patient needs to stay at the hospital during part of their recovery, the visitor guidelines for inpatient rooms apply. Pre-op nurse will coordinate an appropriate time for 1 support person to accompany patient in pre-op.  This support person may not rotate.   Please refer to the [facility] website for the visitor guidelines for Inpatients (after your surgery is over and you are in a regular room).    Special instructions:    [place]- Preparing For Surgery  Before surgery, you can play an important role. Because skin is not sterile, your skin needs to be as free of germs as possible. You can reduce the number of germs on your skin by washing with CHG (chlorahexidine gluconate) Soap before surgery.  CHG is an antiseptic cleaner which kills germs and bonds with the skin to continue killing germs even after washing.    Oral Hygiene is also important to reduce your risk of infection.  Remember - BRUSH YOUR TEETH THE MORNING OF SURGERY WITH YOUR REGULAR TOOTHPASTE  Please do not use if you have an allergy to CHG or antibacterial soaps. If your skin becomes reddened/irritated stop using the CHG.  Do not shave (including legs and underarms) for at least 48 hours prior to first CHG shower. It is OK to shave your face.  Please follow these instructions carefully.   Shower the NIGHT BEFORE SURGERY and the MORNING OF SURGERY  If you chose to wash your hair, wash your hair first as usual with your normal shampoo.  After you shampoo, rinse your hair and body thoroughly to remove the shampoo.  Use CHG Soap as you would any other liquid soap. You can apply CHG directly to the skin and wash gently with a scrungie or a clean washcloth.   Apply the CHG Soap to your body ONLY FROM THE NECK DOWN.  Do not use on open wounds or open sores. Avoid contact with your eyes, ears, mouth and genitals (private parts). Wash Face and genitals (private parts)  with your normal soap.   Wash thoroughly, paying special  attention to the area where your surgery will be performed.  Thoroughly rinse your body with warm water from the neck down.  DO NOT shower/wash with your normal soap after using and rinsing off the CHG Soap.  Pat yourself dry with a CLEAN TOWEL.  Wear CLEAN PAJAMAS to bed the night before surgery  Place CLEAN SHEETS on your bed the night before your surgery  DO NOT SLEEP WITH PETS.   Day of Surgery: Take a shower with CHG soap. Do not wear jewelry or makeup Do not wear lotions, powders, perfumes, or deodorant. Do not shave 48 hours prior to surgery.   Do not bring valuables to the hospital. [facility] is not responsible for any belongings or valuables. Do not wear nail polish, gel polish, artificial nails, or any other type of covering  on natural nails (fingers and toes) If you have artificial nails or gel coating that need to be removed by a nail salon, please have this removed prior to surgery. Artificial nails or gel coating may interfere with anesthesia's ability to adequately monitor your vital signs. Wear Clean/Comfortable clothing the morning of surgery Remember to brush your teeth WITH YOUR REGULAR TOOTHPASTE.   Please read over the following fact sheets that you were given.    If you received a COVID test during your pre-op visit  it is requested that you wear a mask when out in public, stay away from anyone that may not be feeling well and notify your surgeon if you develop symptoms. If you have been in contact with anyone that has tested positive in the last 10 days please notify you surgeon.

## 2022-03-31 ENCOUNTER — Encounter (HOSPITAL_COMMUNITY): Payer: Self-pay

## 2022-03-31 ENCOUNTER — Encounter (HOSPITAL_COMMUNITY)
Admission: RE | Admit: 2022-03-31 | Discharge: 2022-03-31 | Disposition: A | Discharge status: Home / Self Care | Payer: Medicare PPO | Source: Ambulatory Visit | Attending: Neurological Surgery | Admitting: Neurological Surgery

## 2022-03-31 ENCOUNTER — Other Ambulatory Visit: Payer: Self-pay

## 2022-03-31 VITALS — BP 157/75 | HR 57 | Temp 97.8°F | Resp 17 | Ht 65.0 in | Wt 179.4 lb

## 2022-03-31 DIAGNOSIS — Z01818 Encounter for other preprocedural examination: Secondary | ICD-10-CM

## 2022-03-31 DIAGNOSIS — M5001 Cervical disc disorder with myelopathy,  high cervical region: Secondary | ICD-10-CM | POA: Diagnosis not present

## 2022-03-31 DIAGNOSIS — Z01812 Encounter for preprocedural laboratory examination: Secondary | ICD-10-CM | POA: Insufficient documentation

## 2022-03-31 DIAGNOSIS — E119 Type 2 diabetes mellitus without complications: Secondary | ICD-10-CM | POA: Insufficient documentation

## 2022-03-31 DIAGNOSIS — I10 Essential (primary) hypertension: Secondary | ICD-10-CM | POA: Diagnosis not present

## 2022-03-31 LAB — GLUCOSE, CAPILLARY: Glucose-Capillary: 123 mg/dL — ABNORMAL HIGH (ref 70–99)

## 2022-03-31 LAB — BASIC METABOLIC PANEL
Anion gap: 7 (ref 5–15)
BUN: 7 mg/dL — ABNORMAL LOW (ref 8–23)
CO2: 30 mmol/L (ref 22–32)
Calcium: 9 mg/dL (ref 8.9–10.3)
Chloride: 102 mmol/L (ref 98–111)
Creatinine, Ser: 1 mg/dL (ref 0.44–1.00)
GFR, Estimated: 60 mL/min — ABNORMAL LOW (ref >60)
Glucose, Bld: 113 mg/dL — ABNORMAL HIGH (ref 70–99)
Potassium: 4.3 mmol/L (ref 3.5–5.1)
Sodium: 139 mmol/L (ref 135–145)

## 2022-03-31 LAB — PROTIME-INR
INR: 1.1 (ref 0.8–1.2)
Prothrombin Time: 14.1 seconds (ref 11.4–15.2)

## 2022-03-31 LAB — CBC
HCT: 36.8 % (ref 36.0–46.0)
Hemoglobin: 11.5 g/dL — ABNORMAL LOW (ref 12.0–15.0)
MCH: 28 pg (ref 26.0–34.0)
MCHC: 31.3 g/dL (ref 30.0–36.0)
MCV: 89.5 fL (ref 80.0–100.0)
Platelets: 342 10*3/uL (ref 150–400)
RBC: 4.11 MIL/uL (ref 3.87–5.11)
RDW: 14.3 % (ref 11.5–15.5)
WBC: 11.3 10*3/uL — ABNORMAL HIGH (ref 4.0–10.5)
nRBC: 0 % (ref 0.0–0.2)

## 2022-03-31 LAB — HEMOGLOBIN A1C
Hgb A1c MFr Bld: 6.5 % — ABNORMAL HIGH (ref 4.8–5.6)
Mean Plasma Glucose: 139.85 mg/dL

## 2022-03-31 LAB — SURGICAL PCR SCREEN
MRSA, PCR: NEGATIVE
Staphylococcus aureus: POSITIVE — AB

## 2022-03-31 NOTE — Progress Notes (Signed)
PCP - Dr. Edwinna Areola. [facility] Cardiologist - Dr. Lenna Sciara  PPM/ICD - denies   Chest x-ray - 11/09/17 EKG - 01/13/22 Stress Test - 10 + years ago per pt, no abnormalities ECHO - 02/01/22 Cardiac Cath - 2010? Pt unsure  Sleep Study - 2020, OSA+ CPAP - denies, unable to tolerate  DM- Type 2 Pt "rarely" checks CBG at home  Blood Thinner Instructions: n/a Aspirin Instructions: Last dose 7/5. Pt has held prior to surgery  ERAS Protcol - no, NPO   COVID TEST- n/a   Anesthesia review: yes, cardiac hx  Patient denies shortness of breath, fever, cough and chest pain at PAT appointment   All instructions explained to the patient, with a verbal understanding of the material. Patient agrees to go over the instructions while at home for a better understanding. The opportunity to ask questions was provided.

## 2022-04-01 ENCOUNTER — Encounter (HOSPITAL_COMMUNITY): Payer: Self-pay | Admitting: Emergency Medicine

## 2022-04-01 NOTE — Progress Notes (Signed)
Anesthesia Chart Review:   Case: [id number] Date/Time: 04/05/22 0715   Procedure: ACDF - C3-C4, removal plate C4-5 - 3C   Anesthesia type: General   Pre-op diagnosis: Myelopathy   Location: [facility] OR ROOM 79 / [place] [facility]   Surgeons: Eustace Moore, MD       DISCUSSION: Pt is 71 years old with hx HTN, OSA, asthma, DM, carcinoid tumor of large intestine   PROVIDERS: - PCP is Celene Squibb, MD  - Saw cardiologist Lenna Sciara, MD on 01/13/22 for eval chest pain and SOB. CTA did not show obstructive CAD, echo reassuring. Medical management with stati and ASA recommended  - Oncologist is Karle Starch, MD (notes in care everywhere)   LABS: Labs reviewed: Acceptable for surgery. - CBC 03/31/22: WBC 11.3, hgb 11.5 - BMP 03/31/22: glucose 113 - INR 03/31/22: normal  IMAGES: CT chest abd pelvis 02/15/22 (care everywhere): 1. Prominent mesenteric lymph nodes, as seen on prior examinations, with slowly increasing mesenteric edema.  2. Trace posterior pericardial effusion, slightly increasing over past year.  3. Indeterminate left renal cortical hypodensity, stable.   EKG 01/13/22: sinus rhythm with nonspecific ST and T wave changes.   CV: Echo 02/01/22:  1. Left ventricular ejection fraction, by estimation, is 60 to 65%. The left ventricle has normal function. The left ventricle has no regional wall motion abnormalities. Left ventricular diastolic parameters are consistent with Grade I diastolic dysfunction (impaired relaxation).  2. Right ventricular systolic function is normal. The right ventricular size is normal. Tricuspid regurgitation signal is inadequate for assessing PA pressure.  3. The mitral valve is normal in structure. No evidence of mitral valve regurgitation. No evidence of mitral stenosis.  4. The aortic valve was not well visualized. Aortic valve regurgitation is trivial. No aortic stenosis is present. 5. The inferior vena cava is normal in size with greater than 50% respiratory  variability, suggesting right atrial pressure of 3 mmHg.   Coronary CT 01/28/22:  1. Coronary calcium score of 10 this was 41st percentile for age, sex, and race matched control. 2. Normal coronary origin with left dominance. 3. Dilation of the main pulmonary artery, moderate, 32 mm. This can be associated with the presence of pulmonary hypertension; clinical correlation advised. 4. Aortic atherosclerosis. 5. CAD-RADS 1. Minimal non-obstructive CAD (1-24%). Consider non-atherosclerotic causes of chest pain. Consider preventive therapy and risk factor modification.   Past Medical History:  Diagnosis Date   Accessory spleen    Arthritis    multiple areas of her body   Asthma    Cancer ([place])    located on her aorta and small intestines- on Octreodtide '10mg'$  IM every 28 days for this   Depression    Diabetes mellitus    diet controlled   Dyspnea    today 11/09/2017, reports that her breathing is normal for her    Dysrhythmia    hx rapid heart beat   Fibromyalgia    GERD (gastroesophageal reflux disease)    History of hiatal hernia    Hyperlipidemia    Hypertension    Myalgia and myositis, unspecified    Polyneuropathy    Sleep apnea    no cpap used, pt does not like, use to use CPAP but due to insurance had to return the device    Weight loss 02/25/2011    Past Surgical History:  Procedure Laterality Date   ABDOMINAL HYSTERECTOMY  yrs ago   ovaries remain, done because of endometriosis   ANTERIOR CERVICAL DECOMP/DISCECTOMY FUSION  N/A 11/16/2017   Procedure: Anterior Cervical Decompression/Discectomy Fusion - Cervical four-Cervical five - Cervical five-Cervical six - Cervical six-Cervical seven;  Surgeon: Eustace Moore, MD;  Location: [place];  Service: Neurosurgery;  Laterality: N/A;   BACK SURGERY  yrs ago   lower back   BREAST BIOPSY Bilateral    benign biopsies   BREAST LUMPECTOMY     both breast   CARDIAC CATHETERIZATION     CARPAL TUNNEL RELEASE Right 2003   CARPAL  TUNNEL RELEASE Left 02/12/2020   Procedure: CARPAL TUNNEL RELEASE;  Surgeon: Carole Civil, MD;  Location: [facility];  Service: Orthopedics;  Laterality: Left;   CHOLECYSTECTOMY  yrs ago   COLONOSCOPY  06/2012   normal   ESOPHAGEAL DILATION  06/14/2012   Procedure: ESOPHAGEAL DILATION;  Surgeon: Daneil Dolin, MD;  Location: [facility];  Service: Endoscopy;;   EUS  07/13/2012   Procedure: UPPER ENDOSCOPIC ULTRASOUND (EUS) LINEAR;  Surgeon: Milus Banister, MD;  Location: [facility];  Service: Endoscopy;  Laterality: N/A;   FLEXIBLE SIGMOIDOSCOPY N/A 03/24/2022   Procedure: FLEXIBLE SIGMOIDOSCOPY;  Surgeon: Daneil Dolin, MD;  Location: [facility];  Service: Endoscopy;  Laterality: N/A;   NOSE SURGERY  yrs ago   ROTATOR CUFF REPAIR  yrs ago   left    MEDICATIONS: No current facility-administered medications for this encounter.    albuterol (VENTOLIN HFA) 108 (90 Base) MCG/ACT inhaler   aspirin EC 81 MG tablet   atorvastatin (LIPITOR) 40 MG tablet   buprenorphine (SUBUTEX) 8 MG SUBL SL tablet   cetirizine (ZYRTEC) 10 MG tablet   Cholecalciferol (VITAMIN D3 PO)   clotrimazole-betamethasone (LOTRISONE) cream   cyclobenzaprine (FLEXERIL) 10 MG tablet   DULoxetine (CYMBALTA) 30 MG capsule   enalapril (VASOTEC) 10 MG tablet   fluticasone (FLONASE) 50 MCG/ACT nasal spray   gabapentin (NEURONTIN) 600 MG tablet   guaifenesin (HUMIBID E) 400 MG TABS tablet   ibuprofen (ADVIL) 800 MG tablet   levETIRAcetam (KEPPRA) 750 MG tablet   linaclotide (LINZESS) 145 MCG CAPS capsule   metoprolol (LOPRESSOR) 50 MG tablet   montelukast (SINGULAIR) 10 MG tablet   Multiple Vitamin (MULTIVITAMIN WITH MINERALS) TABS tablet   Multiple Vitamins-Minerals (HAIR/SKIN/NAILS) CAPS   naloxone (NARCAN) 2 MG/2ML injection   nystatin (MYCOSTATIN/NYSTOP) powder   octreotide (SANDOSTATIN LAR) 10 MG injection   ondansetron (ZOFRAN) 4 MG tablet   oxyCODONE-acetaminophen (PERCOCET/ROXICET) 5-325 MG  tablet   pantoprazole (PROTONIX) 20 MG tablet   polyethylene glycol powder (GLYCOLAX/MIRALAX) 17 GM/SCOOP powder   trolamine salicylate (BLUE-EMU HEMP) 10 % cream    If no changes, I anticipate pt can proceed with surgery as scheduled.   Willeen Cass, PhD, FNP-BC [facility] Short Stay Surgical [facility]/Anesthesiology Phone: [phone] 04/01/2022 1:42 PM

## 2022-04-01 NOTE — Anesthesia Preprocedure Evaluation (Addendum)
Anesthesia Evaluation  Patient identified by MRN, date of birth, ID band Patient awake    Reviewed: Allergy & Precautions, NPO status , Patient's Chart, lab work & pertinent test results, reviewed documented beta blocker date and time   Airway Mallampati: III  TM Distance: >3 FB Neck ROM: Full  Mouth opening: Limited Mouth Opening  Dental no notable dental hx. (+) Teeth Intact, Dental Advisory Given   Pulmonary asthma , sleep apnea (no CPAP) , former smoker,    Pulmonary exam normal breath sounds clear to auscultation       Cardiovascular hypertension, Pt. on medications and Pt. on home beta blockers Normal cardiovascular exam Rhythm:Regular Rate:Normal  CV: Echo 02/01/22:  1. Left ventricular ejection fraction, by estimation, is 60 to 65%. The left ventricle has normal function. The left ventricle has no regional wall motion abnormalities. Left ventricular diastolic parameters are consistent with Grade I diastolic dysfunction (impaired relaxation).  2. Right ventricular systolic function is normal. The right ventricular size is normal. Tricuspid regurgitation signal is inadequate for assessing PA pressure.  3. The mitral valve is normal in structure. No evidence of mitral valve regurgitation. No evidence of mitral stenosis.  4. The aortic valve was not well visualized. Aortic valve regurgitation is trivial. No aortic stenosis is present. 5. The inferior vena cava is normal in size with greater than 50% respiratory variability, suggesting right atrial pressure of 3 mmHg.   Coronary CT 01/28/22:  1. Coronary calcium score of 10 this was 41st percentile for age, sex, and race matched control. 2. Normal coronary origin with left dominance. 3. Dilation of the main pulmonary artery, moderate, 32 mm. This can be associated with the presence of pulmonary hypertension; clinical correlation advised. 4. Aortic atherosclerosis. 5. CAD-RADS 1.  Minimal non-obstructive CAD (1-24%). Consider non-atherosclerotic causes of chest pain. Consider preventive therapy and risk factor modification.   Neuro/Psych Depression negative neurological ROS  negative psych ROS   GI/Hepatic Neg liver ROS, hiatal hernia, GERD  Medicated,  Endo/Other  diabetes, Well Controlled, Type 2  Renal/GU negative Renal ROS  negative genitourinary   Musculoskeletal  (+) Arthritis , Fibromyalgia -, narcotic dependent  Abdominal   Peds  Hematology negative hematology ROS (+)   Anesthesia Other Findings On subutex, last dose 2 days ago On keppra for "body jerks," denies h/o seizures  Reproductive/Obstetrics                           Anesthesia Physical Anesthesia Plan  ASA: 3  Anesthesia Plan: General   Post-op Pain Management: Tylenol PO (pre-op)* and Ketamine IV*   Induction: Intravenous  PONV Risk Score and Plan: 3 and Midazolam, Dexamethasone and Ondansetron  Airway Management Planned: Oral ETT and Video Laryngoscope Planned  Additional Equipment:   Intra-op Plan:   Post-operative Plan: Extubation in OR  Informed Consent: I have reviewed the patients History and Physical, chart, labs and discussed the procedure including the risks, benefits and alternatives for the proposed anesthesia with the patient or authorized representative who has indicated his/her understanding and acceptance.     Dental advisory given  Plan Discussed with: CRNA  Anesthesia Plan Comments:       Anesthesia Quick Evaluation

## 2022-04-05 ENCOUNTER — Other Ambulatory Visit: Payer: Self-pay

## 2022-04-05 ENCOUNTER — Inpatient Hospital Stay (HOSPITAL_COMMUNITY)
Admission: AD | Disposition: A | Discharge status: Home / Self Care | Payer: Self-pay | Source: Home / Self Care | Attending: Neurological Surgery

## 2022-04-05 ENCOUNTER — Ambulatory Visit (HOSPITAL_COMMUNITY): Payer: Medicare PPO

## 2022-04-05 ENCOUNTER — Encounter (HOSPITAL_COMMUNITY): Payer: Self-pay | Admitting: Neurological Surgery

## 2022-04-05 ENCOUNTER — Ambulatory Visit (HOSPITAL_COMMUNITY): Payer: Medicare PPO | Admitting: Physician Assistant

## 2022-04-05 ENCOUNTER — Inpatient Hospital Stay (HOSPITAL_COMMUNITY)
Admission: AD | Admit: 2022-04-05 | Discharge: 2022-04-06 | DRG: 472 | Disposition: A | Discharge status: Home / Self Care | Payer: Medicare PPO | Attending: Neurological Surgery | Admitting: Neurological Surgery

## 2022-04-05 ENCOUNTER — Ambulatory Visit (HOSPITAL_BASED_OUTPATIENT_CLINIC_OR_DEPARTMENT_OTHER): Payer: Medicare PPO | Admitting: Physician Assistant

## 2022-04-05 DIAGNOSIS — J45909 Unspecified asthma, uncomplicated: Secondary | ICD-10-CM | POA: Diagnosis not present

## 2022-04-05 DIAGNOSIS — M5001 Cervical disc disorder with myelopathy,  high cervical region: Secondary | ICD-10-CM | POA: Diagnosis not present

## 2022-04-05 DIAGNOSIS — E1142 Type 2 diabetes mellitus with diabetic polyneuropathy: Secondary | ICD-10-CM | POA: Diagnosis present

## 2022-04-05 DIAGNOSIS — M159 Polyosteoarthritis, unspecified: Secondary | ICD-10-CM | POA: Diagnosis present

## 2022-04-05 DIAGNOSIS — Z79891 Long term (current) use of opiate analgesic: Secondary | ICD-10-CM | POA: Diagnosis not present

## 2022-04-05 DIAGNOSIS — Z9109 Other allergy status, other than to drugs and biological substances: Secondary | ICD-10-CM

## 2022-04-05 DIAGNOSIS — Z885 Allergy status to narcotic agent status: Secondary | ICD-10-CM | POA: Diagnosis not present

## 2022-04-05 DIAGNOSIS — Z7982 Long term (current) use of aspirin: Secondary | ICD-10-CM | POA: Diagnosis not present

## 2022-04-05 DIAGNOSIS — I10 Essential (primary) hypertension: Secondary | ICD-10-CM | POA: Diagnosis not present

## 2022-04-05 DIAGNOSIS — M4322 Fusion of spine, cervical region: Secondary | ICD-10-CM | POA: Diagnosis not present

## 2022-04-05 DIAGNOSIS — Z9049 Acquired absence of other specified parts of digestive tract: Secondary | ICD-10-CM | POA: Diagnosis not present

## 2022-04-05 DIAGNOSIS — F32A Depression, unspecified: Secondary | ICD-10-CM | POA: Diagnosis present

## 2022-04-05 DIAGNOSIS — Q8909 Congenital malformations of spleen: Secondary | ICD-10-CM

## 2022-04-05 DIAGNOSIS — Z90710 Acquired absence of both cervix and uterus: Secondary | ICD-10-CM

## 2022-04-05 DIAGNOSIS — M4712 Other spondylosis with myelopathy, cervical region: Secondary | ICD-10-CM | POA: Diagnosis not present

## 2022-04-05 DIAGNOSIS — Z888 Allergy status to other drugs, medicaments and biological substances status: Secondary | ICD-10-CM

## 2022-04-05 DIAGNOSIS — E119 Type 2 diabetes mellitus without complications: Secondary | ICD-10-CM | POA: Diagnosis not present

## 2022-04-05 DIAGNOSIS — E785 Hyperlipidemia, unspecified: Secondary | ICD-10-CM | POA: Diagnosis present

## 2022-04-05 DIAGNOSIS — Z79890 Hormone replacement therapy (postmenopausal): Secondary | ICD-10-CM

## 2022-04-05 DIAGNOSIS — Z881 Allergy status to other antibiotic agents status: Secondary | ICD-10-CM | POA: Diagnosis not present

## 2022-04-05 DIAGNOSIS — M4802 Spinal stenosis, cervical region: Secondary | ICD-10-CM | POA: Diagnosis not present

## 2022-04-05 DIAGNOSIS — Z882 Allergy status to sulfonamides status: Secondary | ICD-10-CM | POA: Diagnosis not present

## 2022-04-05 DIAGNOSIS — Z87891 Personal history of nicotine dependence: Secondary | ICD-10-CM

## 2022-04-05 DIAGNOSIS — Z79899 Other long term (current) drug therapy: Secondary | ICD-10-CM

## 2022-04-05 DIAGNOSIS — Z981 Arthrodesis status: Secondary | ICD-10-CM | POA: Diagnosis not present

## 2022-04-05 DIAGNOSIS — G4733 Obstructive sleep apnea (adult) (pediatric): Secondary | ICD-10-CM | POA: Diagnosis present

## 2022-04-05 DIAGNOSIS — M797 Fibromyalgia: Secondary | ICD-10-CM | POA: Diagnosis present

## 2022-04-05 DIAGNOSIS — K219 Gastro-esophageal reflux disease without esophagitis: Secondary | ICD-10-CM | POA: Diagnosis present

## 2022-04-05 DIAGNOSIS — Z86018 Personal history of other benign neoplasm: Secondary | ICD-10-CM

## 2022-04-05 HISTORY — PX: ANTERIOR CERVICAL DECOMP/DISCECTOMY FUSION: SHX1161

## 2022-04-05 LAB — GLUCOSE, CAPILLARY
Glucose-Capillary: 104 mg/dL — ABNORMAL HIGH (ref 70–99)
Glucose-Capillary: 164 mg/dL — ABNORMAL HIGH (ref 70–99)
Glucose-Capillary: 211 mg/dL — ABNORMAL HIGH (ref 70–99)
Glucose-Capillary: 243 mg/dL — ABNORMAL HIGH (ref 70–99)

## 2022-04-05 SURGERY — ANTERIOR CERVICAL DECOMPRESSION/DISCECTOMY FUSION 1 LEVEL
Anesthesia: General

## 2022-04-05 MED ORDER — MONTELUKAST SODIUM 10 MG PO TABS
10.0000 mg | ORAL_TABLET | Freq: Every day | ORAL | Status: DC
  Administered 2022-04-05: 10 mg via ORAL
  Filled 2022-04-05: qty 1

## 2022-04-05 MED ORDER — ROCURONIUM BROMIDE 10 MG/ML (PF) SYRINGE
PREFILLED_SYRINGE | INTRAVENOUS | Status: DC | PRN
  Administered 2022-04-05: 100 mg via INTRAVENOUS

## 2022-04-05 MED ORDER — ACETAMINOPHEN 500 MG PO TABS
1000.0000 mg | ORAL_TABLET | Freq: Once | ORAL | Status: AC
  Administered 2022-04-05: 1000 mg via ORAL
  Filled 2022-04-05: qty 2

## 2022-04-05 MED ORDER — OXYCODONE HCL 5 MG PO TABS
5.0000 mg | ORAL_TABLET | ORAL | Status: DC | PRN
  Administered 2022-04-05: 5 mg via ORAL
  Filled 2022-04-05: qty 1

## 2022-04-05 MED ORDER — LEVETIRACETAM 250 MG PO TABS
750.0000 mg | ORAL_TABLET | Freq: Every day | ORAL | Status: DC
  Administered 2022-04-05 – 2022-04-06 (×2): 750 mg via ORAL
  Filled 2022-04-05 (×2): qty 3

## 2022-04-05 MED ORDER — DEXAMETHASONE SODIUM PHOSPHATE 4 MG/ML IJ SOLN
4.0000 mg | Freq: Four times a day (QID) | INTRAMUSCULAR | Status: DC
  Administered 2022-04-05 – 2022-04-06 (×2): 4 mg via INTRAVENOUS
  Filled 2022-04-05 (×2): qty 1

## 2022-04-05 MED ORDER — CHLORHEXIDINE GLUCONATE CLOTH 2 % EX PADS: 6.0000 | MEDICATED_PAD | Freq: Once | CUTANEOUS | Status: DC

## 2022-04-05 MED ORDER — DULOXETINE HCL 30 MG PO CPEP
30.0000 mg | ORAL_CAPSULE | Freq: Every day | ORAL | Status: DC
  Administered 2022-04-06: 30 mg via ORAL
  Filled 2022-04-05: qty 1

## 2022-04-05 MED ORDER — FENTANYL CITRATE (PF) 100 MCG/2ML IJ SOLN
25.0000 ug | INTRAMUSCULAR | Status: DC | PRN
  Administered 2022-04-05 (×3): 50 ug via INTRAVENOUS

## 2022-04-05 MED ORDER — ROCURONIUM BROMIDE 10 MG/ML (PF) SYRINGE
PREFILLED_SYRINGE | INTRAVENOUS | Status: AC
  Filled 2022-04-05: qty 10

## 2022-04-05 MED ORDER — INSULIN ASPART 100 UNIT/ML IJ SOLN: 0.0000 [IU] | INTRAMUSCULAR | Status: DC | PRN

## 2022-04-05 MED ORDER — PROPOFOL 1000 MG/100ML IV EMUL
INTRAVENOUS | Status: AC
  Filled 2022-04-05: qty 100

## 2022-04-05 MED ORDER — ALBUTEROL SULFATE HFA 108 (90 BASE) MCG/ACT IN AERS
2.0000 | INHALATION_SPRAY | Freq: Four times a day (QID) | RESPIRATORY_TRACT | Status: DC | PRN
Start: 2022-04-05 — End: 2022-04-05

## 2022-04-05 MED ORDER — FENTANYL CITRATE (PF) 250 MCG/5ML IJ SOLN
INTRAMUSCULAR | Status: AC
  Filled 2022-04-05: qty 5

## 2022-04-05 MED ORDER — SODIUM CHLORIDE 0.9 % IV SOLN: 250.0000 mL | INTRAVENOUS | Status: DC

## 2022-04-05 MED ORDER — ONDANSETRON HCL 4 MG PO TABS: 4.0000 mg | ORAL_TABLET | Freq: Four times a day (QID) | ORAL | Status: DC | PRN

## 2022-04-05 MED ORDER — LACTATED RINGERS IV SOLN: INTRAVENOUS | Status: DC

## 2022-04-05 MED ORDER — PHENYLEPHRINE 80 MCG/ML (10ML) SYRINGE FOR IV PUSH (FOR BLOOD PRESSURE SUPPORT)
PREFILLED_SYRINGE | INTRAVENOUS | Status: AC
Start: 2022-04-05 — End: ?
  Filled 2022-04-05: qty 10

## 2022-04-05 MED ORDER — POTASSIUM CHLORIDE IN NACL 20-0.9 MEQ/L-% IV SOLN
INTRAVENOUS | Status: DC
Start: 2022-04-05 — End: 2022-04-06

## 2022-04-05 MED ORDER — BUPRENORPHINE HCL 8 MG SL SUBL: 8.0000 mg | SUBLINGUAL_TABLET | Freq: Two times a day (BID) | SUBLINGUAL | Status: DC | PRN

## 2022-04-05 MED ORDER — SUGAMMADEX SODIUM 200 MG/2ML IV SOLN
INTRAVENOUS | Status: DC | PRN
  Administered 2022-04-05: 200 mg via INTRAVENOUS

## 2022-04-05 MED ORDER — DEXAMETHASONE SODIUM PHOSPHATE 10 MG/ML IJ SOLN
INTRAMUSCULAR | Status: DC | PRN
  Administered 2022-04-05: 10 mg via INTRAVENOUS

## 2022-04-05 MED ORDER — PHENYLEPHRINE HCL-NACL 20-0.9 MG/250ML-% IV SOLN
INTRAVENOUS | Status: DC | PRN
  Administered 2022-04-05: 20 ug/min via INTRAVENOUS

## 2022-04-05 MED ORDER — BUPIVACAINE HCL (PF) 0.25 % IJ SOLN
INTRAMUSCULAR | Status: DC | PRN
  Administered 2022-04-05: 3 mL

## 2022-04-05 MED ORDER — CYCLOBENZAPRINE HCL 10 MG PO TABS
10.0000 mg | ORAL_TABLET | Freq: Every evening | ORAL | Status: DC | PRN
  Administered 2022-04-05 – 2022-04-06 (×2): 10 mg via ORAL
  Filled 2022-04-05 (×2): qty 1

## 2022-04-05 MED ORDER — GABAPENTIN 300 MG PO CAPS
300.0000 mg | ORAL_CAPSULE | ORAL | Status: DC
  Filled 2022-04-05: qty 1

## 2022-04-05 MED ORDER — DEXAMETHASONE 4 MG PO TABS
4.0000 mg | ORAL_TABLET | Freq: Four times a day (QID) | ORAL | Status: DC
  Administered 2022-04-05 (×2): 4 mg via ORAL
  Filled 2022-04-05 (×2): qty 1

## 2022-04-05 MED ORDER — GABAPENTIN 600 MG PO TABS
600.0000 mg | ORAL_TABLET | Freq: Four times a day (QID) | ORAL | Status: DC
  Administered 2022-04-05 – 2022-04-06 (×4): 600 mg via ORAL
  Filled 2022-04-05 (×4): qty 1

## 2022-04-05 MED ORDER — CEFAZOLIN SODIUM-DEXTROSE 2-4 GM/100ML-% IV SOLN
2.0000 g | Freq: Three times a day (TID) | INTRAVENOUS | Status: AC
  Administered 2022-04-05 (×2): 2 g via INTRAVENOUS
  Filled 2022-04-05 (×2): qty 100

## 2022-04-05 MED ORDER — MIDAZOLAM HCL 2 MG/2ML IJ SOLN
INTRAMUSCULAR | Status: AC
  Filled 2022-04-05: qty 2

## 2022-04-05 MED ORDER — HYDROMORPHONE HCL 1 MG/ML IJ SOLN
INTRAMUSCULAR | Status: AC
  Filled 2022-04-05: qty 1

## 2022-04-05 MED ORDER — ENALAPRIL MALEATE 10 MG PO TABS
10.0000 mg | ORAL_TABLET | Freq: Every day | ORAL | Status: DC
  Administered 2022-04-06: 10 mg via ORAL
  Filled 2022-04-05: qty 2

## 2022-04-05 MED ORDER — THROMBIN (RECOMBINANT) 5000 UNITS EX SOLR
CUTANEOUS | Status: DC | PRN
  Administered 2022-04-05: 10 mL via TOPICAL

## 2022-04-05 MED ORDER — SENNA 8.6 MG PO TABS
1.0000 | ORAL_TABLET | Freq: Two times a day (BID) | ORAL | Status: DC
  Administered 2022-04-05 – 2022-04-06 (×3): 8.6 mg via ORAL
  Filled 2022-04-05 (×3): qty 1

## 2022-04-05 MED ORDER — PHENOL 1.4 % MT LIQD: 1.0000 | OROMUCOSAL | Status: DC | PRN

## 2022-04-05 MED ORDER — ORAL CARE MOUTH RINSE: 15.0000 mL | Freq: Once | OROMUCOSAL | Status: AC

## 2022-04-05 MED ORDER — ACETAMINOPHEN 500 MG PO TABS
1000.0000 mg | ORAL_TABLET | Freq: Four times a day (QID) | ORAL | Status: AC
  Administered 2022-04-05 – 2022-04-06 (×4): 1000 mg via ORAL
  Filled 2022-04-05 (×4): qty 2

## 2022-04-05 MED ORDER — ARTIFICIAL TEARS OPHTHALMIC OINT
TOPICAL_OINTMENT | OPHTHALMIC | Status: AC
  Filled 2022-04-05: qty 3.5

## 2022-04-05 MED ORDER — CEFAZOLIN SODIUM-DEXTROSE 2-4 GM/100ML-% IV SOLN
2.0000 g | INTRAVENOUS | Status: AC
  Administered 2022-04-05: 2 g via INTRAVENOUS
  Filled 2022-04-05: qty 100

## 2022-04-05 MED ORDER — LINACLOTIDE 145 MCG PO CAPS
145.0000 ug | ORAL_CAPSULE | Freq: Every day | ORAL | Status: DC
  Filled 2022-04-05: qty 1

## 2022-04-05 MED ORDER — THROMBIN 5000 UNITS EX SOLR
CUTANEOUS | Status: AC
  Filled 2022-04-05: qty 5000

## 2022-04-05 MED ORDER — THROMBIN 5000 UNITS EX SOLR
CUTANEOUS | Status: AC
  Filled 2022-04-05: qty 10000

## 2022-04-05 MED ORDER — HYDROMORPHONE HCL 1 MG/ML IJ SOLN
0.5000 mg | INTRAMUSCULAR | Status: DC | PRN
  Administered 2022-04-05 (×2): 0.5 mg via INTRAVENOUS

## 2022-04-05 MED ORDER — ACETAMINOPHEN 500 MG PO TABS
1000.0000 mg | ORAL_TABLET | ORAL | Status: DC
  Filled 2022-04-05: qty 2

## 2022-04-05 MED ORDER — PHENYLEPHRINE HCL-NACL 20-0.9 MG/250ML-% IV SOLN
INTRAVENOUS | Status: AC
  Filled 2022-04-05: qty 500

## 2022-04-05 MED ORDER — FENTANYL CITRATE (PF) 100 MCG/2ML IJ SOLN
INTRAMUSCULAR | Status: AC
  Filled 2022-04-05: qty 2

## 2022-04-05 MED ORDER — 0.9 % SODIUM CHLORIDE (POUR BTL) OPTIME
TOPICAL | Status: DC | PRN
  Administered 2022-04-05: 1000 mL

## 2022-04-05 MED ORDER — PHENYLEPHRINE 80 MCG/ML (10ML) SYRINGE FOR IV PUSH (FOR BLOOD PRESSURE SUPPORT)
PREFILLED_SYRINGE | INTRAVENOUS | Status: DC | PRN
  Administered 2022-04-05: 240 ug via INTRAVENOUS
  Administered 2022-04-05: 160 ug via INTRAVENOUS

## 2022-04-05 MED ORDER — PROPOFOL 10 MG/ML IV BOLUS
INTRAVENOUS | Status: DC | PRN
  Administered 2022-04-05: 130 mg via INTRAVENOUS

## 2022-04-05 MED ORDER — FENTANYL CITRATE (PF) 250 MCG/5ML IJ SOLN
INTRAMUSCULAR | Status: DC | PRN
  Administered 2022-04-05: 50 ug via INTRAVENOUS
  Administered 2022-04-05 (×4): 25 ug via INTRAVENOUS
  Administered 2022-04-05: 50 ug via INTRAVENOUS

## 2022-04-05 MED ORDER — LIDOCAINE 2% (20 MG/ML) 5 ML SYRINGE
INTRAMUSCULAR | Status: DC | PRN
  Administered 2022-04-05: 100 mg via INTRAVENOUS

## 2022-04-05 MED ORDER — SODIUM CHLORIDE 0.9% FLUSH
3.0000 mL | INTRAVENOUS | Status: DC | PRN
Start: 2022-04-05 — End: 2022-04-06

## 2022-04-05 MED ORDER — SODIUM CHLORIDE 0.9% FLUSH: 3.0000 mL | Freq: Two times a day (BID) | INTRAVENOUS | Status: DC

## 2022-04-05 MED ORDER — ACETAMINOPHEN 325 MG PO TABS: 650.0000 mg | ORAL_TABLET | ORAL | Status: DC | PRN

## 2022-04-05 MED ORDER — GUAIFENESIN 200 MG PO TABS
400.0000 mg | ORAL_TABLET | Freq: Four times a day (QID) | ORAL | Status: DC | PRN
Start: 2022-04-05 — End: 2022-04-06

## 2022-04-05 MED ORDER — CHLORHEXIDINE GLUCONATE 0.12 % MT SOLN
15.0000 mL | Freq: Once | OROMUCOSAL | Status: AC
  Administered 2022-04-05: 15 mL via OROMUCOSAL
  Filled 2022-04-05: qty 15

## 2022-04-05 MED ORDER — ONDANSETRON HCL 4 MG/2ML IJ SOLN
INTRAMUSCULAR | Status: AC
  Filled 2022-04-05: qty 2

## 2022-04-05 MED ORDER — MORPHINE SULFATE (PF) 2 MG/ML IV SOLN
2.0000 mg | INTRAVENOUS | Status: DC | PRN
  Administered 2022-04-05 (×2): 2 mg via INTRAVENOUS
  Filled 2022-04-05 (×3): qty 1

## 2022-04-05 MED ORDER — LIDOCAINE 2% (20 MG/ML) 5 ML SYRINGE
INTRAMUSCULAR | Status: AC
  Filled 2022-04-05: qty 5

## 2022-04-05 MED ORDER — THROMBIN 5000 UNITS EX SOLR
OROMUCOSAL | Status: DC | PRN
  Administered 2022-04-05: 5 mL via TOPICAL

## 2022-04-05 MED ORDER — MIDAZOLAM HCL 2 MG/2ML IJ SOLN
INTRAMUSCULAR | Status: DC | PRN
  Administered 2022-04-05: 2 mg via INTRAVENOUS

## 2022-04-05 MED ORDER — PROPOFOL 10 MG/ML IV BOLUS
INTRAVENOUS | Status: AC
  Filled 2022-04-05: qty 20

## 2022-04-05 MED ORDER — BUPIVACAINE HCL (PF) 0.25 % IJ SOLN
INTRAMUSCULAR | Status: AC
  Filled 2022-04-05: qty 30

## 2022-04-05 MED ORDER — ONDANSETRON HCL 4 MG/2ML IJ SOLN
INTRAMUSCULAR | Status: DC | PRN
  Administered 2022-04-05: 4 mg via INTRAVENOUS

## 2022-04-05 MED ORDER — ALBUTEROL SULFATE (2.5 MG/3ML) 0.083% IN NEBU: 2.5000 mg | INHALATION_SOLUTION | Freq: Four times a day (QID) | RESPIRATORY_TRACT | Status: DC | PRN

## 2022-04-05 MED ORDER — MENTHOL 3 MG MT LOZG: 1.0000 | LOZENGE | OROMUCOSAL | Status: DC | PRN

## 2022-04-05 MED ORDER — OXYCODONE HCL 5 MG PO TABS
5.0000 mg | ORAL_TABLET | ORAL | Status: DC | PRN
  Administered 2022-04-05 – 2022-04-06 (×5): 10 mg via ORAL
  Filled 2022-04-05 (×5): qty 2

## 2022-04-05 MED ORDER — ONDANSETRON HCL 4 MG/2ML IJ SOLN: 4.0000 mg | Freq: Four times a day (QID) | INTRAMUSCULAR | Status: DC | PRN

## 2022-04-05 MED ORDER — METOPROLOL TARTRATE 25 MG PO TABS
50.0000 mg | ORAL_TABLET | Freq: Two times a day (BID) | ORAL | Status: DC
  Administered 2022-04-05 – 2022-04-06 (×2): 50 mg via ORAL
  Filled 2022-04-05 (×2): qty 2

## 2022-04-05 MED ORDER — KETAMINE HCL 50 MG/5ML IJ SOSY
PREFILLED_SYRINGE | INTRAMUSCULAR | Status: AC
  Filled 2022-04-05: qty 5

## 2022-04-05 MED ORDER — KETAMINE HCL 10 MG/ML IJ SOLN
INTRAMUSCULAR | Status: DC | PRN
  Administered 2022-04-05: 10 mg via INTRAVENOUS
  Administered 2022-04-05: 30 mg via INTRAVENOUS

## 2022-04-05 MED ORDER — PANTOPRAZOLE SODIUM 20 MG PO TBEC
20.0000 mg | DELAYED_RELEASE_TABLET | Freq: Every day | ORAL | Status: DC
Start: 2022-04-05 — End: 2022-04-06
  Administered 2022-04-05 – 2022-04-06 (×2): 20 mg via ORAL
  Filled 2022-04-05 (×2): qty 1

## 2022-04-05 MED ORDER — DEXAMETHASONE SODIUM PHOSPHATE 10 MG/ML IJ SOLN
INTRAMUSCULAR | Status: AC
  Filled 2022-04-05: qty 1

## 2022-04-05 SURGICAL SUPPLY — 57 items
ADH SKN CLS APL DERMABOND .7 (GAUZE/BANDAGES/DRESSINGS) ×1
APL SKNCLS STERI-STRIP NONHPOA (GAUZE/BANDAGES/DRESSINGS) ×1
BAG COUNTER SPONGE SURGICOUNT (BAG) ×3 IMPLANT
BAG SPNG CNTER NS LX DISP (BAG) ×1
BAND INSRT 18 STRL LF DISP RB (MISCELLANEOUS) ×2
BAND RUBBER #18 3X1/16 STRL (MISCELLANEOUS) ×6 IMPLANT
BASKET BONE COLLECTION (BASKET) ×1 IMPLANT
BENZOIN TINCTURE PRP APPL 2/3 (GAUZE/BANDAGES/DRESSINGS) ×3 IMPLANT
BUR CARBIDE MATCH 3.0 (BURR) ×3 IMPLANT
CANISTER SUCT 3000ML PPV (MISCELLANEOUS) ×3 IMPLANT
DERMABOND ADVANCED (GAUZE/BANDAGES/DRESSINGS) ×1
DERMABOND ADVANCED .7 DNX12 (GAUZE/BANDAGES/DRESSINGS) IMPLANT
DEVICE EDNSKLTN TC NNLCK MED 8 (Cage) IMPLANT
DRAPE C-ARM 42X72 X-RAY (DRAPES) ×6 IMPLANT
DRAPE LAPAROTOMY 100X72 PEDS (DRAPES) ×3 IMPLANT
DRAPE MICROSCOPE LEICA (MISCELLANEOUS) ×3 IMPLANT
DURAPREP 6ML APPLICATOR 50/CS (WOUND CARE) ×3 IMPLANT
ELECT COATED BLADE 2.86 ST (ELECTRODE) ×3 IMPLANT
ELECT REM PT RETURN 9FT ADLT (ELECTROSURGICAL) ×2
ELECTRODE REM PT RTRN 9FT ADLT (ELECTROSURGICAL) ×2 IMPLANT
ENDOSKELTON IMPLANT TC MED 8MM (Cage) ×2 IMPLANT
GAUZE 4X4 16PLY ~~LOC~~+RFID DBL (SPONGE) IMPLANT
GLOVE BIO SURGEON STRL SZ7 (GLOVE) ×1 IMPLANT
GLOVE BIO SURGEON STRL SZ8 (GLOVE) ×3 IMPLANT
GLOVE BIOGEL PI IND STRL 7.0 (GLOVE) IMPLANT
GLOVE BIOGEL PI INDICATOR 7.0 (GLOVE) ×1
GOWN STRL REUS W/ TWL LRG LVL3 (GOWN DISPOSABLE) IMPLANT
GOWN STRL REUS W/ TWL XL LVL3 (GOWN DISPOSABLE) IMPLANT
GOWN STRL REUS W/TWL 2XL LVL3 (GOWN DISPOSABLE) ×3 IMPLANT
GOWN STRL REUS W/TWL LRG LVL3 (GOWN DISPOSABLE) ×2
GOWN STRL REUS W/TWL XL LVL3 (GOWN DISPOSABLE)
HEMOSTAT POWDER KIT SURGIFOAM (HEMOSTASIS) ×3 IMPLANT
KIT BASIN OR (CUSTOM PROCEDURE TRAY) ×3 IMPLANT
KIT TURNOVER KIT B (KITS) ×3 IMPLANT
NDL HYPO 25X1 1.5 SAFETY (NEEDLE) ×2 IMPLANT
NDL SPNL 20GX3.5 QUINCKE YW (NEEDLE) ×2 IMPLANT
NEEDLE HYPO 25X1 1.5 SAFETY (NEEDLE) ×2 IMPLANT
NEEDLE SPNL 20GX3.5 QUINCKE YW (NEEDLE) ×2 IMPLANT
NS IRRIG 1000ML POUR BTL (IV SOLUTION) ×3 IMPLANT
PACK LAMINECTOMY NEURO (CUSTOM PROCEDURE TRAY) ×3 IMPLANT
PAD ARMBOARD 7.5X6 YLW CONV (MISCELLANEOUS) ×3 IMPLANT
PIN DISTRACTION 14MM (PIN) IMPLANT
PLATE ELITE VISION 25MM (Plate) ×1 IMPLANT
PUTTY DBF 1CC CORTICAL FIBERS (Putty) ×1 IMPLANT
RASP 3.0MM (RASP) ×1 IMPLANT
SCREW 4.0X15MM (Screw) ×2 IMPLANT
SCREW RESCUE 13MM (Screw) ×2 IMPLANT
SCREW RESCUE VAR ANGLE 13 (Screw) IMPLANT
SCREW VA SD 4.5X15 (Screw) ×1 IMPLANT
SPONGE INTESTINAL PEANUT (DISPOSABLE) ×3 IMPLANT
SPONGE SURGIFOAM ABS GEL SZ50 (HEMOSTASIS) ×1 IMPLANT
STRIP CLOSURE SKIN 1/2X4 (GAUZE/BANDAGES/DRESSINGS) ×3 IMPLANT
SUT VIC AB 3-0 SH 8-18 (SUTURE) ×3 IMPLANT
SUT VICRYL 4-0 PS2 18IN ABS (SUTURE) IMPLANT
TOWEL GREEN STERILE (TOWEL DISPOSABLE) ×3 IMPLANT
TOWEL GREEN STERILE FF (TOWEL DISPOSABLE) ×3 IMPLANT
WATER STERILE IRR 1000ML POUR (IV SOLUTION) ×3 IMPLANT

## 2022-04-05 NOTE — Evaluation (Signed)
Occupational Therapy Evaluation and Discharge Patient Details Name: Pamela Hatfield MRN: [id number] DOB: 1950-11-09 Today's Date: 04/05/2022   History of Present Illness Pt is a 71 yo female s/p decompressive anterior cervical discectomy C3-4 and anterior fusion.   Clinical Impression   This 71 yo female admitted and underwent above presents to acute OT with PLOF of being independent with basic ADLs and IADLs. Currently she is setup/S with these tasks and S with RW when up and about (she normally uses [facility]). All education completed, we will D/C her from acute OT.      Recommendations for follow up therapy are one component of a multi-disciplinary discharge planning process, led by the attending physician.  Recommendations may be updated based on patient status, additional functional criteria and insurance authorization.   Follow Up Recommendations  No OT follow up    Assistance Recommended at Discharge Intermittent Supervision/Assistance  Patient can return home with the following A little help with bathing/dressing/bathroom;Assistance with cooking/housework;Help with stairs or ramp for entrance;Assist for transportation    Functional Status Assessment  Patient has had a recent decline in their functional status and demonstrates the ability to make significant improvements in function in a reasonable and predictable amount of time. (no further skilled OT needs)  Equipment Recommendations  Other (comment) (RW)       Precautions / Restrictions Precautions Precautions: Cervical Precaution Booklet Issued: No Required Braces or Orthoses: Cervical Brace Cervical Brace: Soft collar Restrictions Weight Bearing Restrictions: No      Mobility Bed Mobility Overal bed mobility: Modified Independent             General bed mobility comments: Plans on sleeping in a recliner (as she has the past 2 years)    Transfers Overall transfer level: Needs assistance Equipment used: Rolling  walker (2 wheels) Transfers: Sit to/from Stand Sit to Stand: Supervision                  Balance Overall balance assessment: Mild deficits observed, not formally tested                                         ADL either performed or assessed with clinical judgement   ADL Overall ADL's : Needs assistance/impaired Eating/Feeding: Independent;Set up   Grooming: Set up;Supervision/safety;Standing   Upper Body Bathing: Supervision/ safety;Set up;Sitting   Lower Body Bathing: Set up;Supervison/ safety;Sit to/from stand   Upper Body Dressing : Set up;Supervision/safety;Sitting Upper Body Dressing Details (indicate cue type and reason): educated on donning a pull over shirt Lower Body Dressing: Set up;Supervision/safety;Sit to/from stand   Toilet Transfer: Supervision/safety;Ambulation;Rolling walker (2 wheels)   Toileting- Clothing Manipulation and Hygiene: Supervision/safety;Sit to/from stand               Vision Patient Visual Report: No change from baseline              Pertinent Vitals/Pain Pain Assessment Pain Assessment: 0-10 Pain Score: 2  Pain Location: neck Pain Descriptors / Indicators: Aching, Sore Pain Intervention(s): Limited activity within patient's tolerance, Monitored during session, Repositioned     Hand Dominance Right   Extremity/Trunk Assessment Upper Extremity Assessment Upper Extremity Assessment: Overall WFL for tasks assessed           Communication Communication Communication: No difficulties   Cognition Arousal/Alertness: Awake/alert Behavior During Therapy: WFL for tasks assessed/performed Overall Cognitive Status: Within Functional Limits  for tasks assessed                                                  Home Living Family/patient expects to be discharged to:: Private residence Living Arrangements: Alone Available Help at Discharge: Family;Available 24 hours/day Type of Home:  House Home Access: Level entry     Home Layout: One level     Bathroom Shower/Tub: Teacher, early years/pre: Standard     Home Equipment: None          Prior Functioning/Environment Prior Level of Function : Independent/Modified Independent                        OT Problem List: Decreased range of motion;Impaired balance (sitting and/or standing);Pain         OT Goals(Current goals can be found in the care plan section) Acute Rehab OT Goals Patient Stated Goal: to go home tomorrow         AM-PAC OT "6 Clicks" Daily Activity     Outcome Measure Help from another person eating meals?: None Help from another person taking care of personal grooming?: A Little Help from another person toileting, which includes using toliet, bedpan, or urinal?: A Little Help from another person bathing (including washing, rinsing, drying)?: A Little Help from another person to put on and taking off regular upper body clothing?: A Little Help from another person to put on and taking off regular lower body clothing?: A Little 6 Click Score: 19   End of Session Equipment Utilized During Treatment: Rolling walker (2 wheels)  Activity Tolerance: Patient tolerated treatment well Patient left: in bed;with call bell/phone within reach;with family/visitor present  OT Visit Diagnosis: Unsteadiness on feet (R26.81);Pain Pain - part of body:  (incisiona;)                Time: 0263-7858 OT Time Calculation (min): 27 min Charges:  OT General Charges $OT Visit: 1 Visit OT Evaluation $OT Eval Moderate Complexity: 1 Mod OT Treatments $Self Care/Home Management : 8-22 mins Golden Circle, OTR/L Acute Rehab Services Aging [facility] [phone] Office [phone]    Almon Register 04/05/2022, 6:23 PM

## 2022-04-05 NOTE — Transfer of Care (Signed)
Immediate Anesthesia Transfer of Care Note  Patient: Pamela Hatfield  Procedure(s) Performed: Anterior Cervical Discectomy and Fusion Cervical Three-Four, Removal of Plate Cervical Four-Five  Patient Location: PACU  Anesthesia Type:General  Level of Consciousness: drowsy and patient cooperative  Airway & Oxygen Therapy: Patient Spontanous Breathing  Post-op Assessment: Report given to RN and Post -op Vital signs reviewed and stable  Post vital signs: Reviewed and stable  Last Vitals:  Vitals Value Taken Time  BP 127/58 04/05/22 1020  Temp    Pulse 81 04/05/22 1023  Resp 20 04/05/22 1023  SpO2 98 % 04/05/22 1023  Vitals shown include unvalidated device data.  Last Pain:  Vitals:   04/05/22 0614  TempSrc:   PainSc: 8       Patients Stated Pain Goal: 2 (09/03/40 1464)  Complications: No notable events documented.

## 2022-04-05 NOTE — H&P (Signed)
Subjective:   Patient is a 71 y.o. female admitted for acdf. The patient first presented to me with complaints of neck pain, shooting pains in the arm(s), and numbness of the arm(s). Onset of symptoms was several months ago. The pain is described as aching and stabbing and occurs all day. The pain is rated severe, and is located in the neck and radiates to the arms. The symptoms have been progressive. Symptoms are exacerbated by extending head backwards, and are relieved by none.  Previous work up includes MRI of cervical spine, results: spinal stenosis.  Past Medical History:  Diagnosis Date   Accessory spleen    Arthritis    multiple areas of her body   Asthma    Cancer ([place])    located on her aorta and small intestines- on Octreodtide '10mg'$  IM every 28 days for this   Depression    Diabetes mellitus    diet controlled   Dyspnea    today 11/09/2017, reports that her breathing is normal for her    Dysrhythmia    hx rapid heart beat   Fibromyalgia    GERD (gastroesophageal reflux disease)    History of hiatal hernia    Hyperlipidemia    Hypertension    Myalgia and myositis, unspecified    Polyneuropathy    Sleep apnea    no cpap used, pt does not like, use to use CPAP but due to insurance had to return the device    Weight loss 02/25/2011    Past Surgical History:  Procedure Laterality Date   ABDOMINAL HYSTERECTOMY  yrs ago   ovaries remain, done because of endometriosis   ANTERIOR CERVICAL DECOMP/DISCECTOMY FUSION N/A 11/16/2017   Procedure: Anterior Cervical Decompression/Discectomy Fusion - Cervical four-Cervical five - Cervical five-Cervical six - Cervical six-Cervical seven;  Surgeon: Eustace Moore, MD;  Location: [place];  Service: Neurosurgery;  Laterality: N/A;   BACK SURGERY  yrs ago   lower back   BREAST BIOPSY Bilateral    benign biopsies   BREAST LUMPECTOMY     both breast   CARDIAC CATHETERIZATION     CARPAL TUNNEL RELEASE Right 2003   CARPAL TUNNEL RELEASE Left  02/12/2020   Procedure: CARPAL TUNNEL RELEASE;  Surgeon: Carole Civil, MD;  Location: [facility];  Service: Orthopedics;  Laterality: Left;   CHOLECYSTECTOMY  yrs ago   COLONOSCOPY  06/2012   normal   ESOPHAGEAL DILATION  06/14/2012   Procedure: ESOPHAGEAL DILATION;  Surgeon: Daneil Dolin, MD;  Location: [facility];  Service: Endoscopy;;   EUS  07/13/2012   Procedure: UPPER ENDOSCOPIC ULTRASOUND (EUS) LINEAR;  Surgeon: Milus Banister, MD;  Location: [facility];  Service: Endoscopy;  Laterality: N/A;   FLEXIBLE SIGMOIDOSCOPY N/A 03/24/2022   Procedure: FLEXIBLE SIGMOIDOSCOPY;  Surgeon: Daneil Dolin, MD;  Location: [facility];  Service: Endoscopy;  Laterality: N/A;   NOSE SURGERY  yrs ago   ROTATOR CUFF REPAIR  yrs ago   left    Allergies  Allergen Reactions   Nickel Swelling   Sulfa Antibiotics Hives   Clarithromycin Rash   Codeine Itching and Other (See Comments)    Headaches   Lamotrigine Rash   Tape Itching    Social History   Tobacco Use   Smoking status: Former    Packs/day: 0.50    Years: 20.00    Total pack years: 10.00    Types: Cigarettes    Quit date: 09/13/1992    Years since  quitting: 29.5   Smokeless tobacco: Never   Tobacco comments:    quit about 25 + years ago  Substance Use Topics   Alcohol use: No    Family History  Problem Relation Age of Onset   Cancer Mother        died age 103, ?not sure what kind, metastatic at time of diagnosis   Colon cancer Father    Colon cancer Maternal Aunt    Colon cancer Maternal Uncle    Prior to Admission medications   Medication Sig Start Date End Date Taking? Authorizing Provider  albuterol (VENTOLIN HFA) 108 (90 Base) MCG/ACT inhaler Inhale 2 puffs into the lungs every 6 (six) hours as needed for wheezing or shortness of breath.  01/08/20  Yes [provider]  aspirin EC 81 MG tablet Take 1 tablet (81 mg total) by mouth daily. Swallow whole. 01/13/22  Yes Early Osmond, MD  atorvastatin  (LIPITOR) 40 MG tablet Take 1 tablet (40 mg total) by mouth daily. 01/13/22  Yes Early Osmond, MD  buprenorphine (SUBUTEX) 8 MG SUBL SL tablet Place 8 mg under the tongue 2 (two) times daily. 01/27/22  Yes [provider]  cetirizine (ZYRTEC) 10 MG tablet Take 10 mg by mouth daily as needed for allergies.    Yes [provider]  Cholecalciferol (VITAMIN D3 PO) Take 1 tablet by mouth daily.   Yes [provider]  clotrimazole-betamethasone (LOTRISONE) cream Apply to affected area 2 times daily until symptoms improve. 03/29/22  Yes Leath-Warren, Alda Lea, NP  cyclobenzaprine (FLEXERIL) 10 MG tablet Take 10 mg by mouth at bedtime as needed for muscle spasms.   Yes [provider]  DULoxetine (CYMBALTA) 30 MG capsule Take 30 mg by mouth daily. 12/02/21  Yes [provider]  enalapril (VASOTEC) 10 MG tablet Take 10 mg by mouth daily.    Yes [provider]  fluticasone (FLONASE) 50 MCG/ACT nasal spray Place 1-2 sprays into both nostrils daily as needed for allergies or rhinitis.   Yes [provider]  gabapentin (NEURONTIN) 600 MG tablet Take 600 mg by mouth 4 (four) times daily. 04/10/20  Yes [provider]  guaifenesin (HUMIBID E) 400 MG TABS tablet Take 400 mg by mouth in the morning and at bedtime.   Yes [provider]  levETIRAcetam (KEPPRA) 750 MG tablet Take 1 tablet (750 mg total) by mouth 2 (two) times daily. Patient taking differently: Take 750 mg by mouth daily. 02/09/22  Yes Sater, Nanine Means, MD  linaclotide (LINZESS) 145 MCG CAPS capsule Take 145 mcg by mouth daily before breakfast.   Yes [provider]  metoprolol (LOPRESSOR) 50 MG tablet Take 50 mg by mouth 2 (two) times daily.   Yes [provider]  montelukast (SINGULAIR) 10 MG tablet Take 10 mg by mouth at bedtime.    Yes [provider]  Multiple Vitamin (MULTIVITAMIN WITH MINERALS) TABS tablet Take 1 tablet by mouth daily.    Yes [provider]  Multiple Vitamins-Minerals (HAIR/SKIN/NAILS) CAPS Take 2 capsules by mouth daily.   Yes [provider]  nystatin (MYCOSTATIN/NYSTOP) powder Apply 1 Application topically 3 (three) times daily. Apply 3 times daily until symptoms improve. 03/29/22  Yes Leath-Warren, Alda Lea, NP  ondansetron (ZOFRAN) 4 MG tablet Take 4 mg by mouth every 8 (eight) hours as needed for nausea or vomiting.   Yes [provider]  oxyCODONE-acetaminophen (PERCOCET/ROXICET) 5-325 MG tablet Take 1 tablet by mouth every 8 (eight)  hours as needed for moderate pain.   Yes [provider]  pantoprazole (PROTONIX) 20 MG tablet Take 20 mg by mouth daily.   Yes [provider]  polyethylene glycol powder (GLYCOLAX/MIRALAX) 17 GM/SCOOP powder Take 17 g by mouth daily as needed for mild constipation or moderate constipation.   Yes [provider]  ibuprofen (ADVIL) 800 MG tablet Take 1 tablet (800 mg total) by mouth every 8 (eight) hours as needed. Patient taking differently: Take 800 mg by mouth as needed. 02/12/20   Carole Civil, MD  naloxone [facility]) 2 MG/2ML injection Place 1 mg into the nose once. 11/12/20   [provider]  octreotide (SANDOSTATIN LAR) 10 MG injection Inject 10 mg into the muscle every 28 (twenty-eight) days.    [provider]  trolamine salicylate (BLUE-EMU HEMP) 10 % cream Apply 1 Application topically as needed for muscle pain.    [provider]     Review of Systems  Positive ROS: neg  All other systems have been reviewed and were otherwise negative with the exception of those mentioned in the HPI and as above.  Objective: Vital signs in last 24 hours: Temp:  [98.5 F (36.9 C)] 98.5 F (36.9 C) (07/24 0546) Pulse Rate:  [63] 63 (07/24 0546) Resp:  [17] 17 (07/24 0546) BP: (154)/(82) 154/82 (07/24 0546) SpO2:  [98 %] 98 % (07/24 0546) Weight:  [81.2 kg] 81.2 kg (07/24 0546)  General  Appearance: Alert, cooperative, no distress, appears stated age Head: Normocephalic, without obvious abnormality, atraumatic Eyes: PERRL, conjunctiva/corneas clear, EOM's intact      Neck: Supple, symmetrical, trachea midline, Back: Symmetric, no curvature, ROM normal, no CVA tenderness Lungs:  respirations unlabored Heart: Regular rate and rhythm Abdomen: Soft, non-tender Extremities: Extremities normal, atraumatic, no cyanosis or edema Pulses: 2+ and symmetric all extremities Skin: Skin color, texture, turgor normal, no rashes or lesions  NEUROLOGIC:  Mental status: Alert and oriented x4, no aphasia, good attention span, fund of knowledge and memory  Motor Exam - grossly normal Sensory Exam - grossly normal Reflexes: 3+ Coordination - grossly normal Gait - not tested Balance - not tested Cranial Nerves: I: smell Not tested  II: visual acuity  OS: nl    OD: nl  II: visual fields Full to confrontation  II: pupils Equal, round, reactive to light  III,VII: ptosis None  III,IV,VI: extraocular muscles  Full ROM  V: mastication Normal  V: facial light touch sensation  Normal  V,VII: corneal reflex  Present  VII: facial muscle function - upper  Normal  VII: facial muscle function - lower Normal  VIII: hearing Not tested  IX: soft palate elevation  Normal  IX,X: gag reflex Present  XI: trapezius strength  5/5  XI: sternocleidomastoid strength 5/5  XI: neck flexion strength  5/5  XII: tongue strength  Normal    Data Review Lab Results  Component Value Date   WBC 11.3 (H) 03/31/2022   HGB 11.5 (L) 03/31/2022   HCT 36.8 03/31/2022   MCV 89.5 03/31/2022   PLT 342 03/31/2022   Lab Results  Component Value Date   NA 139 03/31/2022   K 4.3 03/31/2022   CL 102 03/31/2022   CO2 30 03/31/2022   BUN 7 (L) 03/31/2022   CREATININE 1.00 03/31/2022   GLUCOSE 113 (H) 03/31/2022   Lab Results  Component Value Date   INR 1.1 03/31/2022    Assessment:   Cervical neck pain  with herniated nucleus pulposus/  spondylosis/ stenosis at C3-4. Estimated body mass index is 29.79 kg/m as calculated from the following:   Height as of this encounter: '5\' 5"'$  (1.651 m).   Weight as of this encounter: 81.2 kg.  Patient has failed conservative therapy. Planned surgery : ACDF C3-4  Plan:   I explained the condition and procedure to the patient and answered any questions.  Patient wishes to proceed with procedure as planned. Understands risks/ benefits/ and expected or typical outcomes.  Eustace Moore 04/05/2022 7:26 AM

## 2022-04-05 NOTE — Anesthesia Procedure Notes (Signed)
Procedure Name: Intubation Date/Time: 04/05/2022 8:03 AM  Performed by: Thelma Comp, CRNAPre-anesthesia Checklist: Patient identified, Emergency Drugs available, Suction available and Patient being monitored Patient Re-evaluated:Patient Re-evaluated prior to induction Oxygen Delivery Method: Circle System Utilized Preoxygenation: Pre-oxygenation with 100% oxygen Induction Type: IV induction Ventilation: Mask ventilation without difficulty Laryngoscope Size: Glidescope and 3 Grade View: Grade I Tube type: Oral Tube size: 7.0 mm Number of attempts: 1 Airway Equipment and Method: Rigid stylet and Video-laryngoscopy Placement Confirmation: ETT inserted through vocal cords under direct vision, positive ETCO2 and breath sounds checked- equal and bilateral Secured at: 21 cm Tube secured with: Tape Dental Injury: Teeth and Oropharynx as per pre-operative assessment

## 2022-04-05 NOTE — Op Note (Signed)
04/05/2022  10:02 AM  PATIENT:  Pamela Hatfield  71 y.o. female  PRE-OPERATIVE DIAGNOSIS: Cervical spinal stenosis C3-4 with cervical spondylitic myelopathy  POST-OPERATIVE DIAGNOSIS:  same  PROCEDURE:  1. Decompressive anterior cervical discectomy C3-4, 2. Anterior cervical arthrodesis C3-4 utilizing a Titan interbody cage packed with locally harvested morcellized autologous bone graft and DBM, 3. Anterior cervical plating C3-4 utilizing a Atlantis plate, 4.  Removal of anterior cervical plate C4-C7 and exploration of fusion C4-C7  SURGEON:  Sherley Bounds, MD  ASSISTANTS: Glenford Peers FNP  ANESTHESIA:   General  EBL: 50 ml  Total I/O In: -  Out: 50 [Blood:50]  BLOOD ADMINISTERED: none  DRAINS: none  SPECIMEN:  none  INDICATION FOR PROCEDURE: This patient presented with numbness in her hands with neck pain and shoulder pain and difficulty with gait. Imaging showed very adjacent level stenosis at C3-4 with signal change in the spinal cord consistent with myelopathy. The patient tried conservative measures without relief. Pain was debilitating. Recommended ACDF with plating. Patient understood the risks, benefits, and alternatives and potential outcomes and wished to proceed.  PROCEDURE DETAILS: Patient was brought to the operating room placed under general endotracheal anesthesia. Patient was placed in the supine position on the operating room table. The neck was prepped with Duraprep and draped in a sterile fashion.   Three cc of local anesthesia was injected and a transverse incision was made on the right side of the neck.  Dissection was carried down thru the subcutaneous tissue and the platysma was  elevated, opened, and undermined with Metzenbaum scissors.  Dissection was then carried out thru an avascular plane leaving the sternocleidomastoid carotid artery and jugular vein laterally and the trachea and esophagus medially with the assistance of my nurse practitioner. The ventral  aspect of the vertebral column was identified and the old plate was identified.  The soft tissue was removed bluntly and then the locking mechanisms were opened and the screws were removed from C4, C5, C6 and C7.  The plate was removed.  We explored the fusions to make sure that she has solid arthrodesis as best we could.  And a localizing x-ray was taken. The C3-4 level was identified and all in the room agreed with the level. The longus colli muscles were then elevated and the retractor was placed with the assistance of my nurse practitioner. The annulus was incised and the disc space entered. Discectomy was performed with micro-curettes and pituitary rongeurs. I then used the high-speed drill to drill the endplates down to the level of the posterior longitudinal ligament. The drill shavings were saved in a mucous trap for later arthrodesis. The operating microscope was draped and brought into the field provided additional magnification, illumination and visualization. Discectomy was continued posteriorly thru the disc space. Posterior longitudinal ligament was opened with a nerve hook, and then removed along with disc herniation and osteophytes, decompressing the spinal canal and thecal sac. We then continued to remove osteophytic overgrowth and disc material decompressing the neural foramina and exiting nerve roots bilaterally. The scope was angled up and down to help decompress and undercut the vertebral bodies. Once the decompression was completed we could pass a nerve hook circumferentially to assure adequate decompression in the midline and in the neural foramina. So by both visualization and palpation we felt we had an adequate decompression of the neural elements. We then measured the height of the intravertebral disc space and selected a 8 millimeter titanium interbody cage packed with autograft and  DBM. It was then gently positioned in the intravertebral disc space(s) and countersunk. I then used a 25 mm  Atlantis plate and placed 15 mm variable angle screws into the vertebral bodies of each level and locked them into position. The wound was irrigated with bacitracin solution, checked for hemostasis which was established and confirmed. Once meticulous hemostasis was achieved, we then proceeded with closure with the assistance of my nurse practitioner. The platysma was closed with interrupted 3-0 undyed Vicryl suture, the subcuticular layer was closed with interrupted 3-0 undyed Vicryl suture. The skin edges were approximated with steristrips. The drapes were removed. A sterile dressing was applied. The patient was then awakened from general anesthesia and transferred to the recovery room in stable condition. At the end of the procedure all sponge, needle and instrument counts were correct.   PLAN OF CARE: Admit for overnight observation  PATIENT DISPOSITION:  PACU - hemodynamically stable.   Delay start of Pharmacological VTE agent (>24hrs) due to surgical blood loss or risk of bleeding:  yes

## 2022-04-05 NOTE — Anesthesia Postprocedure Evaluation (Signed)
Anesthesia Post Note  Patient: Pamela Hatfield  Procedure(s) Performed: Anterior Cervical Discectomy and Fusion Cervical Three-Four, Removal of Plate Cervical Four-Five     Patient location during evaluation: PACU Anesthesia Type: General Level of consciousness: awake and alert Pain management: pain level controlled Vital Signs Assessment: post-procedure vital signs reviewed and stable Respiratory status: spontaneous breathing, nonlabored ventilation, respiratory function stable and patient connected to nasal cannula oxygen Cardiovascular status: blood pressure returned to baseline and stable Postop Assessment: no apparent nausea or vomiting Anesthetic complications: no   No notable events documented.  Last Vitals:  Vitals:   04/05/22 1126 04/05/22 1152  BP: (!) 107/55 123/62  Pulse: 71 74  Resp: 13 18  Temp: 36.9 C 37.2 C  SpO2: 96% 93%    Last Pain:  Vitals:   04/05/22 1155  TempSrc:   PainSc: 10-Worst pain ever                 Jozef Eisenbeis L Gredmarie Delange

## 2022-04-06 ENCOUNTER — Encounter (HOSPITAL_COMMUNITY): Payer: Self-pay | Admitting: Neurological Surgery

## 2022-04-06 LAB — GLUCOSE, CAPILLARY: Glucose-Capillary: 185 mg/dL — ABNORMAL HIGH (ref 70–99)

## 2022-04-06 MED ORDER — OXYCODONE-ACETAMINOPHEN 5-325 MG PO TABS: 1.0000 | ORAL_TABLET | ORAL | 0 refills | Status: DC | PRN

## 2022-04-06 MED ORDER — INSULIN ASPART 100 UNIT/ML IJ SOLN
0.0000 [IU] | Freq: Every day | INTRAMUSCULAR | Status: DC
  Administered 2022-04-06: 2 [IU] via SUBCUTANEOUS

## 2022-04-06 MED ORDER — METHOCARBAMOL 750 MG PO TABS: 750.0000 mg | ORAL_TABLET | Freq: Four times a day (QID) | ORAL | 0 refills | Status: DC

## 2022-04-06 MED ORDER — INSULIN ASPART 100 UNIT/ML IJ SOLN
0.0000 [IU] | Freq: Three times a day (TID) | INTRAMUSCULAR | Status: DC
  Administered 2022-04-06: 3 [IU] via SUBCUTANEOUS

## 2022-04-06 MED FILL — Thrombin For Soln 5000 Unit: CUTANEOUS | Qty: 2 | Status: AC

## 2022-04-06 NOTE — Evaluation (Signed)
Physical Therapy Evaluation  Patient Details Name: Pamela Hatfield MRN: [id number] DOB: 04-16-1951 Today's Date: 04/06/2022  History of Present Illness  Pt is a 71 yo female s/p decompressive anterior cervical discectomy C3-4 and anterior fusion.  Clinical Impression  Pt admitted with above diagnosis. At the time of PT eval, pt was able to demonstrate transfers and ambulation with gross min guard assist and RW for support. Pt was educated on precautions, brace application/wearing schedule, appropriate activity progression, and car transfer. Pt currently with functional limitations due to the deficits listed below (see PT Problem List). Pt will benefit from skilled PT to increase their independence and safety with mobility to allow discharge to the venue listed below.         Recommendations for follow up therapy are one component of a multi-disciplinary discharge planning process, led by the attending physician.  Recommendations may be updated based on patient status, additional functional criteria and insurance authorization.  Follow Up Recommendations No PT follow up      Assistance Recommended at Discharge PRN  Patient can return home with the following  A little help with walking and/or transfers;A little help with bathing/dressing/bathroom;Assistance with cooking/housework;Assist for transportation    Equipment Recommendations Rolling walker (2 wheels)  Recommendations for Other Services       Functional Status Assessment Patient has had a recent decline in their functional status and demonstrates the ability to make significant improvements in function in a reasonable and predictable amount of time.     Precautions / Restrictions Precautions Precautions: Cervical Precaution Booklet Issued: Yes (comment) Precaution Comments: Reviewed handout and pt was cued for precautions during functional mobility. Required Braces or Orthoses: Cervical Brace Cervical Brace: Soft collar;For  comfort Restrictions Weight Bearing Restrictions: No      Mobility  Bed Mobility               General bed mobility comments: Pt was received sitting up EOB when PT arrived. Verbally reviewed log roll however pt reports she has been sleeping in the recliner.    Transfers Overall transfer level: Needs assistance Equipment used: Rolling walker (2 wheels) Transfers: Sit to/from Stand Sit to Stand: Supervision           General transfer comment: VC's for hand placement on seated surface for safety and improved posture during transfer.    Ambulation/Gait Ambulation/Gait assistance: Min guard Gait Distance (Feet): 200 Feet Assistive device: Rolling walker (2 wheels) Gait Pattern/deviations: Step-through pattern, Decreased stride length, Trunk flexed Gait velocity: Decreased Gait velocity interpretation: 1.31 - 2.62 ft/sec, indicative of limited community ambulator   General Gait Details: VC's for improved posture, closer walker proximity, and forward gaze. No assist required but close guard provided for safety.  Stairs            Wheelchair Mobility    Modified Rankin (Stroke Patients Only)       Balance Overall balance assessment: Mild deficits observed, not formally tested                                           Pertinent Vitals/Pain Pain Assessment Pain Assessment: Faces Faces Pain Scale: Hurts little more Pain Location: neck Pain Descriptors / Indicators: Sore, Operative site guarding Pain Intervention(s): Limited activity within patient's tolerance, Monitored during session, Repositioned    Home Living Family/patient expects to be discharged to:: Private residence Living Arrangements: Alone  Available Help at Discharge: Family;Available 24 hours/day Type of Home: House Home Access: Level entry       Home Layout: One level Home Equipment: None      Prior Function Prior Level of Function : Independent/Modified  Independent                     Hand Dominance   Dominant Hand: Right    Extremity/Trunk Assessment   Upper Extremity Assessment Upper Extremity Assessment: Overall WFL for tasks assessed    Lower Extremity Assessment Lower Extremity Assessment: Generalized weakness    Cervical / Trunk Assessment Cervical / Trunk Assessment: Neck Surgery  Communication   Communication: No difficulties  Cognition Arousal/Alertness: Awake/alert Behavior During Therapy: WFL for tasks assessed/performed Overall Cognitive Status: Within Functional Limits for tasks assessed                                          General Comments      Exercises     Assessment/Plan    PT Assessment Patient needs continued PT services  PT Problem List Decreased strength;Decreased activity tolerance;Decreased balance;Decreased mobility;Decreased knowledge of use of DME;Decreased safety awareness;Decreased knowledge of precautions;Pain       PT Treatment Interventions DME instruction;Gait training;Stair training;Functional mobility training;Therapeutic activities;Therapeutic exercise;Balance training;Patient/family education    PT Goals (Current goals can be found in the Care Plan section)  Acute Rehab PT Goals Patient Stated Goal: Home today, be able to manage at her home alone PT Goal Formulation: With patient/family Time For Goal Achievement: 04/13/22 Potential to Achieve Goals: Good    Frequency Min 5X/week     Co-evaluation               AM-PAC PT "6 Clicks" Mobility  Outcome Measure Help needed turning from your back to your side while in a flat bed without using bedrails?: A Little Help needed moving from lying on your back to sitting on the side of a flat bed without using bedrails?: A Little Help needed moving to and from a bed to a chair (including a wheelchair)?: A Little Help needed standing up from a chair using your arms (e.g., wheelchair or bedside  chair)?: A Little Help needed to walk in hospital room?: A Little Help needed climbing 3-5 steps with a railing? : A Little 6 Click Score: 18    End of Session Equipment Utilized During Treatment: Gait belt Activity Tolerance: Patient tolerated treatment well Patient left: in bed;with call bell/phone within reach;with family/visitor present Nurse Communication: Mobility status PT Visit Diagnosis: Unsteadiness on feet (R26.81);Pain Pain - part of body:  (neck)    Time: 7322-0254 PT Time Calculation (min) (ACUTE ONLY): 20 min   Charges:   PT Evaluation $PT Eval Low Complexity: 1 Low          Pamela Hatfield, PT, DPT Acute Rehabilitation Services Secure Chat Preferred Office: [phone]   Pamela Hatfield 04/06/2022, 10:08 AM

## 2022-04-06 NOTE — Plan of Care (Signed)
Pt and daughter given D/C instructions with verbal understanding. Rx's were sent to the pharmacy by MD. Pt's incision is clean and dry with no sign of infection. Pt's IV was removed prior to D/C. Pt received RW from [facility] per MD order. Pt D/C'd home via wheelchair per MD order. Pt is stable @ D/C and has no other needs at this time. Holli Humbles, RN

## 2022-04-06 NOTE — Discharge Summary (Signed)
Physician Discharge Summary  Patient ID: Pamela Hatfield MRN: [id number] DOB/AGE: March 31, 1951 71 y.o.  Admit date: 04/05/2022 Discharge date: 04/06/2022  Admission Diagnoses:  Cervical spinal stenosis C3-4 with cervical spondylitic myelopathy      Discharge Diagnoses: same   Discharged Condition: good  Hospital Course: The patient was admitted on 04/05/2022 and taken to the operating room where the patient underwent acdf C3-4. The patient tolerated the procedure well and was taken to the recovery room and then to the floor in stable condition. The hospital course was routine. There were no complications. The wound remained clean dry and intact. Pt had appropriate neck soreness. No complaints of arm pain or new N/T/W. The patient remained afebrile with stable vital signs, and tolerated a regular diet. The patient continued to increase activities, and pain was well controlled with oral pain medications.   Consults: None  Significant Diagnostic Studies:  Results for orders placed or performed during the hospital encounter of 04/05/22  Glucose, capillary  Result Value Ref Range   Glucose-Capillary 104 (H) 70 - 99 mg/dL  Glucose, capillary  Result Value Ref Range   Glucose-Capillary 164 (H) 70 - 99 mg/dL  Glucose, capillary  Result Value Ref Range   Glucose-Capillary 211 (H) 70 - 99 mg/dL  Glucose, capillary  Result Value Ref Range   Glucose-Capillary 243 (H) 70 - 99 mg/dL   Comment 1 Notify RN    Comment 2 Document in Chart   Glucose, capillary  Result Value Ref Range   Glucose-Capillary 185 (H) 70 - 99 mg/dL   Comment 1 Notify RN    Comment 2 Document in Chart     DG Cervical Spine 1 View  Result Date: 04/05/2022 CLINICAL DATA:  Fluoroscopy assistance for cervical spine surgery EXAM: DG CERVICAL SPINE - 1 VIEW COMPARISON:  MR cervical spine done on 03/01/2022 FINDINGS: Fluoroscopic image shows interval anterior surgical fusion at C3-C4 level. Fluoroscopic time was 11 seconds.  Radiation dose 1.95 mGy. IMPRESSION: Fluoroscopic assistance was provided for anterior fusion at C3-C4 level. Electronically Signed   By: Elmer Picker M.D.   On: 04/05/2022 10:18   DG C-Arm 1-60 Min-No Report  Result Date: 04/05/2022 Fluoroscopy was utilized by the requesting physician.  No radiographic interpretation.   DG C-Arm 1-60 Min-No Report  Result Date: 04/05/2022 Fluoroscopy was utilized by the requesting physician.  No radiographic interpretation.    Antibiotics:  Anti-infectives (From admission, onward)    Start     Dose/Rate Route Frequency Ordered Stop   04/05/22 1600  ceFAZolin (ANCEF) IVPB 2g/100 mL premix        2 g 200 mL/hr over 30 Minutes Intravenous Every 8 hours 04/05/22 1150 04/05/22 2348   04/05/22 0600  ceFAZolin (ANCEF) IVPB 2g/100 mL premix        2 g 200 mL/hr over 30 Minutes Intravenous On call to O.R. 04/05/22 0544 04/05/22 2505       Discharge Exam: Blood pressure 132/74, pulse 89, temperature 97.7 F (36.5 C), temperature source Oral, resp. rate 18, height '5\' 5"'$  (1.651 m), weight 81.2 kg, SpO2 93 %. Neurologic: Grossly normal Ambulating and voiding wel incision cdi   Discharge Medications:   Allergies as of 04/06/2022       Reactions   Nickel Swelling   Sulfa Antibiotics Hives   Clarithromycin Rash   Codeine Itching, Other (See Comments)   Headaches   Lamotrigine Rash   Tape Itching        Medication List  TAKE these medications    albuterol 108 (90 Base) MCG/ACT inhaler Commonly known as: VENTOLIN HFA Inhale 2 puffs into the lungs every 6 (six) hours as needed for wheezing or shortness of breath.   aspirin EC 81 MG tablet Take 1 tablet (81 mg total) by mouth daily. Swallow whole.   atorvastatin 40 MG tablet Commonly known as: LIPITOR Take 1 tablet (40 mg total) by mouth daily.   Blue-Emu Hemp 10 % cream Generic drug: trolamine salicylate Apply 1 Application topically as needed for muscle pain.   buprenorphine 8  MG Subl SL tablet Commonly known as: SUBUTEX Place 8 mg under the tongue 2 (two) times daily.   cetirizine 10 MG tablet Commonly known as: ZYRTEC Take 10 mg by mouth daily as needed for allergies.   clotrimazole-betamethasone cream Commonly known as: LOTRISONE Apply to affected area 2 times daily until symptoms improve.   cyclobenzaprine 10 MG tablet Commonly known as: FLEXERIL Take 10 mg by mouth at bedtime as needed for muscle spasms.   DULoxetine 30 MG capsule Commonly known as: CYMBALTA Take 30 mg by mouth daily.   enalapril 10 MG tablet Commonly known as: VASOTEC Take 10 mg by mouth daily.   fluticasone 50 MCG/ACT nasal spray Commonly known as: FLONASE Place 1-2 sprays into both nostrils daily as needed for allergies or rhinitis.   gabapentin 600 MG tablet Commonly known as: NEURONTIN Take 600 mg by mouth 4 (four) times daily.   guaifenesin 400 MG Tabs tablet Commonly known as: HUMIBID E Take 400 mg by mouth in the morning and at bedtime.   Hair/Skin/Nails Caps Take 2 capsules by mouth daily.   ibuprofen 800 MG tablet Commonly known as: ADVIL Take 1 tablet (800 mg total) by mouth every 8 (eight) hours as needed. What changed: when to take this   levETIRAcetam 750 MG tablet Commonly known as: KEPPRA Take 1 tablet (750 mg total) by mouth 2 (two) times daily. What changed: when to take this   linaclotide 145 MCG Caps capsule Commonly known as: LINZESS Take 145 mcg by mouth daily before breakfast.   methocarbamol 750 MG tablet Commonly known as: Robaxin-750 Take 1 tablet (750 mg total) by mouth 4 (four) times daily.   metoprolol tartrate 50 MG tablet Commonly known as: LOPRESSOR Take 50 mg by mouth 2 (two) times daily.   montelukast 10 MG tablet Commonly known as: SINGULAIR Take 10 mg by mouth at bedtime.   multivitamin with minerals Tabs tablet Take 1 tablet by mouth daily.   naloxone 2 MG/2ML injection Commonly known as: NARCAN Place 1 mg into  the nose once.   nystatin powder Commonly known as: MYCOSTATIN/NYSTOP Apply 1 Application topically 3 (three) times daily. Apply 3 times daily until symptoms improve.   octreotide 10 MG injection Commonly known as: SANDOSTATIN LAR Inject 10 mg into the muscle every 28 (twenty-eight) days.   ondansetron 4 MG tablet Commonly known as: ZOFRAN Take 4 mg by mouth every 8 (eight) hours as needed for nausea or vomiting.   oxyCODONE-acetaminophen 5-325 MG tablet Commonly known as: PERCOCET/ROXICET Take 1 tablet by mouth every 8 (eight) hours as needed for moderate pain. What changed: Another medication with the same name was added. Make sure you understand how and when to take each.   oxyCODONE-acetaminophen 5-325 MG tablet Commonly known as: Percocet Take 1 tablet by mouth every 4 (four) hours as needed for severe pain. What changed: You were already taking a medication with the same name, and  this prescription was added. Make sure you understand how and when to take each.   pantoprazole 20 MG tablet Commonly known as: PROTONIX Take 20 mg by mouth daily.   polyethylene glycol powder 17 GM/SCOOP powder Commonly known as: GLYCOLAX/MIRALAX Take 17 g by mouth daily as needed for mild constipation or moderate constipation.   VITAMIN D3 PO Take 1 tablet by mouth daily.               Durable Medical Equipment  (From admission, onward)           Start     Ordered   04/05/22 1151  DME Walker rolling  Once       Question Answer Comment  Walker: With [place]   Patient needs a walker to treat with the following condition Gait disturbance      04/05/22 1150            Disposition: home   Final Dx: acdf C3-4  Discharge Instructions     Call MD for:  difficulty breathing, headache or visual disturbances   Complete by: As directed    Call MD for:  hives   Complete by: As directed    Call MD for:  persistant dizziness or light-headedness   Complete by: As  directed    Call MD for:  persistant nausea and vomiting   Complete by: As directed    Call MD for:  redness, tenderness, or signs of infection (pain, swelling, redness, odor or green/yellow discharge around incision site)   Complete by: As directed    Call MD for:  severe uncontrolled pain   Complete by: As directed    Call MD for:  temperature >100.4   Complete by: As directed    Diet - low sodium heart healthy   Complete by: As directed    Driving Restrictions   Complete by: As directed    No driving for 2 weeks, no riding in the car for 1 week   Increase activity slowly   Complete by: As directed    Lifting restrictions   Complete by: As directed    No lifting more than 8 lbs   No wound care   Complete by: As directed           Signed: Ocie Cornfield Jatavious Peppard 04/06/2022, 8:06 AM

## 2022-04-20 DIAGNOSIS — D3A029 Benign carcinoid tumor of the large intestine, unspecified portion: Secondary | ICD-10-CM | POA: Diagnosis not present

## 2022-05-05 ENCOUNTER — Encounter: Payer: Self-pay | Admitting: Gastroenterology

## 2022-05-19 ENCOUNTER — Ambulatory Visit: Payer: Medicare PPO | Admitting: Gastroenterology

## 2022-05-31 ENCOUNTER — Ambulatory Visit: Payer: Medicare PPO | Admitting: Gastroenterology

## 2022-05-31 NOTE — Progress Notes (Deleted)
GI Office Note    Referring Provider: Celene Squibb, MD Primary Care Physician:  Celene Squibb, MD  Primary Gastroenterologist: Garfield Cornea, MD   Chief Complaint   No chief complaint on file.    History of Present Illness   Pamela Hatfield is a 71 y.o. female presenting today to schedule colonoscopy.  Attempted colonoscopy however this summer unsuccessful due to poor bowel prep.  She was last seen in the office back in April.  Patient's father had colon cancer at an unknown age.  She has several second-degree relatives with history of colon cancer.  Daughter had adenomatous colon polyps in her 8s with possibly carcinoma in situ undergoing EMR observation.  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  Tissue sampling has not been successful, balloon enteroscopy to try to obtain tissue from the ileal primary lesion in January 2015 cannot be completed, unable to deeply intubate the TI.PET dotatate showing lymph nodes were dotatate avid increasing probability of this being a carcinoid tumor.  Periaortic tumor burden was not felt to be resectable at the time of initial presentation due to centrally located nodal disease at the origin of the SMA.  Most recent imaging in January 2023 suggested ileal carcinoid with regional adenopathy.  Felt to have a low volume disease and has been observed, mild progression of disease noted.  She has been receiving monthly octreotide injections. She has had some new orthopnea and DOE.  Cardiology evaluation suggested.  Short interval follow-up with Dr. Bailey Mech to discuss potential resection of her primary tumor/small bowel resection.  Also followed by hematology/oncology, Dr. Karle Starch. Last OV 05/18/22 with stable imaging, continues octreotide every four weeks.   She has history of elevated alkaline phosphatase.   She has history of pancreatic lesion seen in  2014 on CT and MRI. Has been evaluated. Per oncology, "This was not PET-avid on PET done on 03/23/2013; followed by MRI on 06/05/13 and was found to be a splenule. CT on 03/25/15 did not show any pancreatic lesions. CT scans since then has showed that the pancreas is WNL without lesion. PET Dotatate showed an avid lesion over the tail of the pancreas which may be secondary to spleen. 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 We will continue to watch on all imaging"  She has history of submucosal gastric lesion, see no EUS 2013, followed on EUS 2014 and in 2018 with negative biopsies, per oncology considering repeat EUS this year.   Since her last office visit she did see cardiology, completed CT coronary and echo in May 2023 with no significant findings. She also had neck surgery in July.   Colonoscopy March 24, 2022, exam aborted due to poor prep.   Previous work up:    Colonoscopy October 2013 normal.  Repeat colonoscopy recommended in 5 years due to family history. EGD October 2013, endoscopically normal-appearing esophagus status post dilation for history of dysphagia, small hiatal hernia, submucosal gastric mass versus extrinsic mass, mild erythema of the stomach lining.  Consider EUS.   EUS completed October 2013: Biopsies unremarkable.  Recommended repeat EUS in 12 months. EUS completed November 2014 at [facility]: Gastric subepithelial lesion noted, pathology revealed benign gastric mucosa with reactive gastropathy and focal intestinal metaplasia. According to hematology records she had an EUS with biopsy  March 2018 with no evidence of malignancy as well, plans for repeat in 2023.   Double Balloon Enteroscopy 09/2013: IMPRESSIONS:     1.  Normal colon, ileoceal valve and terminal ileum.  2.  Deep intubation of the terminal ileum was not possible.  RECOMMENDATIONS:     Follow-up with Dr. Nelda Marseille.  I think  a surgical option  should be re-considered.   PATHOLOGY:     Pathology - No Specimens Obtained       CT Chest/Abd/Pelvis: 1.  Dominant central mesenteric nodule/mass, stable compared with 02/15/22 CT.  2.  Left kidney lesions are stable across multiple prior CTs.  3.  Pancreas: Mild atrophy of the pancreatic head. Isoattenuating lesions in the pancreatic tail measuring 18 to 21 mm, similar to 02/15/2022 CT and stable dating back to at least 04/19/2013 CT, likely representative of intrapancreatic splenic tissue 4. Subcentimeter bilateral hypodense lesions are too small to characterize. Left lower pole calcified lesion is similar. Left upper pole 15 mm lesion with high-density is similar, likely a hemorrhagic cyst and stable dating back to 06/22/2016 CT. Left interpolar partially exophytic 14 mm lesion with intermediate-density is similar and stable dating back to 06/22/2016 CT.   CT abdomen pelvis with contrast January 2023 for history of malignant carcinoid tumor CONCLUSION:  1.  Overall similar size and appearance of mesenteric nodal mass.  2.  Hyperenhancing lesion of the proximal ileum with focal wall thickening likely represents the primary tumor, further described above.  3.  Slightly increased size of left lower pole renal cystic lesion, measuring 1.6 x 1.4 cm (series 2, image 119), previously 1.5 x 1.2 cm in 2018. Recommend attention on follow-up imaging.  4.  Ancillary findings as above. Exam End: 10/06/21 10:35    PET tumor imaging January 2022:  PET findings are reflective of interval disease progression as demonstrated by slight increased size of bowel and mesenteric deposits with stability of the larger mesenteric mass as detailed above. No new tracer avid metastatic lesions evident.  2.   No definite abnormal focal uptake in body of pancreas in current study.  3.  Additional CT findings as above.     Medications   Current Outpatient Medications  Medication Sig Dispense Refill    albuterol (VENTOLIN HFA) 108 (90 Base) MCG/ACT inhaler Inhale 2 puffs into the lungs every 6 (six) hours as needed for wheezing or shortness of breath.      aspirin EC 81 MG tablet Take 1 tablet (81 mg total) by mouth daily. Swallow whole. 90 tablet 3   atorvastatin (LIPITOR) 40 MG tablet Take 1 tablet (40 mg total) by mouth daily. 90 tablet 3   buprenorphine (SUBUTEX) 8 MG SUBL SL tablet Place 8 mg under the tongue 2 (two) times daily.     cetirizine (ZYRTEC) 10 MG tablet Take 10 mg by mouth daily as needed for allergies.      Cholecalciferol (VITAMIN D3 PO) Take 1 tablet by mouth daily.     clotrimazole-betamethasone (LOTRISONE) cream Apply to affected area 2 times daily until symptoms improve. 45 g 0   cyclobenzaprine (FLEXERIL) 10 MG tablet Take 10 mg by mouth at bedtime as needed for muscle spasms.     DULoxetine (CYMBALTA) 30 MG capsule Take 30 mg by mouth daily.     enalapril (VASOTEC) 10 MG tablet Take 10 mg by mouth daily.      fluticasone (FLONASE) 50 MCG/ACT nasal spray Place 1-2 sprays into both nostrils daily as needed for allergies  or rhinitis.     gabapentin (NEURONTIN) 600 MG tablet Take 600 mg by mouth 4 (four) times daily.     guaifenesin (HUMIBID E) 400 MG TABS tablet Take 400 mg by mouth in the morning and at bedtime.     ibuprofen (ADVIL) 800 MG tablet Take 1 tablet (800 mg total) by mouth every 8 (eight) hours as needed. (Patient taking differently: Take 800 mg by mouth as needed.) 90 tablet 1   levETIRAcetam (KEPPRA) 750 MG tablet Take 1 tablet (750 mg total) by mouth 2 (two) times daily. (Patient taking differently: Take 750 mg by mouth daily.) 120 tablet 11   linaclotide (LINZESS) 145 MCG CAPS capsule Take 145 mcg by mouth daily before breakfast.     methocarbamol (ROBAXIN-750) 750 MG tablet Take 1 tablet (750 mg total) by mouth 4 (four) times daily. 45 tablet 0   metoprolol (LOPRESSOR) 50 MG tablet Take 50 mg by mouth 2 (two) times daily.     montelukast (SINGULAIR) 10  MG tablet Take 10 mg by mouth at bedtime.      Multiple Vitamin (MULTIVITAMIN WITH MINERALS) TABS tablet Take 1 tablet by mouth daily.     Multiple Vitamins-Minerals (HAIR/SKIN/NAILS) CAPS Take 2 capsules by mouth daily.     naloxone (NARCAN) 2 MG/2ML injection Place 1 mg into the nose once.     nystatin (MYCOSTATIN/NYSTOP) powder Apply 1 Application topically 3 (three) times daily. Apply 3 times daily until symptoms improve. 60 g 0   octreotide (SANDOSTATIN LAR) 10 MG injection Inject 10 mg into the muscle every 28 (twenty-eight) days.     ondansetron (ZOFRAN) 4 MG tablet Take 4 mg by mouth every 8 (eight) hours as needed for nausea or vomiting.     oxyCODONE-acetaminophen (PERCOCET) 5-325 MG tablet Take 1 tablet by mouth every 4 (four) hours as needed for severe pain. 20 tablet 0   oxyCODONE-acetaminophen (PERCOCET/ROXICET) 5-325 MG tablet Take 1 tablet by mouth every 8 (eight) hours as needed for moderate pain.     pantoprazole (PROTONIX) 20 MG tablet Take 20 mg by mouth daily.     polyethylene glycol powder (GLYCOLAX/MIRALAX) 17 GM/SCOOP powder Take 17 g by mouth daily as needed for mild constipation or moderate constipation.     trolamine salicylate (BLUE-EMU HEMP) 10 % cream Apply 1 Application topically as needed for muscle pain.     No current facility-administered medications for this visit.    Allergies   Allergies as of 05/31/2022 - Review Complete 04/05/2022  Allergen Reaction Noted   Nickel Swelling 01/03/2018   Sulfa antibiotics Hives 03/31/2011   Clarithromycin Rash 03/31/2011   Codeine Itching and Other (See Comments) 03/31/2011   Lamotrigine Rash 01/23/2020   Tape Itching 06/01/2011    Past Medical History   Past Medical History:  Diagnosis Date   Accessory spleen    Arthritis    multiple areas of her body   Asthma    Cancer (HCC)    located on her aorta and small intestines- on Octreodtide 10mg  IM every 28 days for this   Depression    Diabetes mellitus     diet controlled   Dyspnea    today 11/09/2017, reports that her breathing is normal for her    Dysrhythmia    hx rapid heart beat   Fibromyalgia    GERD (gastroesophageal reflux disease)    History of hiatal hernia    Hyperlipidemia    Hypertension    Myalgia and myositis, unspecified  Polyneuropathy    Sleep apnea    no cpap used, pt does not like, use to use CPAP but due to insurance had to return the device    Weight loss 02/25/2011    Past Surgical History   Past Surgical History:  Procedure Laterality Date   ABDOMINAL HYSTERECTOMY  yrs ago   ovaries remain, done because of endometriosis   ANTERIOR CERVICAL DECOMP/DISCECTOMY FUSION N/A 11/16/2017   Procedure: Anterior Cervical Decompression/Discectomy Fusion - Cervical four-Cervical five - Cervical five-Cervical six - Cervical six-Cervical seven;  Surgeon: Eustace Moore, MD;  Location: [place] [facility];  Service: Neurosurgery;  Laterality: N/A;   ANTERIOR CERVICAL DECOMP/DISCECTOMY FUSION N/A 04/05/2022   Procedure: Anterior Cervical Discectomy and Fusion Cervical Three-Four, Removal of Plate Cervical Four-Five;  Surgeon: Eustace Moore, MD;  Location: [place];  Service: Neurosurgery;  Laterality: N/A;  3C   BACK SURGERY  yrs ago   lower back   BREAST BIOPSY Bilateral    benign biopsies   BREAST LUMPECTOMY     both breast   CARDIAC CATHETERIZATION     CARPAL TUNNEL RELEASE Right 2003   CARPAL TUNNEL RELEASE Left 02/12/2020   Procedure: CARPAL TUNNEL RELEASE;  Surgeon: Carole Civil, MD;  Location: [facility];  Service: Orthopedics;  Laterality: Left;   CHOLECYSTECTOMY  yrs ago   COLONOSCOPY  06/2012   normal   ESOPHAGEAL DILATION  06/14/2012   Procedure: ESOPHAGEAL DILATION;  Surgeon: Daneil Dolin, MD;  Location: [facility];  Service: Endoscopy;;   EUS  07/13/2012   Procedure: UPPER ENDOSCOPIC ULTRASOUND (EUS) LINEAR;  Surgeon: Milus Banister, MD;  Location: [facility];  Service: Endoscopy;  Laterality: N/A;   FLEXIBLE  SIGMOIDOSCOPY N/A 03/24/2022   Procedure: FLEXIBLE SIGMOIDOSCOPY;  Surgeon: Daneil Dolin, MD;  Location: [facility];  Service: Endoscopy;  Laterality: N/A;   NOSE SURGERY  yrs ago   ROTATOR CUFF REPAIR  yrs ago   left    Past Family History   Family History  Problem Relation Age of Onset   Cancer Mother        died age 18, ?not sure what kind, metastatic at time of diagnosis   Colon cancer Father    Colon cancer Maternal Aunt    Colon cancer Maternal Uncle     Past Social History   Social History   Socioeconomic History   Marital status: Divorced    Spouse name: Not on file   Number of children: 1   Years of education: Some college   Highest education level: Some college, no degree  Occupational History   Occupation: Retired  Tobacco Use   Smoking status: Former    Packs/day: 0.50    Years: 20.00    Total pack years: 10.00    Types: Cigarettes    Quit date: 09/13/1992    Years since quitting: 29.7   Smokeless tobacco: Never   Tobacco comments:    quit about 25 + years ago  Vaping Use   Vaping Use: Never used  Substance and Sexual Activity   Alcohol use: No   Drug use: No   Sexual activity: Yes  Other Topics Concern   Not on file  Social History Narrative   Lives alone   Caffeine use: rare   Right handed    Social Determinants of Health   Financial Resource Strain: Not on file  Food Insecurity: Not on file  Transportation Needs: Not on file  Physical Activity: Not on  file  Stress: Not on file  Social Connections: Not on file  Intimate Partner Violence: Not on file    Review of Systems   General: Negative for anorexia, weight loss, fever, chills, fatigue, weakness. Eyes: Negative for vision changes.  ENT: Negative for hoarseness, difficulty swallowing , nasal congestion. CV: Negative for chest pain, angina, palpitations, dyspnea on exertion, peripheral edema.  Respiratory: Negative for dyspnea at rest, dyspnea on exertion, cough, sputum,  wheezing.  GI: See history of present illness. GU:  Negative for dysuria, hematuria, urinary incontinence, urinary frequency, nocturnal urination.  MS: Negative for joint pain, low back pain.  Derm: Negative for rash or itching.  Neuro: Negative for weakness, abnormal sensation, seizure, frequent headaches, memory loss,  confusion.  Psych: Negative for anxiety, depression, suicidal ideation, hallucinations.  Endo: Negative for unusual weight change.  Heme: Negative for bruising or bleeding. Allergy: Negative for rash or hives.  Physical Exam   There were no vitals taken for this visit.   General: Well-nourished, well-developed in no acute distress.  Head: Normocephalic, atraumatic.   Eyes: Conjunctiva pink, no icterus. Mouth: Oropharyngeal mucosa moist and pink , no lesions erythema or exudate. Neck: Supple without thyromegaly, masses, or lymphadenopathy.  Lungs: Clear to auscultation bilaterally.  Heart: Regular rate and rhythm, no murmurs rubs or gallops.  Abdomen: Bowel sounds are normal, nontender, nondistended, no hepatosplenomegaly or masses,  no abdominal bruits or hernia, no rebound or guarding.   Rectal: not performed Extremities: No lower extremity edema. No clubbing or deformities.  Neuro: Alert and oriented x 4 , grossly normal neurologically.  Skin: Warm and dry, no rash or jaundice.   Psych: Alert and cooperative, normal mood and affect.  Labs   Labs from 05/18/2022: White blood cell count 11,300, hemoglobin 10.9, hematocrit 33, MCV 86.3, platelets 301,000, sodium 135, glucose 140, creatinine 0.88, albumin 3.1, alk phos 149, total bilirubin 0.3, AST 22, ALT 14.  Labs from April 20, 2022: White blood cell count 12,100, hemoglobin 11.8, alk phos 201  Labs from February 23, 2022: White blood cell count 11,800, hemoglobin 11.9, alkaline phosphatase 163. Imaging Studies   No results found.  Assessment       PLAN   AMA, GGT   Laureen Ochs. Bobby Rumpf, [place],  [facility]

## 2022-06-01 ENCOUNTER — Encounter: Payer: Self-pay | Admitting: Gastroenterology

## 2022-06-01 ENCOUNTER — Telehealth: Payer: Self-pay | Admitting: Gastroenterology

## 2022-06-01 ENCOUNTER — Ambulatory Visit (INDEPENDENT_AMBULATORY_CARE_PROVIDER_SITE_OTHER): Payer: Medicare HMO | Admitting: Gastroenterology

## 2022-06-01 VITALS — BP 126/78 | HR 71 | Temp 96.8°F | Ht 65.0 in | Wt 176.2 lb

## 2022-06-01 DIAGNOSIS — C7A012 Malignant carcinoid tumor of the ileum: Secondary | ICD-10-CM | POA: Diagnosis not present

## 2022-06-01 DIAGNOSIS — K5900 Constipation, unspecified: Secondary | ICD-10-CM

## 2022-06-01 DIAGNOSIS — Z800 Family history of malignant neoplasm of digestive organs: Secondary | ICD-10-CM | POA: Diagnosis not present

## 2022-06-01 DIAGNOSIS — R748 Abnormal levels of other serum enzymes: Secondary | ICD-10-CM | POA: Diagnosis not present

## 2022-06-01 NOTE — Progress Notes (Signed)
GI Office Note    Referring Provider: Celene Squibb, MD Primary Care Physician:  Celene Squibb, MD  Primary Gastroenterologist: Garfield Cornea, MD   Chief Complaint   Chief Complaint  Patient presents with   Colonoscopy     History of Present Illness   Pamela Hatfield is a 71 y.o. female presenting today to schedule colonoscopy.  Attempted colonoscopy earlier this summer unsuccessful due to poor bowel prep.  She was last seen in the office back in April. Patient's father had colon cancer at an unknown age.  She has several second-degree relatives with history of colon cancer.  Daughter had adenomatous colon polyps in her 12s with possibly carcinoma in situ undergoing EMR observation.   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  Tissue sampling has not been successful, balloon enteroscopy to try to obtain tissue from the ileal primary lesion in January 2015 could not be completed, unable to deeply intubate the TI.PET dotatate showing lymph nodes were dotatate avid increasing probability of this being a carcinoid tumor.  Periaortic tumor burden was not felt to be resectable at the time of initial presentation due to centrally located nodal disease at the origin of the SMA.  Most recent imaging in January 2023 suggested ileal carcinoid with regional adenopathy.  Felt to have a low volume disease and has been observed, mild progression of disease noted.  She has been receiving monthly octreotide injections. She has had some new orthopnea and DOE.  Cardiology evaluation suggested.  Short interval follow-up with Dr. Bailey Mech to discuss potential resection of her primary tumor/small bowel resection.  Also followed by hematology/oncology, Dr. Karle Starch. Last OV 05/18/22 with stable imaging, continues octreotide every four weeks.    She has history of elevated alkaline phosphatase. 05/18/2022:  141 04/20/2022: 201 12/29/2021: 164  Alkaline phosphatase isoenzymes March 2023: Alkaline phosphatase 190, liver fraction 60%, bone fraction 36%, intestine fraction 4%    Since her last office visit she did see cardiology, completed CT coronary and echo in May 2023 with no significant findings. She also had neck surgery in July.   Colonoscopy March 24, 2022, exam aborted due to poor prep.  Today: patient states she was having better stools on Linzess 176mg daily when she was provided samples. She did not tolerate 295m due to fecal incontinence. Her PCP had been trying to get patient assistance but per patient, the company has never gotten back forms from PCP. She has completed her part of paper work. She also takes miralax at times to help with BM. She passes small amount of small stool several times in a day on days she goes. Never feels like she gets finished. Does not feel like she is getting relief. During recent bowel prep, she thought she was cleaned well. She describes passing watery stool. She received split clenpiq prep with second dose the morning of her colonoscopy. She states she was not having good bowel movements the week prior to her bowel prep.  She had been out of Linzess.  She was using MiraLAX only.  Previously did not tolerate gallon preps, vomits halfway through.  Denies any melena or rectal bleeding.  She really has had no abdominal pain.  Feels like her system is just slow, in the setting of hydrocodone, octreotide.   CT Chest/Abd/Pelvis May 18, 2022: 1.  Dominant central mesenteric nodule/mass, stable compared with 02/15/22 CT.  2.  Left kidney lesions are stable across multiple prior CTs.  3.  Pancreas: Mild atrophy of the pancreatic head. Isoattenuating lesions in the pancreatic tail measuring 18 to 21 mm, similar to 02/15/2022 CT and stable dating back to at least 04/19/2013 CT, likely representative of intrapancreatic splenic tissue 4. Subcentimeter bilateral hypodense  lesions are too small to characterize. Left lower pole calcified lesion is similar. Left upper pole 15 mm lesion with high-density is similar, likely a hemorrhagic cyst and stable dating back to 06/22/2016 CT. Left interpolar partially exophytic 14 mm lesion with intermediate-density is similar and stable dating back to 06/22/2016 CT.      Previous work up:   She has history of pancreatic lesion seen in 2014 on CT and MRI. Has been evaluated. Per oncology, "This was not PET-avid on PET done on 03/23/2013; followed by MRI on 06/05/13 and was found to be a splenule. CT on 03/25/15 did not show any pancreatic lesions. CT scans since then has showed that the pancreas is WNL without lesion. PET Dotatate showed an avid lesion over the tail of the pancreas which may be secondary to spleen. 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 We will continue to watch on all imaging"   She has history of submucosal gastric lesion, EUS completed October 2013: Biopsies unremarkable. Recommended repeat EUS in 12 months. EUS completed November 2014 at [facility]: Gastric subepithelial lesion noted, pathology revealed benign gastric mucosa with reactive gastropathy and focal intestinal metaplasia. According to hematology records she had an EUS with biopsy March 2018 with no evidence of malignancy as well, plans for repeat in 2023.    Colonoscopy October 2013 normal.  Repeat colonoscopy recommended in 5 years due to family history.  EGD October 2013, endoscopically normal-appearing esophagus status post dilation for history of dysphagia, small hiatal hernia, submucosal gastric mass versus extrinsic mass, mild erythema of the stomach lining.  Consider EUS.    Double Balloon Enteroscopy 09/2013: IMPRESSIONS:     1.  Normal colon, ileoceal valve and terminal ileum.  2.  Deep intubation of the terminal ileum was not possible.  RECOMMENDATIONS:      Follow-up with Dr. Nelda Marseille.  I think a surgical option  should be re-considered.   PATHOLOGY:     Pathology - No Specimens Obtained      CT abdomen pelvis with contrast January 2023 for history of malignant carcinoid tumor CONCLUSION:  1.  Overall similar size and appearance of mesenteric nodal mass.  2.  Hyperenhancing lesion of the proximal ileum with focal wall thickening likely represents the primary tumor, further described above.  3.  Slightly increased size of left lower pole renal cystic lesion, measuring 1.6 x 1.4 cm (series 2, image 119), previously 1.5 x 1.2 cm in 2018. Recommend attention on follow-up imaging.  4.  Ancillary findings as above. Exam End: 10/06/21 10:35    PET tumor imaging January 2022:  PET findings are reflective of interval disease progression as demonstrated by slight increased size of bowel and mesenteric deposits with stability of the larger mesenteric mass as detailed above. No new tracer avid metastatic lesions evident.  2.   No definite abnormal focal uptake in body of pancreas in current study.  3.  Additional CT findings as above.   Medications   Current Outpatient Medications  Medication Sig Dispense Refill   albuterol (VENTOLIN HFA) 108 (90 Base) MCG/ACT inhaler Inhale  2 puffs into the lungs every 6 (six) hours as needed for wheezing or shortness of breath.      cetirizine (ZYRTEC) 10 MG tablet Take 10 mg by mouth daily as needed for allergies.      Cholecalciferol (VITAMIN D3 PO) Take 1 tablet by mouth daily.     clotrimazole-betamethasone (LOTRISONE) cream Apply to affected area 2 times daily until symptoms improve. 45 g 0   DULoxetine (CYMBALTA) 30 MG capsule Take 30 mg by mouth daily.     enalapril (VASOTEC) 10 MG tablet Take 10 mg by mouth daily.      fluticasone (FLONASE) 50 MCG/ACT nasal spray Place 1-2 sprays into both nostrils daily as needed for allergies or rhinitis.     gabapentin (NEURONTIN) 600 MG tablet Take 600 mg by mouth 4  (four) times daily.     HYDROcodone-acetaminophen (NORCO) 7.5-325 MG tablet Take 1 tablet by mouth every 8 (eight) hours as needed.     linaclotide (LINZESS) 145 MCG CAPS capsule Take 145 mcg by mouth daily before breakfast.     methocarbamol (ROBAXIN-750) 750 MG tablet Take 1 tablet (750 mg total) by mouth 4 (four) times daily. 45 tablet 0   metoprolol (LOPRESSOR) 50 MG tablet Take 50 mg by mouth 2 (two) times daily.     montelukast (SINGULAIR) 10 MG tablet Take 10 mg by mouth at bedtime.      Multiple Vitamin (MULTIVITAMIN WITH MINERALS) TABS tablet Take 1 tablet by mouth daily.     Multiple Vitamins-Minerals (HAIR/SKIN/NAILS) CAPS Take 2 capsules by mouth daily.     naloxone (NARCAN) 2 MG/2ML injection Place 1 mg into the nose once.     nystatin (MYCOSTATIN/NYSTOP) powder Apply 1 Application topically 3 (three) times daily. Apply 3 times daily until symptoms improve. 60 g 0   octreotide (SANDOSTATIN LAR) 10 MG injection Inject 10 mg into the muscle every 28 (twenty-eight) days.     pantoprazole (PROTONIX) 20 MG tablet Take 20 mg by mouth daily.     polyethylene glycol powder (GLYCOLAX/MIRALAX) 17 GM/SCOOP powder Take 17 g by mouth daily as needed for mild constipation or moderate constipation.     trolamine salicylate (BLUE-EMU HEMP) 10 % cream Apply 1 Application topically as needed for muscle pain.     No current facility-administered medications for this visit.    Allergies   Allergies as of 06/01/2022 - Review Complete 06/01/2022  Allergen Reaction Noted   Nickel Swelling 01/03/2018   Sulfa antibiotics Hives 03/31/2011   Clarithromycin Rash 03/31/2011   Codeine Itching and Other (See Comments) 03/31/2011   Lamotrigine Rash 01/23/2020   Tape Itching 06/01/2011    Past Medical History   Past Medical History:  Diagnosis Date   Accessory spleen    Arthritis    multiple areas of her body   Asthma    Cancer ([place])    located on her aorta and small intestines- on Octreodtide  57m IM every 28 days for this   Depression    Diabetes mellitus    diet controlled   Dyspnea    today 11/09/2017, reports that her breathing is normal for her    Dysrhythmia    hx rapid heart beat   Fibromyalgia    GERD (gastroesophageal reflux disease)    History of hiatal hernia    Hyperlipidemia    Hypertension    Myalgia and myositis, unspecified    Polyneuropathy    Sleep apnea    no cpap used, pt does not  like, use to use CPAP but due to insurance had to return the device    Weight loss 02/25/2011    Past Surgical History   Past Surgical History:  Procedure Laterality Date   ABDOMINAL HYSTERECTOMY  yrs ago   ovaries remain, done because of endometriosis   ANTERIOR CERVICAL DECOMP/DISCECTOMY FUSION N/A 11/16/2017   Procedure: Anterior Cervical Decompression/Discectomy Fusion - Cervical four-Cervical five - Cervical five-Cervical six - Cervical six-Cervical seven;  Surgeon: Eustace Moore, MD;  Location: [place];  Service: Neurosurgery;  Laterality: N/A;   ANTERIOR CERVICAL DECOMP/DISCECTOMY FUSION N/A 04/05/2022   Procedure: Anterior Cervical Discectomy and Fusion Cervical Three-Four, Removal of Plate Cervical Four-Five;  Surgeon: Eustace Moore, MD;  Location: [place];  Service: Neurosurgery;  Laterality: N/A;  3C   BACK SURGERY  yrs ago   lower back   BREAST BIOPSY Bilateral    benign biopsies   BREAST LUMPECTOMY     both breast   CARDIAC CATHETERIZATION     CARPAL TUNNEL RELEASE Right 2003   CARPAL TUNNEL RELEASE Left 02/12/2020   Procedure: CARPAL TUNNEL RELEASE;  Surgeon: Carole Civil, MD;  Location: [facility];  Service: Orthopedics;  Laterality: Left;   CHOLECYSTECTOMY  yrs ago   COLONOSCOPY  06/2012   normal   ESOPHAGEAL DILATION  06/14/2012   Procedure: ESOPHAGEAL DILATION;  Surgeon: Daneil Dolin, MD;  Location: [facility];  Service: Endoscopy;;   EUS  07/13/2012   Procedure: UPPER ENDOSCOPIC ULTRASOUND (EUS) LINEAR;  Surgeon: Milus Banister, MD;   Location: [facility];  Service: Endoscopy;  Laterality: N/A;   FLEXIBLE SIGMOIDOSCOPY N/A 03/24/2022   Procedure: FLEXIBLE SIGMOIDOSCOPY;  Surgeon: Daneil Dolin, MD;  Location: [facility];  Service: Endoscopy;  Laterality: N/A;   NOSE SURGERY  yrs ago   ROTATOR CUFF REPAIR  yrs ago   left    Past Family History   Family History  Problem Relation Age of Onset   Cancer Mother        died age 17, ?not sure what kind, metastatic at time of diagnosis   Colon cancer Father    Colon cancer Maternal Aunt    Colon cancer Maternal Uncle     Past Social History   Social History   Socioeconomic History   Marital status: Divorced    Spouse name: Not on file   Number of children: 1   Years of education: Some college   Highest education level: Some college, no degree  Occupational History   Occupation: Retired  Tobacco Use   Smoking status: Former    Packs/day: 0.50    Years: 20.00    Total pack years: 10.00    Types: Cigarettes    Quit date: 09/13/1992    Years since quitting: 29.7   Smokeless tobacco: Never   Tobacco comments:    quit about 25 + years ago  Vaping Use   Vaping Use: Never used  Substance and Sexual Activity   Alcohol use: No   Drug use: No   Sexual activity: Yes  Other Topics Concern   Not on file  Social History Narrative   Lives alone   Caffeine use: rare   Right handed    Social Determinants of Health   Financial Resource Strain: Not on file  Food Insecurity: Not on file  Transportation Needs: Not on file  Physical Activity: Not on file  Stress: Not on file  Social Connections: Not on file  Intimate Partner  Violence: Not on file    Review of Systems   General: Negative for anorexia, weight loss, fever, chills, fatigue, weakness. Eyes: Negative for vision changes.  ENT: Negative for hoarseness, difficulty swallowing , nasal congestion. CV: Negative for chest pain, angina, palpitations, dyspnea on exertion, peripheral edema.   Respiratory: Negative for dyspnea at rest, dyspnea on exertion, cough, sputum, wheezing.  GI: See history of present illness. GU:  Negative for dysuria, hematuria, urinary incontinence, urinary frequency, nocturnal urination.  MS: Negative for joint pain, low back pain.  Derm: Negative for rash or itching.  Neuro: Negative for weakness, abnormal sensation, seizure, frequent headaches, memory loss,  confusion.  Psych: Negative for anxiety, depression, suicidal ideation, hallucinations.  Endo: Negative for unusual weight change.  Heme: Negative for bruising or bleeding. Allergy: Negative for rash or hives.  Physical Exam   BP 126/78 (BP Location: Left Arm, Patient Position: Sitting, Cuff Size: Normal)   Pulse 71   Temp (!) 96.8 F (36 C) (Temporal)   Ht 5' 5"  (1.651 m)   Wt 176 lb 3.2 oz (79.9 kg)   SpO2 93%   BMI 29.32 kg/m    General: Well-nourished, well-developed in no acute distress.  Head: Normocephalic, atraumatic.   Eyes: Conjunctiva pink, no icterus. Mouth: Oropharyngeal mucosa moist and pink , no lesions erythema or exudate. Neck: Supple without thyromegaly, masses, or lymphadenopathy.  Lungs: Clear to auscultation bilaterally.  Heart: Regular rate and rhythm, no murmurs rubs or gallops.  Abdomen: Bowel sounds are normal, nontender, nondistended, no hepatosplenomegaly or masses,  no abdominal bruits or hernia, no rebound or guarding.   Rectal: Not performed Extremities: No lower extremity edema. No clubbing or deformities.  Neuro: Alert and oriented x 4 , grossly normal neurologically.  Skin: Warm and dry, no rash or jaundice.   Psych: Alert and cooperative, normal mood and affect.  Labs   See HPI     Imaging Studies   No results found.  Assessment   Due for high risk screening colonoscopy: Family history of colon cancer as outlined previously.  Last colonoscopy more than 5 years ago.  Recent attempt for colonoscopy with poor bowel prep.  At baseline has  chronic constipation.  Also likely exacerbated in the setting of narcotics and octreotide.  She had been doing well on Linzess 145 mcg daily but ran out of medication pending patient assistance approval and was not on medication week before procedure.  We will plan 2 days of clear liquids, split prep all the day before her procedure, Linzess 145 mcg daily along with MiraLAX half to 1 capful daily, stressed importance of her taking these medications the week prior to her procedure.  She has intolerance to large volume preps due to vomiting.  She was noted to have sharp angulation of the sigmoid colon on last attempted colonoscopy.  Plan to use pediatric scope. Although likely not to be successful, would consider attempting intubation of TI to try to obtain tissue for diagnosis, see below.  History of suspected malignant ileal carcinoid tumor: followed by oncology and Dr. Carlis Abbott at [facility]. On Octreotide monthly since 2014.  There was some consideration of possible surgical intervention by Dr. Carlis Abbott pending cardiology evaluation.  Previously unable to obtain tissue for diagnosis, terminal ileum could not be deeply intubated at time of double-balloon enteroscopy in 2015.  Chronically elevated alkaline phosphatase: Isoenzymes unremarkable.  Consider GGT, AMA.  PLAN   Resume Linzess 145 mcg daily.  Samples provided.  We will reach out to  patient assistance. Continue MiraLAX to half to 1 capful daily if needed to maintain regular soft stool. Patient is to call if her bowel movements do not improve significantly, goal of regular soft stool 4 to 5 days/week.  She is to call if she runs out of Linzess. Plan for colonoscopy with Dr. Gala Romney.  ASA 3.  Plan to use pediatric scope, attempt intubation of terminal ileum.2 full days of clear liquids split prep the day before procedure.  Linzess 145 mcg daily at minimum starting at least 7 days prior to colonoscopy.  Continue MiraLAX 1/2-1 capful daily until prep starts.  I  have discussed the risks, alternatives, benefits with regards to but not limited to the risk of reaction to medication, bleeding, infection, perforation and the patient is agreeable to proceed. Written consent to be obtained. Will request labs to be done for chronically elevated alk phos.   Laureen Ochs. Bobby Rumpf, [place], [facility]

## 2022-06-01 NOTE — Patient Instructions (Signed)
Resume Linzess 136mg daily. We will contact drug company about patient assistance. Continue miralax 1/2 to 1 cup daily if needed to have regular soft stool. Please call if you run out of Linzess or you are not moving your bowels regularly. Colonoscopy to be scheduled.

## 2022-06-01 NOTE — Telephone Encounter (Signed)
Please let pt know, at her earliest convenience, I would like for her to go for a couple of liver labs. She can do this whenever, I have placed orders for [facility]. This will be to look at chronically elevated alk phos.    Also, please contact patient assistance for Linzess. She started paper work with Dr. Nevada Crane but says that the company says they have not received his information twice when requested. She has been out of [place] for awhile now and had failed bowel prep for colonoscopy in July. She needs linzess 153mg daily.

## 2022-06-02 NOTE — Telephone Encounter (Signed)
Lmom for pt to return my call.  

## 2022-06-07 NOTE — Telephone Encounter (Signed)
Noted  

## 2022-06-07 NOTE — Telephone Encounter (Signed)
Pt was made aware and verbalized understanding. Advised pt to bring income verification to the office for me to submit for patient assistance for Linzess.

## 2022-06-08 NOTE — Telephone Encounter (Signed)
Pt stopped by and signed the paperwork for patient assistance on Linzess 145, as well as dropped off needed financial information. Form is in your box to be signed.

## 2022-06-10 LAB — MITOCHONDRIAL ANTIBODIES: Mitochondrial Ab: <20 Units (ref 0.0–20.0)

## 2022-06-10 LAB — GAMMA GT: GGT: 32 IU/L (ref 0–60)

## 2022-06-14 NOTE — Telephone Encounter (Signed)
Yes, form has been faxed to [facility] as well. Waiting on approval/denial decision.

## 2022-06-14 NOTE — Telephone Encounter (Signed)
I must have signed and sent back to you?

## 2022-06-16 ENCOUNTER — Telehealth: Payer: Self-pay | Admitting: *Deleted

## 2022-06-16 ENCOUNTER — Encounter: Payer: Self-pay | Admitting: *Deleted

## 2022-06-16 NOTE — Telephone Encounter (Signed)
PA approved via [employer]here. Auth# [id number], DOS: 07/15/2022 - 10/13/2022

## 2022-06-16 NOTE — Telephone Encounter (Signed)
Called pt. Scheduled for TCS with Dr. Gala Romney, ASA 3 on 11/2 at [place] of instructions. Linzess samples left for pick up with instructions/pre-op appt.

## 2022-07-13 ENCOUNTER — Encounter (HOSPITAL_COMMUNITY): Admission: RE | Admit: 2022-07-13 | Payer: Medicare HMO | Source: Ambulatory Visit

## 2022-07-13 ENCOUNTER — Telehealth: Payer: Self-pay | Admitting: *Deleted

## 2022-07-13 NOTE — Telephone Encounter (Signed)
Pt called and said she needs to reschedule her colonoscopy. Advised pt that Dr. Gala Romney doesn't have anything for this month, but once we get his December schedule we can give her a call or I could put her with Dr.Carver. She wanted to wait for December with Dr.Rourk.

## 2022-07-15 ENCOUNTER — Encounter (HOSPITAL_COMMUNITY): Admission: RE | Payer: Self-pay | Source: Home / Self Care

## 2022-07-15 ENCOUNTER — Ambulatory Visit (HOSPITAL_COMMUNITY): Admission: RE | Admit: 2022-07-15 | Payer: Medicare HMO | Source: Home / Self Care | Admitting: Internal Medicine

## 2022-07-15 SURGERY — COLONOSCOPY WITH PROPOFOL
Anesthesia: Monitor Anesthesia Care

## 2022-07-16 ENCOUNTER — Encounter: Payer: Self-pay | Admitting: *Deleted

## 2022-07-16 NOTE — Progress Notes (Signed)
Cardiology Office Note:    Date:  11/24/2022   ID:  Pamela Hatfield, DOB [birth year], MRN [id number]  PCP:  Berneta Levins, DO   [place] Cardiologist:  Lenna Sciara, MD Referring MD: Celene Squibb, MD   Chief Complaint/Reason for Referral: Chest pain and shortness of breath  PATIENT DID NOT APPEAR FOR APPOINTMENT   ASSESSMENT:    1. Mild CAD   2. Type 2 diabetes mellitus without complication, without long-term current use of insulin ([place])   3. Hypertension associated with diabetes ([place])   4. Hyperlipidemia associated with type 2 diabetes mellitus ([place])   5. Aortic atherosclerosis (H[facility])   6. Carcinoid tumor, unspecified site, unspecified whether malignant      PLAN:    In order of problems listed above: 1.  Coronary artery disease: Continue aspirin, strict blood pressure control, and well likely refer to pharmacy for hyperlipidemia therapy given intolerance of statins. 2.  Type 2 diabetes: Continue aspirin 81 mg, enalapril; start Jardiance 10 mg daily; will refer to pharmacy for lipid lowering therapy. 3.  Hypertension: Continue enalapril with goal blood pressure less than 130/80 mmHg. 4.  Hyperlipidemia: Given that she is a diabetic her goal LDL is less than 70.  We will start atorvastatin and check a panel and LFTs in 2 months.  Check lipid panel and LP(a) today.  If above goal of 70 will refer to pharmacy for further evaluation. 5.  Aortic atherosclerosis: Continue aspirin and statin therapy as detailed above. 6.  Carcinoid tumor: This is being managed by other providers.     History of Present Illness:    FOCUSED PROBLEM LIST:   1.  Type 2 diabetes, diet controlled 3.  Hyperlipidemia; intolerant of simvastatin and atorvastatin 4.  Fibromyalgia 5.  GERD with hiatal hernia 6.  OSA, intolerant of CPAP 7.  Carcinoid tumor of small intestine followed by otology oncology [facility]; on octreotide 8.  Mild coronary artery disease on coronary CTA  2023 9.  Aortic atherosclerosis on coronary CTA 2023  May 2023 consultation: The patient is a 71 y.o. female with the indicated medical history here for recommendations regarding chest pain and dyspnea.  The patient has had 3-4 episodes of chest pain over the last 12 months.  She tells me that they sometimes occur at rest and sometimes occur with exertion but not every time she exerts herself.  She describes a sharp chest pain that radiates to her neck bilaterally.  She does have chronic shortness of breath that has been relatively unchanged over the past several years.  She denies any presyncope or syncope.  She has had no severe lower extremity edema but has noted redness and swelling of her knees bilaterally when she gets up in the morning.  They are associated with stiffness and she thinks she might have arthritis of some sort.  She has required no emergency room visits or hospitalizations.  She tells me that she had been on simvastatin in the past but could not tolerate it due to myalgias and arthralgias.  She is currently not on statin medication.  Plan: Obtain coronary CTA and echocardiogram, start aspirin 81 mg and atorvastatin 40 mg; consider SGLT2 inhibitor in the future.  Today: In the interim the patient had a coronary CTA which showed only mild obstructive coronary artery disease and echocardiogram that was reassuring.  She underwent cervical spine surgery without complications.  She was unable to tolerate her atorvastatin and is off all lipid therapy for  now.  Current Medications: No outpatient medications have been marked as taking for the 07/21/22 encounter (Office Visit) with Early Osmond, MD.     Allergies:    Nickel, Sulfa antibiotics, Clarithromycin, Codeine, Lamotrigine, and Tape   Social History:   Social History   Tobacco Use   Smoking status: Former    Packs/day: 0.50    Years: 20.00    Total pack years: 10.00    Types: Cigarettes    Quit date: 09/13/1992    Years  since quitting: 30.2   Smokeless tobacco: Never   Tobacco comments:    quit about 25 + years ago  Vaping Use   Vaping Use: Never used  Substance Use Topics   Alcohol use: No   Drug use: No     Family Hx: Family History  Problem Relation Age of Onset   Cancer Mother        died age 44, ?not sure what kind, metastatic at time of diagnosis   Colon cancer Father    Colon cancer Maternal Aunt    Colon cancer Maternal Uncle      Review of Systems:   Please see the history of present illness.    All other systems reviewed and are negative.     EKGs/Labs/Other Test Reviewed:    EKG:  EKG performed May 2021 that I personally reviewed demonstrates sinus rhythm with possible inferior infarction pattern; EKG performed today that I personally reviewed demonstrates sinus rhythm with nonspecific ST and T wave changes.  Prior CV studies:  2023 TTE:  1. Left ventricular ejection fraction, by estimation, is 60 to 65%. The  left ventricle has normal function. The left ventricle has no regional  wall motion abnormalities. Left ventricular diastolic parameters are  consistent with Grade I diastolic  dysfunction (impaired relaxation).   2. Right ventricular systolic function is normal. The right ventricular  size is normal. Tricuspid regurgitation signal is inadequate for assessing  PA pressure.   3. The mitral valve is normal in structure. No evidence of mitral valve  regurgitation. No evidence of mitral stenosis.   4. The aortic valve was not well visualized. Aortic valve regurgitation  is trivial. No aortic stenosis is present.   5. The inferior vena cava is normal in size with greater than 50%  respiratory variability, suggesting right atrial pressure of 3 mmHg.   2023 coronary CTA: 1. Coronary calcium score of 10 this was 41st percentile for age, sex, and race matched control. 2. Normal coronary origin with left dominance. 3. Dilation of the main pulmonary artery, moderate, 32 mm.  This can be associated with the presence of pulmonary hypertension; clinical correlation advised. 4. Aortic atherosclerosis. 5. CAD-RADS 1. Minimal non-obstructive CAD (1-24%). Consider non-atherosclerotic causes of chest pain. Consider preventive therapy and risk factor modification.  2017 monitor: Normal sinus rhythm  2010 Coronary angiography without obstructive disease  Other studies Reviewed: Review of the additional studies/records demonstrates: Prior CT imaging without aortic atherosclerosis  Recent Labs: 03/31/2022: BUN 7; Creatinine, Ser 1.00; Hemoglobin 11.5; Platelets 342; Potassium 4.3; Sodium 139   Recent Lipid Panel No results found for: "CHOL", "TRIG", "HDL", "[place]", "LDLDIRECT"  Risk Assessment/Calculations:           Physical Exam:      Signed, Early Osmond, MD  11/24/2022 2:30 PM    [facility] [address], [place], [place]  [address] Phone: [phone]; Fax: [phone]   Note:  This document was  prepared using Systems analyst and may include unintentional dictation errors.

## 2022-07-16 NOTE — Telephone Encounter (Signed)
Called pt. Rescheduled to 12/6 at [place] will mail new instructions/pre-op appt. She reports pharmacy has her prep and will get from them  PA approved via cohere. Auth# [id number], DOS: 07/15/2022 - 10/13/2022

## 2022-07-21 ENCOUNTER — Ambulatory Visit: Payer: Medicare PPO | Admitting: Internal Medicine

## 2022-07-21 DIAGNOSIS — I2510 Atherosclerotic heart disease of native coronary artery without angina pectoris: Secondary | ICD-10-CM

## 2022-07-21 DIAGNOSIS — E1169 Type 2 diabetes mellitus with other specified complication: Secondary | ICD-10-CM

## 2022-07-21 DIAGNOSIS — I700 Atherosclerosis of aorta: Secondary | ICD-10-CM

## 2022-07-21 DIAGNOSIS — I152 Hypertension secondary to endocrine disorders: Secondary | ICD-10-CM

## 2022-07-21 DIAGNOSIS — D3A00 Benign carcinoid tumor of unspecified site: Secondary | ICD-10-CM

## 2022-07-21 DIAGNOSIS — E119 Type 2 diabetes mellitus without complications: Secondary | ICD-10-CM

## 2022-08-12 ENCOUNTER — Other Ambulatory Visit: Payer: Self-pay | Admitting: *Deleted

## 2022-08-12 ENCOUNTER — Encounter (HOSPITAL_COMMUNITY): Payer: Self-pay

## 2022-08-12 ENCOUNTER — Encounter (HOSPITAL_COMMUNITY)
Admission: RE | Admit: 2022-08-12 | Discharge: 2022-08-12 | Disposition: A | Discharge status: Home / Self Care | Payer: Medicare HMO | Source: Ambulatory Visit | Attending: Internal Medicine | Admitting: Internal Medicine

## 2022-08-12 HISTORY — DX: Anxiety disorder, unspecified: F41.9

## 2022-08-12 MED ORDER — CLENPIQ 10-3.5-12 MG-GM -GM/175ML PO SOLN: 1.0000 | ORAL | 0 refills | Status: AC

## 2022-08-12 NOTE — Pre-Procedure Instructions (Signed)
Attempted pre-op phonecall. Left VM for her to call us back. 

## 2022-08-18 ENCOUNTER — Encounter (HOSPITAL_COMMUNITY)
Admission: RE | Disposition: A | Discharge status: Home / Self Care | Payer: Self-pay | Source: Home / Self Care | Attending: Internal Medicine

## 2022-08-18 ENCOUNTER — Ambulatory Visit (HOSPITAL_BASED_OUTPATIENT_CLINIC_OR_DEPARTMENT_OTHER): Payer: Medicare HMO | Admitting: Anesthesiology

## 2022-08-18 ENCOUNTER — Ambulatory Visit (HOSPITAL_COMMUNITY)
Admission: RE | Admit: 2022-08-18 | Discharge: 2022-08-18 | Disposition: A | Discharge status: Home / Self Care | Payer: Medicare HMO | Attending: Internal Medicine | Admitting: Internal Medicine

## 2022-08-18 ENCOUNTER — Ambulatory Visit (HOSPITAL_COMMUNITY): Payer: Medicare HMO | Admitting: Anesthesiology

## 2022-08-18 ENCOUNTER — Encounter (HOSPITAL_COMMUNITY): Payer: Self-pay | Admitting: Internal Medicine

## 2022-08-18 DIAGNOSIS — F32A Depression, unspecified: Secondary | ICD-10-CM | POA: Insufficient documentation

## 2022-08-18 DIAGNOSIS — K6389 Other specified diseases of intestine: Secondary | ICD-10-CM

## 2022-08-18 DIAGNOSIS — Z800 Family history of malignant neoplasm of digestive organs: Secondary | ICD-10-CM | POA: Insufficient documentation

## 2022-08-18 DIAGNOSIS — J45909 Unspecified asthma, uncomplicated: Secondary | ICD-10-CM | POA: Insufficient documentation

## 2022-08-18 DIAGNOSIS — Q438 Other specified congenital malformations of intestine: Secondary | ICD-10-CM

## 2022-08-18 DIAGNOSIS — F419 Anxiety disorder, unspecified: Secondary | ICD-10-CM | POA: Insufficient documentation

## 2022-08-18 DIAGNOSIS — R531 Weakness: Secondary | ICD-10-CM | POA: Insufficient documentation

## 2022-08-18 DIAGNOSIS — G709 Myoneural disorder, unspecified: Secondary | ICD-10-CM | POA: Diagnosis not present

## 2022-08-18 DIAGNOSIS — I10 Essential (primary) hypertension: Secondary | ICD-10-CM | POA: Diagnosis not present

## 2022-08-18 DIAGNOSIS — E785 Hyperlipidemia, unspecified: Secondary | ICD-10-CM | POA: Diagnosis not present

## 2022-08-18 DIAGNOSIS — Z1211 Encounter for screening for malignant neoplasm of colon: Secondary | ICD-10-CM | POA: Insufficient documentation

## 2022-08-18 DIAGNOSIS — Z1212 Encounter for screening for malignant neoplasm of rectum: Secondary | ICD-10-CM | POA: Diagnosis not present

## 2022-08-18 DIAGNOSIS — E119 Type 2 diabetes mellitus without complications: Secondary | ICD-10-CM | POA: Insufficient documentation

## 2022-08-18 DIAGNOSIS — Z87891 Personal history of nicotine dependence: Secondary | ICD-10-CM | POA: Insufficient documentation

## 2022-08-18 DIAGNOSIS — M1990 Unspecified osteoarthritis, unspecified site: Secondary | ICD-10-CM | POA: Diagnosis not present

## 2022-08-18 DIAGNOSIS — M797 Fibromyalgia: Secondary | ICD-10-CM | POA: Insufficient documentation

## 2022-08-18 DIAGNOSIS — Z7984 Long term (current) use of oral hypoglycemic drugs: Secondary | ICD-10-CM | POA: Insufficient documentation

## 2022-08-18 DIAGNOSIS — K219 Gastro-esophageal reflux disease without esophagitis: Secondary | ICD-10-CM | POA: Insufficient documentation

## 2022-08-18 DIAGNOSIS — K635 Polyp of colon: Secondary | ICD-10-CM | POA: Diagnosis not present

## 2022-08-18 DIAGNOSIS — G4730 Sleep apnea, unspecified: Secondary | ICD-10-CM | POA: Diagnosis not present

## 2022-08-18 HISTORY — PX: BIOPSY: SHX5522

## 2022-08-18 HISTORY — PX: COLONOSCOPY WITH PROPOFOL: SHX5780

## 2022-08-18 LAB — GLUCOSE, CAPILLARY: Glucose-Capillary: 164 mg/dL — ABNORMAL HIGH (ref 70–99)

## 2022-08-18 SURGERY — COLONOSCOPY WITH PROPOFOL
Anesthesia: General

## 2022-08-18 MED ORDER — FENTANYL CITRATE (PF) 100 MCG/2ML IJ SOLN
INTRAMUSCULAR | Status: DC | PRN
  Administered 2022-08-18 (×2): 50 ug via INTRAVENOUS

## 2022-08-18 MED ORDER — LACTATED RINGERS IV SOLN: INTRAVENOUS | Status: DC

## 2022-08-18 MED ORDER — FENTANYL CITRATE (PF) 100 MCG/2ML IJ SOLN
INTRAMUSCULAR | Status: AC
  Filled 2022-08-18: qty 2

## 2022-08-18 MED ORDER — PROPOFOL 500 MG/50ML IV EMUL
INTRAVENOUS | Status: DC | PRN
  Administered 2022-08-18: 150 ug/kg/min via INTRAVENOUS

## 2022-08-18 MED ORDER — PROPOFOL 10 MG/ML IV BOLUS
INTRAVENOUS | Status: DC | PRN
  Administered 2022-08-18: 100 mg via INTRAVENOUS

## 2022-08-18 NOTE — Discharge Instructions (Signed)
  Colonoscopy Discharge Instructions  Read the instructions outlined below and refer to this sheet in the next few weeks. These discharge instructions provide you with general information on caring for yourself after you leave the hospital. Your doctor may also give you specific instructions. While your treatment has been planned according to the most current medical practices available, unavoidable complications occasionally occur. If you have any problems or questions after discharge, call Dr. Gala Romney at [phone]. ACTIVITY You may resume your regular activity, but move at a slower pace for the next 24 hours.  Take frequent rest periods for the next 24 hours.  Walking will help get rid of the air and reduce the bloated feeling in your belly (abdomen).  No driving for 24 hours (because of the medicine (anesthesia) used during the test).   Do not sign any important legal documents or operate any machinery for 24 hours (because of the anesthesia used during the test).  NUTRITION Drink plenty of fluids.  You may resume your normal diet as instructed by your doctor.  Begin with a light meal and progress to your normal diet. Heavy or fried foods are harder to digest and may make you feel sick to your stomach (nauseated).  Avoid alcoholic beverages for 24 hours or as instructed.  MEDICATIONS You may resume your normal medications unless your doctor tells you otherwise.  WHAT YOU CAN EXPECT TODAY Some feelings of bloating in the abdomen.  Passage of more gas than usual.  Spotting of blood in your stool or on the toilet paper.  IF YOU HAD POLYPS REMOVED DURING THE COLONOSCOPY: No aspirin products for 7 days or as instructed.  No alcohol for 7 days or as instructed.  Eat a soft diet for the next 24 hours.  FINDING OUT THE RESULTS OF YOUR TEST Not all test results are available during your visit. If your test results are not back during the visit, make an appointment with your caregiver to find out the  results. Do not assume everything is normal if you have not heard from your caregiver or the medical facility. It is important for you to follow up on all of your test results.  SEEK IMMEDIATE MEDICAL ATTENTION IF: You have more than a spotting of blood in your stool.  Your belly is swollen (abdominal distention).  You are nauseated or vomiting.  You have a temperature over 101.  You have abdominal pain or discomfort that is severe or gets worse throughout the day.       No tumor found in your colon.  I was unable to look at your small intestine  Your colon did appear to be inflamed.  Biopsies were taken.  Further recommendations to follow pending review of pathology report   at patient request, I called Ferne Reus at [phone]

## 2022-08-18 NOTE — Anesthesia Postprocedure Evaluation (Signed)
Anesthesia Post Note  Patient: Pamela Hatfield  Procedure(s) Performed: COLONOSCOPY WITH PROPOFOL BIOPSY  Patient location during evaluation: Phase II Anesthesia Type: General Level of consciousness: awake and alert and oriented Pain management: pain level controlled Vital Signs Assessment: post-procedure vital signs reviewed and stable Respiratory status: spontaneous breathing, nonlabored ventilation and respiratory function stable Cardiovascular status: blood pressure returned to baseline and stable Postop Assessment: no apparent nausea or vomiting Anesthetic complications: no  No notable events documented.   Last Vitals:  Vitals:   08/18/22 0846 08/18/22 1054  BP: (!) 157/68 (!) 118/95  Pulse: 82 84  Resp: 20 14  Temp: 36.6 C 36.6 C  SpO2: 97% 95%    Last Pain:  Vitals:   08/18/22 1054  TempSrc: Oral  PainSc: 2                  Vasiliki Smaldone C Arles Rumbold

## 2022-08-18 NOTE — Transfer of Care (Signed)
Immediate Anesthesia Transfer of Care Note  Patient: Pamela Hatfield  Procedure(s) Performed: COLONOSCOPY WITH PROPOFOL BIOPSY  Patient Location: Short Stay  Anesthesia Type:General  Level of Consciousness: awake, alert , oriented, and patient cooperative  Airway & Oxygen Therapy: Patient Spontanous Breathing  Post-op Assessment: Report given to RN, Post -op Vital signs reviewed and stable, and Patient moving all extremities X 4  Post vital signs: Reviewed and stable  Last Vitals:  Vitals Value Taken Time  BP 118/95 08/18/22 1054  Temp 36.6 C 08/18/22 1054  Pulse 84 08/18/22 1054  Resp 14 08/18/22 1054  SpO2 95 % 08/18/22 1054    Last Pain:  Vitals:   08/18/22 1054  TempSrc: Oral  PainSc: 2       Patients Stated Pain Goal: 6 (34/75/83 0746)  Complications: No notable events documented.

## 2022-08-18 NOTE — Anesthesia Preprocedure Evaluation (Addendum)
Anesthesia Evaluation  Patient identified by MRN, date of birth, ID band Patient awake    Reviewed: Allergy & Precautions, H&P , NPO status , Patient's Chart, lab work & pertinent test results  Airway Mallampati: III  TM Distance: >3 FB Neck ROM: Limited   Comment: ACDF Dental no notable dental hx. (+) Dental Advisory Given, Teeth Intact   Pulmonary shortness of breath and with exertion, asthma , sleep apnea , former smoker   Pulmonary exam normal breath sounds clear to auscultation       Cardiovascular Exercise Tolerance: Poor METS (bilateral LE weakness): hypertension, Pt. on medications Normal cardiovascular exam+ dysrhythmias  Rhythm:Regular Rate:Normal  1. Left ventricular ejection fraction, by estimation, is 60 to 65%. The  left ventricle has normal function. The left ventricle has no regional  wall motion abnormalities. Left ventricular diastolic parameters are  consistent with Grade I diastolic  dysfunction (impaired relaxation).   2. Right ventricular systolic function is normal. The right ventricular  size is normal. Tricuspid regurgitation signal is inadequate for assessing  PA pressure.   3. The mitral valve is normal in structure. No evidence of mitral valve  regurgitation. No evidence of mitral stenosis.   4. The aortic valve was not well visualized. Aortic valve regurgitation  is trivial. No aortic stenosis is present.   5. The inferior vena cava is normal in size with greater than 50%  respiratory variability, suggesting right atrial pressure of 3 mmHg.     Neuro/Psych  PSYCHIATRIC DISORDERS Anxiety Depression     Neuromuscular disease    GI/Hepatic Neg liver ROS, hiatal hernia, PUD,GERD  Medicated, Controlled and Poorly Controlled,,  Endo/Other  diabetes, Well Controlled, Type 2, Oral Hypoglycemic Agents    Renal/GU negative Renal ROS  negative genitourinary   Musculoskeletal  (+) Arthritis ,  Osteoarthritis,  Fibromyalgia -  Abdominal   Peds negative pediatric ROS (+)  Hematology negative hematology ROS (+)   Anesthesia Other Findings Ileal carcinoid   Reproductive/Obstetrics negative OB ROS                             Anesthesia Physical Anesthesia Plan  ASA: 3  Anesthesia Plan: General   Post-op Pain Management: Minimal or no pain anticipated   Induction: Intravenous  PONV Risk Score and Plan: Propofol infusion  Airway Management Planned: Nasal Cannula and Natural Airway  Additional Equipment:   Intra-op Plan:   Post-operative Plan:   Informed Consent: I have reviewed the patients History and Physical, chart, labs and discussed the procedure including the risks, benefits and alternatives for the proposed anesthesia with the patient or authorized representative who has indicated his/her understanding and acceptance.     Dental advisory given  Plan Discussed with: CRNA and Surgeon  Anesthesia Plan Comments:        Anesthesia Quick Evaluation

## 2022-08-18 NOTE — H&P (Signed)
_0 @   Primary Care Physician:  Celene Squibb, MD Primary Gastroenterologist:  Dr.   Pre-Procedure History & Physical: HPI:  Pamela Hatfield is a 71 y.o. female here for  high risk screening colonoscopy.  History of colon cancer in first-degree and multiple second-degree relatives.  Also history of probable metastatic carcinoid arising from the terminal ileum.  No tissue diagnosis as of yet.  She is here for high risk screening.  Will attempt to intubate the terminal ileum and identified the tumor for tissue sampling.    This maneuver was previously  attempted but failed at [facility].  Past Medical History:  Diagnosis Date   Accessory spleen    Anxiety    Arthritis    multiple areas of her body   Asthma    Cancer ([place])    located on her aorta and small intestines- on Octreodtide 50m IM every 28 days for this   Depression    Diabetes mellitus    diet controlled   Dyspnea    today 11/09/2017, reports that her breathing is normal for her    Dysrhythmia    hx rapid heart beat   Fibromyalgia    GERD (gastroesophageal reflux disease)    History of hiatal hernia    Hyperlipidemia    Hypertension    Myalgia and myositis, unspecified    Polyneuropathy    Sleep apnea    no cpap used, pt does not like, use to use CPAP but due to insurance had to return the device    Weight loss 02/25/2011    Past Surgical History:  Procedure Laterality Date   ABDOMINAL HYSTERECTOMY  yrs ago   ovaries remain, done because of endometriosis   ANTERIOR CERVICAL DECOMP/DISCECTOMY FUSION N/A 11/16/2017   Procedure: Anterior Cervical Decompression/Discectomy Fusion - Cervical four-Cervical five - Cervical five-Cervical six - Cervical six-Cervical seven;  Surgeon: JEustace Moore MD;  [facility]  Service: Neurosurgery;  Laterality: N/A;   ANTERIOR CERVICAL DECOMP/DISCECTOMY FUSION N/A 04/05/2022   Procedure: Anterior Cervical Discectomy and Fusion Cervical Three-Four, Removal  of Plate Cervical Four-Five;  Surgeon: JEustace Moore MD;  [facility]  Service: Neurosurgery;  Laterality: N/A;  3C   BACK SURGERY  yrs ago   lower back   BREAST BIOPSY Bilateral    benign biopsies   BREAST LUMPECTOMY     both breast   CARDIAC CATHETERIZATION     CARPAL TUNNEL RELEASE Right 2003   CARPAL TUNNEL RELEASE Left 02/12/2020   Procedure: CARPAL TUNNEL RELEASE;  Surgeon: HCarole Civil MD;  Location: [facility];  Service: Orthopedics;  Laterality: Left;   CHOLECYSTECTOMY  yrs ago   COLONOSCOPY  06/2012   normal   ESOPHAGEAL DILATION  06/14/2012   Procedure: ESOPHAGEAL DILATION;  Surgeon: RDaneil Dolin MD;  [facility];  Service: Endoscopy;;   EUS  07/13/2012   Procedure: UPPER ENDOSCOPIC ULTRASOUND (EUS) LINEAR;  Surgeon: DMilus Banister MD;  [facility];  Service: Endoscopy;  Laterality: N/A;   FLEXIBLE SIGMOIDOSCOPY N/A 03/24/2022   Procedure: FLEXIBLE SIGMOIDOSCOPY;  Surgeon: RDaneil Dolin MD;  [facility];  Service: Endoscopy;  Laterality: N/A;   NOSE SURGERY  yrs ago   ROTATOR CUFF REPAIR  yrs ago   left    Prior to Admission medications   Medication Sig Start Date End Date Taking? Authorizing Provider  cetirizine (ZYRTEC) 10 MG tablet Take 10 mg by mouth daily as  needed for allergies.    Yes [provider]  Cholecalciferol (VITAMIN D3 PO) Take 1 tablet by mouth daily.   Yes [provider]  clotrimazole-betamethasone (LOTRISONE) cream Apply to affected area 2 times daily until symptoms improve. Patient taking differently: Apply 1 Application topically daily as needed (pain). 03/29/22  Yes Leath-Warren, Alda Lea, NP  DULoxetine (CYMBALTA) 60 MG capsule Take 60 mg by mouth daily. 12/02/21  Yes [provider]  enalapril (VASOTEC) 10 MG tablet Take 10 mg by mouth daily.    Yes [provider]  fluticasone (FLONASE) 50 MCG/ACT nasal spray Place 1-2 sprays into both nostrils daily as  needed for allergies or rhinitis.   Yes [provider]  gabapentin (NEURONTIN) 600 MG tablet Take 600 mg by mouth 3 (three) times daily. 04/10/20  Yes [provider]  levETIRAcetam (KEPPRA) 750 MG tablet Take 750 mg by mouth 2 (two) times daily. 07/15/22  Yes [provider]  linaclotide (LINZESS) 145 MCG CAPS capsule Take 145 mcg by mouth daily before breakfast.   Yes [provider]  methocarbamol (ROBAXIN-750) 750 MG tablet Take 1 tablet (750 mg total) by mouth 4 (four) times daily. Patient taking differently: Take 750 mg by mouth 3 (three) times daily. 04/06/22  Yes Meyran, Ocie Cornfield, NP  metoprolol (LOPRESSOR) 50 MG tablet Take 50 mg by mouth 2 (two) times daily.   Yes [provider]  montelukast (SINGULAIR) 10 MG tablet Take 10 mg by mouth at bedtime.    Yes [provider]  Multiple Vitamin (MULTIVITAMIN WITH MINERALS) TABS tablet Take 1 tablet by mouth daily.   Yes [provider]  Multiple Vitamins-Minerals (HAIR/SKIN/NAILS) CAPS Take 2 capsules by mouth daily.   Yes [provider]  NON FORMULARY Take 0.5 tablets by mouth daily as needed (pain). CBD Gummies   Yes [provider]  nystatin (MYCOSTATIN/NYSTOP) powder Apply 1 Application topically 3 (three) times daily. Apply 3 times daily until symptoms improve. 03/29/22  Yes Leath-Warren, Alda Lea, NP  pantoprazole (PROTONIX) 20 MG tablet Take 20 mg by mouth daily.   Yes [provider]  polyethylene glycol powder (GLYCOLAX/MIRALAX) 17 GM/SCOOP powder Take 17 g by mouth daily as needed for mild constipation or moderate constipation.   Yes [provider]  Sod Picosulfate-Mag Ox-Cit Acd (CLENPIQ) 10-3.5-12 MG-GM -GM/175ML SOLN Take 1 kit by mouth as directed. 08/12/22  Yes Fionnuala Hemmerich, Cristopher Estimable, MD  trolamine salicylate (BLUE-EMU HEMP) 10 % cream Apply 1 Application topically as needed for muscle pain.   Yes [provider]  albuterol  (VENTOLIN HFA) 108 (90 Base) MCG/ACT inhaler Inhale 2 puffs into the lungs every 6 (six) hours as needed for wheezing or shortness of breath.  01/08/20   [provider]  octreotide (SANDOSTATIN LAR) 10 MG injection Inject 10 mg into the muscle every 28 (twenty-eight) days.    [provider]    Allergies as of 07/16/2022 - Review Complete 07/08/2022  Allergen Reaction Noted   Nickel Swelling 01/03/2018   Sulfa antibiotics Hives 03/31/2011   Clarithromycin Rash 03/31/2011   Codeine Itching and Other (See Comments) 03/31/2011   Lamotrigine Rash 01/23/2020   Tape Itching 06/01/2011    Family History  Problem Relation Age of Onset   Cancer Mother        died age 31, ?not sure what kind, metastatic at time of diagnosis   Colon cancer Father    Colon cancer Maternal Aunt    Colon cancer Maternal  Uncle     Social History   Socioeconomic History   Marital status: Divorced    Spouse name: Not on file   Number of children: 1   Years of education: Some college   Highest education level: Some college, no degree  Occupational History   Occupation: Retired  Tobacco Use   Smoking status: Former    Packs/day: 0.50    Years: 20.00    Total pack years: 10.00    Types: Cigarettes    Quit date: 09/13/1992    Years since quitting: 29.9   Smokeless tobacco: Never   Tobacco comments:    quit about 25 + years ago  Vaping Use   Vaping Use: Never used  Substance and Sexual Activity   Alcohol use: No   Drug use: No   Sexual activity: Yes  Other Topics Concern   Not on file  Social History Narrative   Lives alone   Caffeine use: rare   Right handed    Social Determinants of Health   Financial Resource Strain: Not on file  Food Insecurity: Not on file  Transportation Needs: Not on file  Physical Activity: Not on file  Stress: Not on file  Social Connections: Not on file  Intimate Partner Violence: Not on file    Review of Systems: See HPI, otherwise negative  ROS  Physical Exam: BP (!) 157/68   Pulse 82   Temp 97.8 F (36.6 C) (Oral)   Resp 20   SpO2 97%  General:   Alert,  Well-developed, well-nourished, pleasant and cooperative in NAD Neck:  Supple; no masses or thyromegaly. No significant cervical adenopathy. Lungs:  Clear throughout to auscultation.   No wheezes, crackles, or rhonchi. No acute distress. Heart:  Regular rate and rhythm; no murmurs, clicks, rubs,  or gallops. Abdomen: Non-distended, normal bowel sounds.  Soft and nontender without appreciable mass or hepatosplenomegaly.  Pulses:  Normal pulses noted. Extremities:  Without clubbing or edema.  Impression/Plan:    71 year old lady with a marked positive family history of colon cancer and a history of probable metastatic carcinoid tumor here for high risk screening colonoscopy.  I have offered her an ileocolonoscopy.  Will attempt to see  surveyed the TI and perform tissue sampling of the lesion is present.  No guarantees.  The risks, benefits, limitations, alternatives and imponderables have been reviewed with the patient. Questions have been answered. All parties are agreeable.       Notice: This dictation was prepared with Dragon dictation along with smaller phrase technology. Any transcriptional errors that result from this process are unintentional and may not be corrected upon review.

## 2022-08-18 NOTE — Op Note (Signed)
[facility] Patient Name: Pamela Hatfield Procedure Date: 08/18/2022 9:59 AM MRN: [id number] Date of Birth: [birth year] Attending MD: Norvel Richards , MD, [id number] CSN: [id number] Age: 71 Admit Type: Outpatient Procedure:                Colonoscopy Indications:              Screening in patient at increased risk: Family                            history of 1st-degree relative with colorectal                            cancer Providers:                Norvel Richards, MD, Lambert Mody,                            Everardo Pacific Referring MD:              Medicines:                Propofol per Anesthesia Complications:            No immediate complications. Estimated Blood Loss:     Estimated blood loss was minimal. Procedure:                Pre-Anesthesia Assessment:                           - Prior to the procedure, a History and Physical                            was performed, and patient medications and                            allergies were reviewed. The patient's tolerance of                            previous anesthesia was also reviewed. The risks                            and benefits of the procedure and the sedation                            options and risks were discussed with the patient.                            All questions were answered, and informed consent                            was obtained. Prior Anticoagulants: The patient has                            taken no anticoagulant or antiplatelet agents. ASA                            Grade Assessment: III -  A patient with severe                            systemic disease. After reviewing the risks and                            benefits, the patient was deemed in satisfactory                            condition to undergo the procedure.                           After obtaining informed consent, the colonoscope                            was passed under direct vision. Throughout  the                            procedure, the patient's blood pressure, pulse, and                            oxygen saturations were monitored continuously. The                            [id number]-HQ190L ([id number]) scope was introduced through                            the anus and advanced to the the cecum, identified                            by appendiceal orifice and ileocecal valve. The                            ileocecal valve, appendiceal orifice, and rectum                            were photographed. The entire colon was well                            visualized. Scope In: 10:27:55 AM Scope Out: 10:49:03 AM Scope Withdrawal Time: 0 hours 11 minutes 24 seconds  Total Procedure Duration: 0 hours 21 minutes 8 seconds  Findings:      The perianal and digital rectal examinations were normal.      The colon (entire examined portion) was moderately redundant requiring       external abdominal pressure to reach the cecum. Colon had a diffuse       somewhat reticulated pattern. Please see photos. No polyp or neoplasm       seen. I attempted to intubate the terminal ileum. The orifice was small       and angulated. I was unable to intubate even with the pediatric       colonoscope. Rectal vault small; unable to retroflex. Mucosa seen well       on?face. Biopsy of the colonic mucosa was taken for histologic study Impression:               -  Redundant colon.                           - No specimens collected. Moderate Sedation:      Moderate (conscious) sedation was personally administered by an       anesthesia professional. The following parameters were monitored: oxygen       saturation, heart rate, blood pressure, respiratory rate, EKG, adequacy       of pulmonary ventilation, and response to care. Recommendation:           Screening to be determined pending review of                            pathology report.- Patient has a contact number                            available for  emergencies. The signs and symptoms                            of potential delayed complications were discussed                            with the patient. Return to normal activities                            tomorrow. Written discharge instructions were                            provided to the patient.                           - Advance diet as tolerated.                           - Continue present medications. Further                            recommendations to follow.                           - Procedure Code(s):        --- Professional ---                           [phone], Colonoscopy, flexible; diagnostic, including                            collection of specimen(s) by brushing or washing,                            when performed (separate procedure) Diagnosis Code(s):        --- Professional ---                           Z80.0, Family history of malignant neoplasm of  digestive organs                           Q43.8, Other specified congenital malformations of                            intestine CPT copyright 2022 American [employer] Association. All rights reserved. The codes documented in this report are preliminary and upon coder review may  be revised to meet current compliance requirements. Cristopher Estimable. Dajanae Brophy, MD Norvel Richards, MD 08/18/2022 11:02:04 AM This report has been signed electronically. Number of Addenda: 0

## 2022-08-20 ENCOUNTER — Telehealth: Payer: Self-pay

## 2022-08-20 ENCOUNTER — Encounter: Payer: Self-pay | Admitting: Internal Medicine

## 2022-08-20 LAB — SURGICAL PATHOLOGY

## 2022-08-20 NOTE — Telephone Encounter (Signed)
Patient Assistance for [employer] through [place] assist was approved through 09/13/2023. Approval documents to be scanned in the patients chart.

## 2022-08-25 ENCOUNTER — Encounter (HOSPITAL_COMMUNITY): Payer: Self-pay | Admitting: Internal Medicine

## 2022-08-26 ENCOUNTER — Ambulatory Visit: Payer: Medicare HMO | Admitting: Neurology

## 2022-08-26 ENCOUNTER — Encounter: Payer: Self-pay | Admitting: Neurology

## 2022-08-26 VITALS — BP 139/81 | HR 73 | Ht 60.0 in | Wt 173.0 lb

## 2022-08-26 DIAGNOSIS — R208 Other disturbances of skin sensation: Secondary | ICD-10-CM | POA: Diagnosis not present

## 2022-08-26 DIAGNOSIS — G629 Polyneuropathy, unspecified: Secondary | ICD-10-CM

## 2022-08-26 DIAGNOSIS — M4802 Spinal stenosis, cervical region: Secondary | ICD-10-CM

## 2022-08-26 DIAGNOSIS — G992 Myelopathy in diseases classified elsewhere: Secondary | ICD-10-CM

## 2022-08-26 DIAGNOSIS — G253 Myoclonus: Secondary | ICD-10-CM | POA: Diagnosis not present

## 2022-08-26 DIAGNOSIS — E1142 Type 2 diabetes mellitus with diabetic polyneuropathy: Secondary | ICD-10-CM

## 2022-08-26 NOTE — Progress Notes (Signed)
[facility]  PATIENT: Pamela Hatfield DOB: [birth year]  REFERRING DOCTOR OR PCP:  Celene Squibb, MD SOURCE: patient, notes form PCP, imaging reports.    MRI images personally reviewed.    _________________________________   HISTORICAL  CHIEF COMPLAINT:  Chief Complaint  Patient presents with   Follow-up    Pt in room #10 and alone. Pt here today to f/u with polyneuropathy.    HISTORY OF PRESENT ILLNESS:  Pamela Hatfield is a 71 yo woman with polyneuropathy and spasms  UPDATE 08/26/2022: After the last visit, we checked a cervical spine MRI showing severe spnal stenosis at C3C4.   She had surgery (Dr. Ronnald Ramp 04/05/2022.  The involuntary jerks in the body are mildly better since the surgery but not resolved.   Levetiracetam 750 mg po bid also helped some.   She reports gait is doing a little better.   She noted itching on her back and head after the surgery  Several medications were tried for dysesthesias.   She could not tolerate oxcarbazepine due to hyponatremia (was hospitalized).    Lamotrigine had not helped and caused a rash.    Gabapentin only helped a little bit even at 600 mg po qid   Higher dose of duloxetine has not helped.      Her worse pain is LB, neck and head.    She sees Pain Management and was started on Belbuca.  She has doet controlled DM.   Metformin was considered but due to GI issues from the carcinoid, she prefers not to start it.     Spine History She had ACDF C4-C7 11/16/2017.   The pre-surgical MRI 10/12/2017 showed C4C5 greater than  C5C6 and C6C7 spinal stenosis and disc protrusion at C4C5.       She also has lower back pain.   I reviewed her MRI images and she has mild spinal stenosis with lateral recess and foraminal narrowing at L3L4 and right lateral recess stenosis and milder foraminal narrowing at L4L5 that could affect the right L5 nerver root.     She sees Dr. Merlene Laughter for controlled substances.       MRI cervical spine 03/01/2022  showed severe stenosis at [facility] and she had cervical fusion 04/05/2022  STUDIES: MRI cervical 03/01/2022 IMPRESSION: Since the prior cervical spine MRI of 10/12/2017, there has been interval ACDF at the C4-C7 levels.   Cervical spondylosis, as outlined and most notably as follows.   At C3-C4, there is new moderate degenerative endplate edema. Progressive multifactorial severe spinal canal stenosis with spinal cord impingement. T2 hyperintense signal abnormality within the spinal cord at this level may reflect edema and/or myelomalacia. Progressive severe bilateral neural foraminal narrowing.   At C4-C5, there is a posterior osteophyte which focally effaces the ventral thecal sac, contacting and mildly flattening the ventral aspect of the spinal cord. However, the dorsal CSF space is maintained within the spinal canal.   At T1-T2, there is a new small cranially migrated central disc extrusion. The disc extrusion results in mild focal effacement of the ventral thecal sac (without spinal cord mass effect).   No more than mild spinal canal or neural foraminal narrowing at the remaining levels. Additional sites of foraminal stenosis, as detailed and greatest on the right at C6-C7 (moderate at this site).   Nonspecific straightening of the expected cervical lordosis.   MRI cervical spine 10/12/2017:     1. Diffuse cervical spine spondylosis as described above. 2. At C4-5 there is  a broad-based disc bulge with a small central disc protrusion contacting the ventral cervical spinal cord. Bilateral uncovertebral degenerative changes. Mild bilateral facet arthropathy. Moderate left foraminal stenosis.  MRI lumbar spine 10/12/2017:   Edema and enhancement of the inferior endplate of L3 and the superior endplate of L4 apparently secondary to acute Schmorl's nodes. This could certainly be associated with back pain. No evidence of fluid intensity material or enhancement within the  disc, making disc space infection unlikely. Metastatic disease would not have this pattern.   L3-4 disc bulge and mild bilateral facet hypertrophy. Mild stenosis of both lateral recesses which could possibly be symptomatic.   L4-5 right hemilaminectomy.  No stenosis or neural compression.   L5-S1 asymmetric posterior elements, chronic. No stenosis or neural compression.   Labs:  12/11/2019   SPEP, anti-Hu/Yo/Ri, ESR, SSA/SSB, Hep C, cryoglobulin were negative/noncontribuary  01/15/2020 NCV/EMG study shows the following: 1.  Severe median neuropathy across the left wrist (severe carpal tunnel syndrome). 2.  Minimal median neuropathy across the right wrist 3.  Length dependent axonal polyneuropathy affecting small fibers and large fibers.   REVIEW OF SYSTEMS: Constitutional: No fevers, chills, sweats, or change in appetite Eyes: No visual changes, double vision, eye pain Ear, nose and throat: No hearing loss, ear pain, nasal congestion, sore throat Cardiovascular: No chest pain, palpitations Respiratory:  No shortness of breath at rest or with exertion.   No wheezes GastrointestinaI: No nausea, vomiting, diarrhea, abdominal pain, fecal incontinence Genitourinary:  No dysuria, urinary retention or frequency.  No nocturia. Musculoskeletal:  No neck pain, back pain Integumentary: No rash, pruritus, skin lesions Neurological: as above Psychiatric: No depression at this time.  No anxiety Endocrine: No palpitations, diaphoresis, change in appetite, change in weigh or increased thirst Hematologic/Lymphatic:  No anemia, purpura, petechiae. Allergic/Immunologic: No itchy/runny eyes, nasal congestion, recent allergic reactions, rashes  ALLERGIES: Allergies  Allergen Reactions   Nickel Swelling   Sulfa Antibiotics Hives   Clarithromycin Rash   Codeine Itching and Other (See Comments)    Headaches   Lamotrigine Rash   Tape Itching    HOME MEDICATIONS:  Current Outpatient  Medications:    albuterol (VENTOLIN HFA) 108 (90 Base) MCG/ACT inhaler, Inhale 2 puffs into the lungs every 6 (six) hours as needed for wheezing or shortness of breath. , Disp: , Rfl:    cetirizine (ZYRTEC) 10 MG tablet, Take 10 mg by mouth daily as needed for allergies. , Disp: , Rfl:    Cholecalciferol (VITAMIN D3 PO), Take 1 tablet by mouth daily., Disp: , Rfl:    clotrimazole-betamethasone (LOTRISONE) cream, Apply to affected area 2 times daily until symptoms improve. (Patient taking differently: Apply 1 Application topically daily as needed (pain).), Disp: 45 g, Rfl: 0   DULoxetine (CYMBALTA) 60 MG capsule, Take 60 mg by mouth daily., Disp: , Rfl:    enalapril (VASOTEC) 10 MG tablet, Take 10 mg by mouth daily. , Disp: , Rfl:    fluticasone (FLONASE) 50 MCG/ACT nasal spray, Place 1-2 sprays into both nostrils daily as needed for allergies or rhinitis., Disp: , Rfl:    levETIRAcetam (KEPPRA) 750 MG tablet, Take 750 mg by mouth 2 (two) times daily., Disp: , Rfl:    linaclotide (LINZESS) 145 MCG CAPS capsule, Take 145 mcg by mouth daily before breakfast., Disp: , Rfl:    methocarbamol (ROBAXIN-750) 750 MG tablet, Take 1 tablet (750 mg total) by mouth 4 (four) times daily. (Patient taking differently: Take 750 mg by mouth 3 (three) times  daily.), Disp: 45 tablet, Rfl: 0   metoprolol (LOPRESSOR) 50 MG tablet, Take 50 mg by mouth 2 (two) times daily., Disp: , Rfl:    montelukast (SINGULAIR) 10 MG tablet, Take 10 mg by mouth at bedtime. , Disp: , Rfl:    Multiple Vitamin (MULTIVITAMIN WITH MINERALS) TABS tablet, Take 1 tablet by mouth daily., Disp: , Rfl:    Multiple Vitamins-Minerals (HAIR/SKIN/NAILS) CAPS, Take 2 capsules by mouth daily., Disp: , Rfl:    NON FORMULARY, Take 0.5 tablets by mouth daily as needed (pain). CBD Gummies, Disp: , Rfl:    nystatin (MYCOSTATIN/NYSTOP) powder, Apply 1 Application topically 3 (three) times daily. Apply 3 times daily until symptoms improve., Disp: 60 g, Rfl: 0    octreotide (SANDOSTATIN LAR) 10 MG injection, Inject 10 mg into the muscle every 28 (twenty-eight) days., Disp: , Rfl:    pantoprazole (PROTONIX) 20 MG tablet, Take 20 mg by mouth daily., Disp: , Rfl:    polyethylene glycol powder (GLYCOLAX/MIRALAX) 17 GM/SCOOP powder, Take 17 g by mouth daily as needed for mild constipation or moderate constipation., Disp: , Rfl:    Sod Picosulfate-Mag Ox-Cit Acd (CLENPIQ) 10-3.5-12 MG-GM -GM/175ML SOLN, Take 1 kit by mouth as directed., Disp: 350 mL, Rfl: 0   trolamine salicylate (BLUE-EMU HEMP) 10 % cream, Apply 1 Application topically as needed for muscle pain., Disp: , Rfl:   PAST MEDICAL HISTORY: Past Medical History:  Diagnosis Date   Accessory spleen    Anxiety    Arthritis    multiple areas of her body   Asthma    Cancer ([place])    located on her aorta and small intestines- on Octreodtide 86m IM every 28 days for this   Depression    Diabetes mellitus    diet controlled   Dyspnea    today 11/09/2017, reports that her breathing is normal for her    Dysrhythmia    hx rapid heart beat   Fibromyalgia    GERD (gastroesophageal reflux disease)    History of hiatal hernia    Hyperlipidemia    Hypertension    Myalgia and myositis, unspecified    Polyneuropathy    Sleep apnea    no cpap used, pt does not like, use to use CPAP but due to insurance had to return the device    Weight loss 02/25/2011    PAST SURGICAL HISTORY: Past Surgical History:  Procedure Laterality Date   ABDOMINAL HYSTERECTOMY  yrs ago   ovaries remain, done because of endometriosis   ANTERIOR CERVICAL DECOMP/DISCECTOMY FUSION N/A 11/16/2017   Procedure: Anterior Cervical Decompression/Discectomy Fusion - Cervical four-Cervical five - Cervical five-Cervical six - Cervical six-Cervical seven;  Surgeon: JEustace Moore MD;  [facility]  Service: Neurosurgery;  Laterality: N/A;   ANTERIOR CERVICAL DECOMP/DISCECTOMY FUSION N/A 04/05/2022   Procedure: Anterior Cervical  Discectomy and Fusion Cervical Three-Four, Removal of Plate Cervical Four-Five;  Surgeon: JEustace Moore MD;  [facility]  Service: Neurosurgery;  Laterality: N/A;  3C   BACK SURGERY  yrs ago   lower back   BIOPSY  08/18/2022   Procedure: BIOPSY;  Surgeon: RDaneil Dolin MD;  [facility];  Service: Endoscopy;;   BREAST BIOPSY Bilateral    benign biopsies   BREAST LUMPECTOMY     both breast   CARDIAC CATHETERIZATION     CARPAL TUNNEL RELEASE Right 2003   CARPAL TUNNEL RELEASE Left 02/12/2020   Procedure: CARPAL TUNNEL RELEASE;  Surgeon: HArther Abbott  E, MD;  [facility]: [facility];  Service: Orthopedics;  Laterality: Left;   CHOLECYSTECTOMY  yrs ago   COLONOSCOPY  06/2012   normal   COLONOSCOPY WITH PROPOFOL N/A 08/18/2022   Procedure: COLONOSCOPY WITH PROPOFOL;  Surgeon: Daneil Dolin, MD;  [facility];  Service: Endoscopy;  Laterality: N/A;  10:00am, asa 3   ESOPHAGEAL DILATION  06/14/2012   Procedure: ESOPHAGEAL DILATION;  Surgeon: Daneil Dolin, MD;  [facility];  Service: Endoscopy;;   EUS  07/13/2012   Procedure: UPPER ENDOSCOPIC ULTRASOUND (EUS) LINEAR;  Surgeon: Milus Banister, MD;  [facility];  Service: Endoscopy;  Laterality: N/A;   FLEXIBLE SIGMOIDOSCOPY N/A 03/24/2022   Procedure: FLEXIBLE SIGMOIDOSCOPY;  Surgeon: Daneil Dolin, MD;  [facility];  Service: Endoscopy;  Laterality: N/A;   NOSE SURGERY  yrs ago   ROTATOR CUFF REPAIR  yrs ago   left    FAMILY HISTORY: Family History  Problem Relation Age of Onset   Cancer Mother        died age 60, ?not sure what kind, metastatic at time of diagnosis   Colon cancer Father    Colon cancer Maternal Aunt    Colon cancer Maternal Uncle     SOCIAL HISTORY:  Social History   Socioeconomic History   Marital status: Divorced    Spouse name: Not on file   Number of children: 1   Years of education: Some college   Highest education level: Some college,  no degree  Occupational History   Occupation: Retired  Tobacco Use   Smoking status: Former    Packs/day: 0.50    Years: 20.00    Total pack years: 10.00    Types: Cigarettes    Quit date: 09/13/1992    Years since quitting: 29.9   Smokeless tobacco: Never   Tobacco comments:    quit about 25 + years ago  Vaping Use   Vaping Use: Never used  Substance and Sexual Activity   Alcohol use: No   Drug use: No   Sexual activity: Yes  Other Topics Concern   Not on file  Social History Narrative   Lives alone   Caffeine use: rare   Right handed    Social Determinants of Health   Financial Resource Strain: Not on file  Food Insecurity: Not on file  Transportation Needs: Not on file  Physical Activity: Not on file  Stress: Not on file  Social Connections: Not on file  Intimate Partner Violence: Not on file     PHYSICAL EXAM  Vitals:   08/26/22 1253  BP: 139/81  Pulse: 73  Weight: 173 lb (78.5 kg)  Height: 5' (1.524 m)     Body mass index is 33.79 kg/m.   General: The patient is well-developed and well-nourished and in no acute distress.   ACDF scar on neck.  Ok ROM  HEENT:  Head is [place]/AT.  Sclera are anicteric.    Skin: Extremities are without rash or  edema.  Musculoskeletal:  Back is nontender  Neurologic Exam  Mental status: The patient is alert and oriented x 3 at the time of the examination. The patient has apparent normal recent and remote memory, with an apparently normal attention span and concentration ability.   Speech is normal.  Cranial nerves: Extraocular movements are full. Facial strength and sensation are normal.  . No obvious hearing deficits are noted.  Motor:  Muscle bulk is normal.  Tone is normal. Strength is  5 / 5 except  4+ EHL   Sensory: Sensory testing is intact to pinprick, soft touch and vibration sensation in the arms and proximal legs. Reduced vibration and PP sensation at toes  Coordination: Cerebellar testing reveals good  finger-nose-finger and heel-to-shin bilaterally.  Gait and station: Station is normal.   Gait and tandem gait are mildly wide.. Romberg is negative.   Reflexes: Deep tendon reflexes are symmetric and normal bilaterally.       ASSESSMENT AND PLAN  Myelopathy concurrent with and due to spinal stenosis of cervical region [facility])  Cervical stenosis of spine  Propriospinal myoclonus  Polyneuropathy  Dysesthesia  Type 2 diabetes mellitus with diabetic polyneuropathy, without long-term current use of insulin ([place])   She will taper the Keppra off as the propriospinal myoclonus is bettter and she has since had surgery.    If dysesthesias worsen, consider adding VImpat Rtc prn if new or worsening symptoms.       Lexani Corona A. Felecia Shelling, MD, [facility] 15/40/0867, 6:19 PM Certified in Neurology, Clinical Neurophysiology, Sleep Medicine and Neuroimaging  [facility] [address], [place], [place] [address] [phone]

## 2022-10-04 ENCOUNTER — Encounter (HOSPITAL_BASED_OUTPATIENT_CLINIC_OR_DEPARTMENT_OTHER): Payer: Self-pay

## 2022-10-04 DIAGNOSIS — G4733 Obstructive sleep apnea (adult) (pediatric): Secondary | ICD-10-CM

## 2022-10-04 DIAGNOSIS — R0683 Snoring: Secondary | ICD-10-CM

## 2022-10-20 ENCOUNTER — Emergency Department (HOSPITAL_COMMUNITY): Payer: Medicare HMO

## 2022-10-20 ENCOUNTER — Encounter (HOSPITAL_COMMUNITY): Payer: Self-pay

## 2022-10-20 ENCOUNTER — Other Ambulatory Visit: Payer: Self-pay

## 2022-10-20 ENCOUNTER — Emergency Department (HOSPITAL_COMMUNITY)
Admission: EM | Admit: 2022-10-20 | Discharge: 2022-10-21 | Disposition: A | Discharge status: Home / Self Care | Payer: Medicare HMO | Attending: Emergency Medicine | Admitting: Emergency Medicine

## 2022-10-20 DIAGNOSIS — Z79899 Other long term (current) drug therapy: Secondary | ICD-10-CM | POA: Insufficient documentation

## 2022-10-20 DIAGNOSIS — W010XXA Fall on same level from slipping, tripping and stumbling without subsequent striking against object, initial encounter: Secondary | ICD-10-CM | POA: Diagnosis not present

## 2022-10-20 DIAGNOSIS — W19XXXA Unspecified fall, initial encounter: Secondary | ICD-10-CM

## 2022-10-20 DIAGNOSIS — M546 Pain in thoracic spine: Secondary | ICD-10-CM | POA: Diagnosis not present

## 2022-10-20 DIAGNOSIS — M48061 Spinal stenosis, lumbar region without neurogenic claudication: Secondary | ICD-10-CM | POA: Insufficient documentation

## 2022-10-20 DIAGNOSIS — M545 Low back pain, unspecified: Secondary | ICD-10-CM | POA: Insufficient documentation

## 2022-10-20 DIAGNOSIS — K5790 Diverticulosis of intestine, part unspecified, without perforation or abscess without bleeding: Secondary | ICD-10-CM | POA: Insufficient documentation

## 2022-10-20 DIAGNOSIS — I700 Atherosclerosis of aorta: Secondary | ICD-10-CM | POA: Diagnosis not present

## 2022-10-20 DIAGNOSIS — M533 Sacrococcygeal disorders, not elsewhere classified: Secondary | ICD-10-CM | POA: Diagnosis present

## 2022-10-20 DIAGNOSIS — M47816 Spondylosis without myelopathy or radiculopathy, lumbar region: Secondary | ICD-10-CM | POA: Insufficient documentation

## 2022-10-20 NOTE — ED Triage Notes (Signed)
Rcems from home. Cc of a fall.  Says she fell backwards from standing.  Says her lower back hurts  Ems states that patient was ambulatory after they helped her up. Uses cane.

## 2022-10-21 ENCOUNTER — Emergency Department (HOSPITAL_COMMUNITY): Payer: Medicare HMO

## 2022-10-21 MED ORDER — HYDROMORPHONE HCL 1 MG/ML IJ SOLN
1.0000 mg | Freq: Once | INTRAMUSCULAR | Status: AC
  Administered 2022-10-21: 1 mg via INTRAVENOUS
  Filled 2022-10-21: qty 1

## 2022-10-21 MED ORDER — KETOROLAC TROMETHAMINE 30 MG/ML IJ SOLN
15.0000 mg | Freq: Once | INTRAMUSCULAR | Status: AC
  Administered 2022-10-21: 15 mg via INTRAVENOUS
  Filled 2022-10-21: qty 1

## 2022-10-21 MED ORDER — MELOXICAM 7.5 MG PO TABS: 7.5000 mg | ORAL_TABLET | Freq: Every day | ORAL | 0 refills | Status: DC

## 2022-10-21 NOTE — ED Notes (Signed)
Patient transported to CT 

## 2022-10-21 NOTE — ED Provider Notes (Signed)
[facility] Provider Note   CSN: [id number] Arrival date & time: 10/20/22  2136     History  Chief Complaint  Patient presents with   Lytle Michaels    Pamela Hatfield is a 72 y.o. female.  72 year old female that lost her balance and fell backwards landing on her coccyx.  She felt a snap.  Has pain.  Per EMS she is able to ambulate per patient it hurts to walk.  No injuries elsewhere.  She has osteoporosis and also peripheral neuropathy.   Fall       Home Medications Prior to Admission medications   Medication Sig Start Date End Date Taking? Authorizing Provider  meloxicam (MOBIC) 7.5 MG tablet Take 1 tablet (7.5 mg total) by mouth daily. 10/21/22  Yes Amaro Mangold, Corene Cornea, MD  albuterol (VENTOLIN HFA) 108 (90 Base) MCG/ACT inhaler Inhale 2 puffs into the lungs every 6 (six) hours as needed for wheezing or shortness of breath.  01/08/20   [provider]  cetirizine (ZYRTEC) 10 MG tablet Take 10 mg by mouth daily as needed for allergies.     [provider]  Cholecalciferol (VITAMIN D3 PO) Take 1 tablet by mouth daily.    [provider]  clotrimazole-betamethasone (LOTRISONE) cream Apply to affected area 2 times daily until symptoms improve. Patient taking differently: Apply 1 Application topically daily as needed (pain). 03/29/22   Leath-Warren, Alda Lea, NP  DULoxetine (CYMBALTA) 60 MG capsule Take 60 mg by mouth daily. 12/02/21   [provider]  enalapril (VASOTEC) 10 MG tablet Take 10 mg by mouth daily.     [provider]  fluticasone (FLONASE) 50 MCG/ACT nasal spray Place 1-2 sprays into both nostrils daily as needed for allergies or rhinitis.    [provider]  levETIRAcetam (KEPPRA) 750 MG tablet Take 750 mg by mouth 2 (two) times daily. 07/15/22   [provider]  linaclotide (LINZESS) 145 MCG CAPS capsule Take 145 mcg by mouth daily before breakfast.    [provider]   methocarbamol (ROBAXIN-750) 750 MG tablet Take 1 tablet (750 mg total) by mouth 4 (four) times daily. Patient taking differently: Take 750 mg by mouth 3 (three) times daily. 04/06/22   Meyran, Ocie Cornfield, NP  metoprolol (LOPRESSOR) 50 MG tablet Take 50 mg by mouth 2 (two) times daily.    [provider]  montelukast (SINGULAIR) 10 MG tablet Take 10 mg by mouth at bedtime.     [provider]  Multiple Vitamin (MULTIVITAMIN WITH MINERALS) TABS tablet Take 1 tablet by mouth daily.    [provider]  Multiple Vitamins-Minerals (HAIR/SKIN/NAILS) CAPS Take 2 capsules by mouth daily.    [provider]  NON FORMULARY Take 0.5 tablets by mouth daily as needed (pain). CBD Gummies    [provider]  nystatin (MYCOSTATIN/NYSTOP) powder Apply 1 Application topically 3 (three) times daily. Apply 3 times daily until symptoms improve. 03/29/22   Leath-Warren, Alda Lea, NP  octreotide (SANDOSTATIN LAR) 10 MG injection Inject 10 mg into the muscle every 28 (twenty-eight) days.    [provider]  pantoprazole (PROTONIX) 20 MG tablet Take 20 mg by mouth daily.    [provider]  polyethylene glycol powder (GLYCOLAX/MIRALAX) 17 GM/SCOOP powder Take 17 g by mouth daily as needed for mild constipation or moderate constipation.    [provider]  Sod Picosulfate-Mag Ox-Cit Acd (CLENPIQ) 10-3.5-12 MG-GM -GM/175ML SOLN Take 1 kit by mouth as  directed. 08/12/22   Rourk, Cristopher Estimable, MD  trolamine salicylate (BLUE-EMU HEMP) 10 % cream Apply 1 Application topically as needed for muscle pain.    [provider]      Allergies    Nickel, Sulfa antibiotics, Clarithromycin, Codeine, Lamotrigine, and Tape    Review of Systems   Review of Systems  Physical Exam Updated Vital Signs BP (!) 158/77   Pulse 63   Temp 98.2 F (36.8 C) (Oral)   Resp 16   Ht 5' (1.524 m)   Wt 79 kg   SpO2 96%   BMI 34.01 kg/m  Physical Exam Vitals and  nursing note reviewed.  Constitutional:      Appearance: She is well-developed.  HENT:     Head: Normocephalic and atraumatic.  Cardiovascular:     Rate and Rhythm: Normal rate and regular rhythm.  Pulmonary:     Effort: No respiratory distress.     Breath sounds: No stridor.  Abdominal:     General: There is no distension.  Musculoskeletal:        General: Tenderness (Mild to the lower thoracic upper lumbar and associated paraspinal muscles.) present.     Cervical back: Normal range of motion.  Neurological:     Mental Status: She is alert.     ED Results / Procedures / Treatments   Labs (all labs ordered are listed, but only abnormal results are displayed) Labs Reviewed - No data to display  EKG None  Radiology CT Lumbar Spine Wo Contrast  Result Date: 10/21/2022 CLINICAL DATA:  Fall injury with back pain. EXAM: CT LUMBAR SPINE WITHOUT CONTRAST TECHNIQUE: Multidetector CT imaging of the lumbar spine was performed without intravenous contrast administration. Multiplanar CT image reconstructions were also generated. RADIATION DOSE REDUCTION: This exam was performed according to the departmental dose-optimization program which includes automated exposure control, adjustment of the mA and/or kV according to patient size and/or use of iterative reconstruction technique. COMPARISON:  Lumbar spine series yesterday, lumbar spine series 06/26/2021, and CT abdomen and pelvis 08/10/2022 FINDINGS: Segmentation: There are 6 lumbar type segments. Alignment: Normal. Vertebrae: Osteopenia. There is minimal upper plate anterior wedge deformity of the L1 vertebral body, unchanged. There is slight chronic upper plate flattening of L5, stable. There is a moderate chronic T12 compression fracture visible on the scout image but not included in the cross-sectional images. No new or further vertebral height loss is seen. There are no destructive bone lesions. There are chronic nondisplaced ununited chip  fractures of the posterosuperior tips of the spinous processes of L3, 4 and 5. There is no evidence of acute fracture. Visualized portions of the sacrum and SI joints are unremarkable apart from SI joint spurring. There is mild-to-moderate lumbar spondylosis. There are no bulky bridging osteophytes. Paraspinal and other soft tissues: Mild aortic atherosclerosis. No acute abnormality. Disc levels: There is mild disc space loss at L1-2, L2-3, and L4-5. All other lumbar discs are normal in height. At T12-L1 there is a small cephalad migrated calcified central disc extrusion as well as a small calcified disc extrusion centrally just below the level of the disc. There is no significant mass effect on the thecal sac. There are no significant findings at L1-2 aside from mild disc narrowing. At L2-3, in addition to mild disc space loss there is a mild diffuse annular bulge, without herniation or stenosis. The foramina are clear. There are similar findings at L3-4 with also noted mild spinal canal stenosis due to ligamentous and facet  hypertrophy in addition to the bulging annulus. Foramina are clear. At L4-5, there is moderate spinal canal stenosis due to circumferential disc osteophyte complex, ligamentous and facet hypertrophy, with completely effaced lateral recesses and bilateral mild-to-moderate foraminal stenosis. At L5-6, there is mild spinal canal stenosis due to facet hypertrophy, posterior disc osteophyte complex and ligamentous thickening, with effaced lateral recesses. There is mild foraminal stenosis. At L6-S1 facet hypertrophy effaces the lateral recesses. There is no disc bulge or herniation. The foramina are patent. IMPRESSION: 1. Osteopenia and chronic changes without evidence of acute fractures. 2. Chronic compression fracture at T12 with minimal chronic upper plate wedging at L1, chronic slight flattening of the L5 superior endplate. 3. Chronic nondisplaced ununited chip fractures of the posterosuperior  tips of the L3, L4 and L5 spinous processes. 4. Degenerative changes with moderate spinal canal stenosis at L4-5, mild spinal canal stenosis at L3-4 and L5-6, and bilateral lateral recess effacement at L4-5 through L6-S1. 5. Mild-to-moderate foraminal stenosis at L4-5 and mild foraminal stenosis at L5-6. 6. Aortic atherosclerosis. Aortic Atherosclerosis (ICD10-I70.0). Electronically Signed   By: Telford Nab M.D.   On: 10/21/2022 02:36   CT PELVIS WO CONTRAST  Result Date: 10/21/2022 CLINICAL DATA:  Fall with back pain and hip pain. EXAM: CT PELVIS WITHOUT CONTRAST TECHNIQUE: Multidetector CT imaging of the pelvis was performed following the standard protocol without intravenous contrast. RADIATION DOSE REDUCTION: This exam was performed according to the departmental dose-optimization program which includes automated exposure control, adjustment of the mA and/or kV according to patient size and/or use of iterative reconstruction technique. COMPARISON:  AP pelvis plain film yesterday, AP pelvis and right hip x-rays 06/26/2021, and CT abdomen and pelvis with contrast 05/06/2015. FINDINGS: Urinary Tract: The pelvic ureters are small in caliber with no distal ureteral stones. The bladder wall and lumen are unremarkable. Bowel: No dilatation or wall thickening including the appendix. Uncomplicated sigmoid diverticulosis. Vascular/Lymphatic: No pathologically enlarged lymph nodes. No significant vascular abnormality seen. There are multiple pelvic phleboliths. Reproductive: Status post hysterectomy. No adnexal mass or other focal abnormality. Other: There are small inguinal fat hernias. There is no incarcerated hernia. There is a 1.5 cm air-fluid pocket in the right buttock consistent with recent injection. If there has been no recent injection in this location, and abscess could be considered. Musculoskeletal: There is osteopenia with mild symmetric degenerative arthrosis of the hips with small acetabular and  femoral head osteophytes, symmetric partial joint space loss. There is spurring of the SI joints and pubic symphysis. The SI joints are patent. There are enthesopathic changes of the pelvis. Evidence of fractures is not seen. There is no evidence of avascular necrosis of the femoral heads. Visualized lower lumbar segments are intact. There is facet hypertrophy at L4-5 and L5-S1 effacing the lateral recesses at these levels. IMPRESSION: 1. Osteopenia and degenerative change without evidence of fractures. 2. 1.5 cm air-fluid pocket in the right buttock soft tissues consistent with a recent injection. If there has been no recent injection in this location, abscess could be considered. 3. Small inguinal fat hernias. 4. Diverticulosis. 5. Lower lumbar facet hypertrophy effacing the lateral recesses at L4-5 and L5-S1. Electronically Signed   By: Telford Nab M.D.   On: 10/21/2022 02:06   CT Thoracic Spine Wo Contrast  Result Date: 10/21/2022 CLINICAL DATA:  Initial evaluation for acute trauma, fall. EXAM: CT THORACIC SPINE WITHOUT CONTRAST TECHNIQUE: Multidetector CT images of the thoracic were obtained using the standard protocol without intravenous contrast. RADIATION DOSE REDUCTION:  This exam was performed according to the departmental dose-optimization program which includes automated exposure control, adjustment of the mA and/or kV according to patient size and/or use of iterative reconstruction technique. COMPARISON:  Comparison made with prior radiograph from 10/20/2022. FINDINGS: Alignment: Mild diffuse dextroscoliosis. Slight focal kyphotic angulation at the T11-12 level related to a chronic T12 compression fracture. Straightening of the normal lumbar lordosis elsewhere. No listhesis. Vertebrae: Chronic T12 compression fracture with up to 40-50% height loss without significant retropulsion. Vertebral body height otherwise maintained with no other acute or recent fracture. Visualized ribs intact. No worrisome  osseous lesions. Paraspinal and other soft tissues: Paraspinous soft tissues demonstrate no acute finding. Mild aortic atherosclerosis. Prior cholecystectomy noted. Disc levels: Multilevel bridging osteophytic spurring noted throughout the thoracic spine, suggestive of DISH. Mild multilevel facet hypertrophy. No high-grade spinal stenosis visible by CT. IMPRESSION: 1. No acute osseous injury within the thoracic spine. 2. Chronic T12 compression fracture. Aortic Atherosclerosis (ICD10-I70.0). Electronically Signed   By: Jeannine Boga M.D.   On: 10/21/2022 01:55   DG Lumbar Spine Complete  Result Date: 10/20/2022 CLINICAL DATA:  Fall with back pain EXAM: LUMBAR SPINE - COMPLETE 4+ VIEW COMPARISON:  06/26/2021 FINDINGS: Transitional anatomy, for the purposes of reporting, transitional segment will be designated lumbarized S1. Chronic compression deformity at T12. Remaining vertebra demonstrate normal alignment. Normal vertebral body heights. Mild multilevel degenerative change with osteophytes. Facet degenerative change of the lower lumbar spine. IMPRESSION: Chronic compression deformity at T12. No acute osseous abnormality. Similar degenerative changes. Electronically Signed   By: Donavan Foil M.D.   On: 10/20/2022 23:53   DG Pelvis 1-2 Views  Result Date: 10/20/2022 CLINICAL DATA:  Fall EXAM: PELVIS - 1-2 VIEW COMPARISON:  None Available. FINDINGS: There is no evidence of pelvic fracture or diastasis. No pelvic bone lesions are seen. IMPRESSION: Negative. Electronically Signed   By: Donavan Foil M.D.   On: 10/20/2022 23:52    Procedures Procedures    Medications Ordered in ED Medications  ketorolac (TORADOL) 30 MG/ML injection 15 mg (15 mg Intravenous Given 10/21/22 0108)  HYDROmorphone (DILAUDID) injection 1 mg (1 mg Intravenous Given 10/21/22 0109)    ED Course/ Medical Decision Making/ A&P                             Medical Decision Making Amount and/or Complexity of Data  Reviewed Radiology: ordered.  Risk Prescription drug management.   X-rays and CT scans are reassuring on my interpretation without any obvious bony fractures.  She has a chronic compression fracture noted by radiologist.  Symptoms treated which seem to help.  Will DC on symptomatic treatment and PCP follow-up if not improving.  Final Clinical Impression(s) / ED Diagnoses Final diagnoses:  Fall, initial encounter    Rx / DC Orders ED Discharge Orders          Ordered    meloxicam (MOBIC) 7.5 MG tablet  Daily        10/21/22 0249              Kimetha Trulson, Corene Cornea, MD 10/21/22 0301

## 2022-11-02 ENCOUNTER — Ambulatory Visit: Payer: Medicare HMO | Attending: Geriatric Medicine | Admitting: Pulmonary Disease

## 2022-11-02 DIAGNOSIS — R0683 Snoring: Secondary | ICD-10-CM | POA: Diagnosis present

## 2022-11-02 DIAGNOSIS — G4733 Obstructive sleep apnea (adult) (pediatric): Secondary | ICD-10-CM | POA: Insufficient documentation

## 2022-11-09 DIAGNOSIS — R0683 Snoring: Secondary | ICD-10-CM

## 2022-11-09 NOTE — Procedures (Signed)
      Patient Name: Pamela Hatfield, Pamela Hatfield Date: 11/02/2022 Gender: Female D.O.B: 13-Dec-1950 Age (years): 71 Referring Provider: Gilmore Laroche MD Height (inches): 65 Interpreting Physician: Chesley Mires MD, ABSM Weight (lbs): 174 RPSGT: Rosebud Poles BMI: 30 MRN: [id number] Neck Size: 17.00  CLINICAL INFORMATION Sleep Study Type: NPSG  Indication for sleep study: snoring, sleep disruption and daytime sleepiness  Epworth Sleepiness Score: 10  Most recent polysomnogram dated 09/26/2020 revealed an AHI of 13.0/h and RDI of 13.0/h. Most recent titration study dated 02/06/2016 was optimal at 12cm H2O with an AHI of 7.2/h.  SLEEP STUDY TECHNIQUE As per the AASM Manual for the Scoring of Sleep and Associated Events v2.3 (April 2016) with a hypopnea requiring 4% desaturations.  The channels recorded and monitored were frontal, central and occipital EEG, electrooculogram (EOG), submentalis EMG (chin), nasal and oral airflow, thoracic and abdominal wall motion, anterior tibialis EMG, snore microphone, electrocardiogram, and pulse oximetry.  MEDICATIONS Medications self-administered by patient taken the night of the study : N/A  SLEEP ARCHITECTURE The study was initiated at 10:07:04 PM and ended at 4:39:32 AM.  Sleep onset time was 98.3 minutes and the sleep efficiency was 49.1%. The total sleep time was 192.7 minutes.  Stage REM latency was N/A minutes.  The patient spent 7.52% of the night in stage N1 sleep, 92.48% in stage N2 sleep, 0.00% in stage N3 and 0% in REM.  Alpha intrusion was absent.  Supine sleep was 0.00%.  RESPIRATORY PARAMETERS The overall apnea/hypopnea index (AHI) was 81.0 per hour. There were 7 total apneas, including 4 obstructive, 2 central and 1 mixed apneas. There were 253 hypopneas and 0 RERAs.  The AHI during Stage REM sleep was N/A per hour.  AHI while supine was N/A per hour.  The mean oxygen saturation was 92.81%. The minimum SpO2 during sleep  was 82.00%.  loud snoring was noted during this study.  CARDIAC DATA The 2 lead EKG demonstrated sinus rhythm. The mean heart rate was 93.15 beats per minute. Other EKG findings include: PVCs.  LEG MOVEMENT DATA The total PLMS were 554 with a resulting PLMS index of 172.49. Associated arousal with leg movement index was 16.8 .  IMPRESSIONS - Severe obstructive sleep apnea with an AHI of 81 and SpO2 low of 82%. - The patient snored with loud snoring volume. - EKG findings include PVCs. - Severe periodic limb movements of sleep occurred during the study. Associated arousals were significant.  DIAGNOSIS - Obstructive Sleep Apnea (G47.33) - Periodic Limb Movement During Sleep (G47.61)  RECOMMENDATIONS - Additional therapies for obstructive sleep apnea include CPAP, oral appliance, or surgical assessmetn. - She should be assessed for the presence of restless leg syndrome in relation to periodic limb movements during sleep. - Avoid alcohol, sedatives and other CNS depressants that may worsen sleep apnea and disrupt normal sleep architecture. - Sleep hygiene should be reviewed to assess factors that may improve sleep quality. - Weight management and regular exercise should be initiated or continued if appropriate. - Sleep medicine consultation available as needed.  [Electronically signed] 11/09/2022 02:56 PM  Chesley Mires MD, ABSM Diplomate, American Board of Sleep Medicine NPI: [id number]  [place] PH: [phone]   FX: [phone] [place]

## 2022-11-11 ENCOUNTER — Encounter: Payer: Self-pay | Admitting: Radiology

## 2022-12-06 ENCOUNTER — Other Ambulatory Visit (HOSPITAL_COMMUNITY): Payer: Self-pay | Admitting: Geriatric Medicine

## 2022-12-06 DIAGNOSIS — R0602 Shortness of breath: Secondary | ICD-10-CM

## 2022-12-06 DIAGNOSIS — K5900 Constipation, unspecified: Secondary | ICD-10-CM

## 2022-12-10 ENCOUNTER — Ambulatory Visit (HOSPITAL_COMMUNITY)
Admission: RE | Admit: 2022-12-10 | Discharge: 2022-12-10 | Disposition: A | Discharge status: Home / Self Care | Payer: Medicare HMO | Source: Ambulatory Visit | Attending: Geriatric Medicine | Admitting: Geriatric Medicine

## 2022-12-10 DIAGNOSIS — K5900 Constipation, unspecified: Secondary | ICD-10-CM | POA: Insufficient documentation

## 2022-12-10 DIAGNOSIS — R0602 Shortness of breath: Secondary | ICD-10-CM | POA: Insufficient documentation

## 2023-01-04 ENCOUNTER — Ambulatory Visit (HOSPITAL_COMMUNITY): Payer: Medicare HMO | Attending: Geriatric Medicine | Admitting: Physical Therapy

## 2023-01-04 DIAGNOSIS — M5459 Other low back pain: Secondary | ICD-10-CM | POA: Diagnosis present

## 2023-01-04 NOTE — Therapy (Signed)
OUTPATIENT PHYSICAL THERAPY THORACOLUMBAR EVALUATION   Patient Name: Pamela Hatfield MRN: [id number] DOB:29-Mar-1951, 72 y.o., female Today's Date: 01/04/2023  END OF SESSION:  PT End of Session - 01/04/23 1428     Visit Number 1    Number of Visits 8    Date for PT Re-Evaluation 02/01/23    Authorization Type [employer] (check auth)    PT Start Time 1349    PT Stop Time 1430    PT Time Calculation (min) 41 min    Activity Tolerance Patient tolerated treatment well;Patient limited by pain    Behavior During Therapy [facility] for tasks assessed/performed             Past Medical History:  Diagnosis Date   Accessory spleen    Anxiety    Arthritis    multiple areas of her body   Asthma    Cancer (HCC)    located on her aorta and small intestines- on Octreodtide  IM every 28 days for this   Depression    Diabetes mellitus    diet controlled   Dyspnea    today 11/09/2017, reports that her breathing is normal for her    Dysrhythmia    hx rapid heart beat   Fibromyalgia    GERD (gastroesophageal reflux disease)    History of hiatal hernia    Hyperlipidemia    Hypertension    Myalgia and myositis, unspecified    Polyneuropathy    Sleep apnea    no cpap used, pt does not like, use to use CPAP but due to insurance had to return the device    Weight loss 02/25/2011   Past Surgical History:  Procedure Laterality Date   ABDOMINAL HYSTERECTOMY  yrs ago   ovaries remain, done because of endometriosis   ANTERIOR CERVICAL DECOMP/DISCECTOMY FUSION N/A 11/16/2017   Procedure: Anterior Cervical Decompression/Discectomy Fusion - Cervical four-Cervical five - Cervical five-Cervical six - Cervical six-Cervical seven;  Surgeon: Tia Alert, MD;  Location: [facility] OR;  Service: Neurosurgery;  Laterality: N/A;   ANTERIOR CERVICAL DECOMP/DISCECTOMY FUSION N/A 04/05/2022   Procedure: Anterior Cervical Discectomy and Fusion Cervical Three-Four, Removal of Plate Cervical Four-Five;  Surgeon: Tia Alert, MD;  Location: [facility] OR;  Service: Neurosurgery;  Laterality: N/A;  3C   BACK SURGERY  yrs ago   lower back   BIOPSY  08/18/2022   Procedure: BIOPSY;  Surgeon: Corbin Ade, MD;  Location: [facility] SUITE;  Service: Endoscopy;;   BREAST BIOPSY Bilateral    benign biopsies   BREAST LUMPECTOMY     both breast   CARDIAC CATHETERIZATION     CARPAL TUNNEL RELEASE Right 2003   CARPAL TUNNEL RELEASE Left 02/12/2020   Procedure: CARPAL TUNNEL RELEASE;  Surgeon: Vickki Hearing, MD;  Location: [facility] ORS;  Service: Orthopedics;  Laterality: Left;   CHOLECYSTECTOMY  yrs ago   COLONOSCOPY  06/2012   normal   COLONOSCOPY WITH PROPOFOL N/A 08/18/2022   Procedure: COLONOSCOPY WITH PROPOFOL;  Surgeon: Corbin Ade, MD;  Location: [facility];  Service: Endoscopy;  Laterality: N/A;  10:00am, asa 3   ESOPHAGEAL DILATION  06/14/2012   Procedure: ESOPHAGEAL DILATION;  Surgeon: Corbin Ade, MD;  Location: [facility];  Service: Endoscopy;;   EUS  07/13/2012   Procedure: UPPER ENDOSCOPIC ULTRASOUND (EUS) LINEAR;  Surgeon: Rachael Fee, MD;  Location: [facility] ENDOSCOPY;  Service: Endoscopy;  Laterality: N/A;   FLEXIBLE SIGMOIDOSCOPY N/A 03/24/2022   Procedure: FLEXIBLE  SIGMOIDOSCOPY;  Surgeon: Corbin Ade, MD;  Location: [facility];  Service: Endoscopy;  Laterality: N/A;   NOSE SURGERY  yrs ago   ROTATOR CUFF REPAIR  yrs ago   left   Patient Active Problem List   Diagnosis Date Noted   S/P cervical spinal fusion 04/05/2022   Constipation 12/30/2021   FH: colon cancer 12/30/2021   Carcinoid tumor of ileum 12/30/2021   Fracture, [place], right, closed 07/14/2021   DDD (degenerative disc disease), cervical 07/06/2021   Displacement of lumbar intervertebral disc without myelopathy 07/06/2021   Low back pain 07/06/2021   Malignant neoplastic disease 07/06/2021   Osteoarthritis of knee 07/06/2021   Pain in thoracic spine 07/06/2021   Primary localized osteoarthritis of pelvic region  and thigh 07/06/2021   Traumatic myositis ossificans 07/06/2021   Spasm 07/06/2021   Fibromyositis 07/06/2021   Compression fracture of T11 vertebra 02/24/2021   Restless legs 02/23/2021   Submandibular gland swelling 11/13/2020   Chronic sinusitis 11/13/2020   OSA (obstructive sleep apnea) 09/04/2020   Acute sinusitis 09/04/2020   S/P carpal tunnel release left 02/12/20 08/06/2020   Hyponatremia 01/29/2020   Type 2 diabetes mellitus 01/29/2020   Fibromyalgia    Seasonal allergies    Ataxic gait 12/11/2019   Bilateral carpal tunnel syndrome 12/11/2019   Dysesthesia 12/11/2019   Polyneuropathy 12/11/2019   Numbness of lower limb 06/15/2018   Paresthesia of upper limb 06/15/2018   Peripheral neuropathic pain 05/16/2018   Cervical vertebral fusion 11/16/2017   Carpal tunnel syndrome, left upper limb 10/18/2017   Dyspnea on exertion 08/12/2017   Essential hypertension 05/05/2016   Chest pain 05/05/2016   Palpitations 05/05/2016   Mesenteric mass 05/08/2013   Carcinoid tumor of abdomen 04/29/2013    Class: Acute   Malignant carcinoid tumor of other sites (HCC) 04/29/2013   Pancreatic mass 04/06/2013   Mixed hyperlipidemia 03/14/2013   Myalgia and myositis, unspecified 03/14/2013   Nonspecific (abnormal) findings on radiological and other examination of gastrointestinal tract 07/13/2012   Family history of colon cancer 05/23/2012   Weight loss 05/23/2012   Abdominal pain 05/23/2012   GERD (gastroesophageal reflux disease) 05/23/2012   Dysphagia 05/23/2012   Family history of malignant neoplasm of gastrointestinal tract 05/23/2012    PCP: Elesa Massed DO  REFERRING PROVIDER: Tia Alert, MD  REFERRING DIAG: lumbar spondylosis  Rationale for Evaluation and Treatment: Rehabilitation  THERAPY DIAG:  Other low back pain - Plan: PT plan of care cert/re-cert  ONSET DATE: Chronic   SUBJECTIVE:  SUBJECTIVE STATEMENT: Patient presents to therapy with complaint of chronic LBP. She reports back surgery in 1981. Patient states it did well for a little while, but has gotten progressively worse over the last 10 years. She also reports neck surgery x 2. She went to pain management and was taken off most of her pain medicine. They want her to try dry needling. She says she is very limited in what she can do at home now. She is limited to standing about 10 minutes at a time.   PERTINENT HISTORY:  Lumbar surgery, neck surgery x 2, chronic pain, osteopenia  PAIN:  Are you having pain? Yes: NPRS scale: 8/10 Pain location: low back, mostly RT side  Pain description: sharp, shooting  Aggravating factors: WB, prolonged positions, standing, walking  Relieving factors: rest, meds   PRECAUTIONS: None  WEIGHT BEARING RESTRICTIONS: No  FALLS:  Has patient fallen in last 6 months? Yes. Number of falls 1  LIVING ENVIRONMENT: Lives with: lives alone Lives in: House/apartment Stairs: No Has following equipment at home: Single point cane and Environmental consultant - 2 wheeled  OCCUPATION: Retired  PLOF: Independent with basic ADLs  PATIENT GOALS: Be able to have a life   NEXT MD VISIT: None scheduled   OBJECTIVE:   DIAGNOSTIC FINDINGS:  IMPRESSION: 1. Osteopenia and chronic changes without evidence of acute fractures. 2. Chronic compression fracture at T12 with minimal chronic upper plate wedging at L1, chronic slight flattening of the L5 superior endplate. 3. Chronic nondisplaced ununited chip fractures of the posterosuperior tips of the L3, L4 and L5 spinous processes. 4. Degenerative changes with moderate spinal canal stenosis at L4-5, mild spinal canal stenosis at L3-4 and L5-6, and bilateral lateral recess effacement at L4-5 through L6-S1. 5. Mild-to-moderate foraminal stenosis at L4-5 and  mild foraminal stenosis at L5-6. 6. Aortic atherosclerosis.   Aortic Atherosclerosis (ICD10-I70.0).    PATIENT SURVEYS:  FOTO 32%   COGNITION: Overall cognitive status: Within functional limits for tasks assessed     SENSATION: Not tested  PALPATION: Mod TTP about bilat lumbar paraspinals   LUMBAR ROM:   AROM eval  Flexion 75% limited  Extension 100% limited  Right lateral flexion 50% limited  Left lateral flexion 50% limited  Right rotation   Left rotation    (Blank rows = not tested)   LOWER EXTREMITY MMT:    MMT Right eval Left eval  Hip flexion 4+ 4+  Hip extension 3- 3-  Hip abduction 4 4  Hip adduction    Hip internal rotation    Hip external rotation    Knee flexion    Knee extension 4 4  Ankle dorsiflexion 4 4+  Ankle plantarflexion    Ankle inversion    Ankle eversion     (Blank rows = not tested)   FUNCTIONAL TESTS:  2 minute walk test: Complete next session  GAIT: Decreased stride, no AD  TODAY'S TREATMENT:  DATE:  01/04/23 Eval   Trigger Point Dry-Needling  Treatment instructions: Expect mild to moderate muscle soreness. S/S of pneumothorax if dry needled over a lung field, and to seek immediate medical attention should they occur. Patient verbalized understanding of these instructions and education.  Patient Consent Given: Yes Education handout provided: Yes Muscles treated: RT lumbar paraspinal Electrical stimulation performed: No Parameters: N/A Treatment response/outcome: Tolerated well    PATIENT EDUCATION:  Education details: on eval findings, POC, HEP and dry needling application  Person educated: Patient Education method: Explanation Education comprehension: verbalized understanding  HOME EXERCISE PROGRAM: Access Code: [email or web address] URL: [email or web address] Date: 01/04/2023 Prepared  by: Georges Lynch  Exercises - Seated Transversus Abdominis Bracing  - 3 x daily - 7 x weekly - 1 sets - 10 reps - 5 second hold  ASSESSMENT:  CLINICAL IMPRESSION: Patient is a 72 y.o. female who presents to physical therapy with complaint of chronic LBP. Patient demonstrates muscle weakness, reduced ROM, and fascial restrictions which are likely contributing to symptoms of pain and are negatively impacting patient ability to perform ADLs and functional mobility tasks. Patient will benefit from skilled physical therapy services to address these deficits to reduce pain and improve level of function with ADLs and functional mobility tasks.   OBJECTIVE IMPAIRMENTS: Abnormal gait, decreased activity tolerance, decreased balance, decreased mobility, difficulty walking, decreased ROM, decreased strength, hypomobility, increased fascial restrictions, impaired flexibility, improper body mechanics, and pain.   ACTIVITY LIMITATIONS: carrying, lifting, bending, sitting, standing, squatting, sleeping, stairs, transfers, bed mobility, and locomotion level  PARTICIPATION LIMITATIONS: meal prep, cleaning, laundry, driving, shopping, community activity, and yard work  PERSONAL FACTORS: Age, Past/current experiences, and Time since onset of injury/illness/exacerbation are also affecting patient's functional outcome.   REHAB POTENTIAL: Fair See above  CLINICAL DECISION MAKING: Stable/uncomplicated  EVALUATION COMPLEXITY: Low   GOALS: SHORT TERM GOALS: Target date: 01/18/2023  Patient will be independent with initial HEP and self-management strategies to improve functional outcomes Baseline:  Goal status: INITIAL   LONG TERM GOALS: Target date: 02/01/2023  Patient will be independent with advanced HEP and self-management strategies to improve functional outcomes Baseline:  Goal status: INITIAL  2.  Patient will improve FOTO score to predicted value to indicate improvement in functional  outcomes Baseline: 32% Goal status: INITIAL  3.  Patient will report reduction of back pain to <5/10 for improved quality of life and ability to perform ADLs  Baseline: 8/10 Goal status: INITIAL  4. Patient will have equal to or > 4+/5 MMT throughout BLE to improve ability to perform functional mobility, stair ambulation and ADLs.  Baseline: See MMT Goal status: INITIAL  PLAN:  PT FREQUENCY: 1-2x/week  PT DURATION: 4 weeks  PLANNED INTERVENTIONS: Therapeutic exercises, Therapeutic activity, Neuromuscular re-education, Balance training, Gait training, Patient/Family education, Joint manipulation, Joint mobilization, Stair training, Aquatic Therapy, Dry Needling, Electrical stimulation, Spinal manipulation, Spinal mobilization, Cryotherapy, Moist heat, scar mobilization, Taping, Traction, Ultrasound, Biofeedback, Ionotophoresis /ml Dexamethasone, and Manual therapy. Marland Kitchen  PLAN FOR NEXT SESSION: F/U on dry needling. 2 MWT. Progress hip and core strengthening as tolerated.   2:40 PM, 01/04/23 Georges Lynch PT DPT  Physical Therapist with [facility]  [phone]

## 2023-01-04 NOTE — Patient Instructions (Signed)

## 2023-01-06 ENCOUNTER — Other Ambulatory Visit (HOSPITAL_COMMUNITY): Payer: Self-pay | Admitting: Neurological Surgery

## 2023-01-06 DIAGNOSIS — S22080G Wedge compression fracture of T11-T12 vertebra, subsequent encounter for fracture with delayed healing: Secondary | ICD-10-CM

## 2023-01-12 ENCOUNTER — Ambulatory Visit (HOSPITAL_COMMUNITY): Payer: Medicare HMO | Admitting: Physical Therapy

## 2023-01-20 ENCOUNTER — Encounter: Payer: Self-pay | Admitting: Internal Medicine

## 2023-01-20 ENCOUNTER — Ambulatory Visit: Payer: Medicare HMO | Admitting: Internal Medicine

## 2023-01-20 VITALS — BP 147/78 | HR 72 | Ht 64.0 in | Wt 163.8 lb

## 2023-01-20 DIAGNOSIS — R058 Other specified cough: Secondary | ICD-10-CM

## 2023-01-20 DIAGNOSIS — I10 Essential (primary) hypertension: Secondary | ICD-10-CM

## 2023-01-20 MED ORDER — VALSARTAN 160 MG PO TABS: 160.0000 mg | ORAL_TABLET | Freq: Every day | ORAL | 11 refills | Status: DC

## 2023-01-20 NOTE — Patient Instructions (Addendum)
Stop vasotec (enalapril) and take valsartan 160 mg one daily   Change prevacid to Take 30-60 min before first meal of the day and add pepcid 20 mg sometime after supper until you return    GERD (REFLUX)  is an extremely common cause of respiratory symptoms just like yours , many times with no obvious heartburn at all.    It can be treated with medication, but also with lifestyle changes including elevation of the head of your bed (ideally with 6 -8inch blocks under the headboard of your bed),  Smoking cessation, avoidance of late meals, excessive alcohol, and avoid fatty foods, chocolate, peppermint, colas, red wine, and acidic juices such as orange juice.  NO MINT OR MENTHOL PRODUCTS SO NO COUGH DROPS  USE SUGARLESS CANDY INSTEAD (Jolley ranchers or Stover's or Life Savers) or even ice chips will also do - the key is to swallow to prevent all throat clearing. NO OIL BASED VITAMINS - use powdered substitutes.  Avoid fish oil when coughing.    Please schedule a follow up office visit in 6 weeks, call sooner if needed with all medications /inhalers/ solutions in hand so we can verify exactly what you are taking. This includes all medications from all doctors and over the counters

## 2023-01-20 NOTE — Progress Notes (Signed)
Pamela Hatfield, female    DOB: [birth year]    MRN: [id number]   Brief patient profile:  72  yowf  quit smoking age 72  referred to pulmonary clinic in [place] 73/05/2023 by Dr Lorrene Reid  for unexplained cough/doe x slow walking at [employer]  x 2022-2023   Prior w/u at [facility] ? Asthma > inhaler helps sometimes    History of Present Illness: 01/20/2023  Pulmonary/ 1st office eval/ Lindi Abram / [place] [facility]  Chief Complaint  Patient presents with   Consult    Pt consult states that she has had SOB/DOE ongoing 1 yr. Occurs during exertion and when bending over  Dyspnea:  1-2 aisles at grocery  Cough: dry mostly at hs assoc with choking/ throat closing up  Sleep: 30-45 degrees since onset  SABA use: varies  02: none   No obvious day to day or daytime pattern/variability or assoc excess/ purulent sputum or mucus plugs or hemoptysis or cp or chest tightness, subjective wheeze or overt sinus or hb symptoms.     Also denies any obvious fluctuation of symptoms with weather or environmental changes or other aggravating or alleviating factors except as outlined above   No unusual exposure hx or h/o childhood pna/ asthma or knowledge of premature birth.  Current Allergies, Complete Past Medical History, Past Surgical History, Family History, and Social History were reviewed in [employer] record.  ROS  The following are not active complaints unless bolded Hoarseness, sore throat, dysphagia, dental problems, itching, sneezing,  nasal congestion or discharge of excess mucus or purulent secretions, ear ache,   fever, chills, sweats, unintended wt loss or wt gain, classically pleuritic or exertional cp,  orthopnea pnd or arm/hand swelling  or leg swelling, presyncope, palpitations, abdominal pain, anorexia, nausea, vomiting, diarrhea  or change in bowel habits or change in bladder habits, change in stools or change in urine, dysuria, hematuria,  rash, arthralgias, visual complaints,  headache, numbness, weakness or ataxia or problems with walking or coordination,  change in mood or  memory.                Past Medical History:  Diagnosis Date   Accessory spleen    Anxiety    Arthritis    multiple areas of her body   Asthma    Cancer (HCC)    located on her aorta and small intestines- on Octreodtide 10mg  IM every 28 days for this   Depression    Diabetes mellitus    diet controlled   Dyspnea    today 11/09/2017, reports that her breathing is normal for her    Dysrhythmia    hx rapid heart beat   Fibromyalgia    GERD (gastroesophageal reflux disease)    History of hiatal hernia    Hyperlipidemia    Hypertension    Myalgia and myositis, unspecified    Polyneuropathy    Sleep apnea    no cpap used, pt does not like, use to use CPAP but due to insurance had to return the device    Weight loss 02/25/2011    Outpatient Medications Prior to Visit  Medication Sig Dispense Refill   albuterol (VENTOLIN HFA) 108 (90 Base) MCG/ACT inhaler Inhale 2 puffs into the lungs every 6 (six) hours as needed for wheezing or shortness of breath.      buprenorphine (SUBUTEX) 2 MG SUBL SL tablet Place 2 mg under the tongue 4 (four) times daily.     cetirizine (  ZYRTEC) 10 MG tablet Take 10 mg by mouth daily as needed for allergies.      Cholecalciferol (VITAMIN D3 PO) Take 1 tablet by mouth daily.     clotrimazole-betamethasone (LOTRISONE) cream Apply to affected area 2 times daily until symptoms improve. (Patient taking differently: Apply 1 Application topically daily as needed (pain).) 45 g 0   DULoxetine (CYMBALTA) 60 MG capsule Take 60 mg by mouth daily.     enalapril (VASOTEC) 10 MG tablet Take 10 mg by mouth daily.      fluticasone (FLONASE) 50 MCG/ACT nasal spray Place 1-2 sprays into both nostrils daily as needed for allergies or rhinitis.     levETIRAcetam (KEPPRA) 750 MG tablet Take 750 mg by mouth 2 (two) times daily.     linaclotide (LINZESS) 145 MCG CAPS capsule  Take 145 mcg by mouth daily before breakfast.     meloxicam (MOBIC) 7.5 MG tablet Take 1 tablet (7.5 mg total) by mouth daily. 10 tablet 0   methocarbamol (ROBAXIN-750) 750 MG tablet Take 1 tablet (750 mg total) by mouth 4 (four) times daily. (Patient taking differently: Take 750 mg by mouth 3 (three) times daily.) 45 tablet 0   metoprolol (LOPRESSOR) 50 MG tablet Take 50 mg by mouth 2 (two) times daily.     montelukast (SINGULAIR) 10 MG tablet Take 10 mg by mouth at bedtime.      Multiple Vitamin (MULTIVITAMIN WITH MINERALS) TABS tablet Take 1 tablet by mouth daily.     Multiple Vitamins-Minerals (HAIR/SKIN/NAILS) CAPS Take 2 capsules by mouth daily.     NON FORMULARY Take 0.5 tablets by mouth daily as needed (pain). CBD Gummies     nystatin (MYCOSTATIN/NYSTOP) powder Apply 1 Application topically 3 (three) times daily. Apply 3 times daily until symptoms improve. 60 g 0   octreotide (SANDOSTATIN LAR) 10 MG injection Inject 10 mg into the muscle every 28 (twenty-eight) days.     pantoprazole (PROTONIX) 20 MG tablet Take 20 mg by mouth daily.     polyethylene glycol powder (GLYCOLAX/MIRALAX) 17 GM/SCOOP powder Take 17 g by mouth daily as needed for mild constipation or moderate constipation.     Sod Picosulfate-Mag Ox-Cit Acd (CLENPIQ) 10-3.5-12 MG-GM -GM/175ML SOLN Take 1 kit by mouth as directed. 350 mL 0   trolamine salicylate (BLUE-EMU HEMP) 10 % cream Apply 1 Application topically as needed for muscle pain.     No facility-administered medications prior to visit.     Objective:     BP (!) 147/78   Pulse 72   Ht 5\' 4"  (1.626 m)   Wt 163 lb 12.8 oz (74.3 kg)   SpO2 97% Comment: RA  BMI 28.12 kg/m   SpO2: 97 % (RA)  Somber amb wf nad   HEENT : Oropharynx  clear         NECK :  without  apparent JVD/ palpable Nodes/TM    LUNGS: no acc muscle use,  Nl contour chest which is clear to A and P bilaterally without cough on insp or exp maneuvers   CV:  RRR  no s3 or murmur or  increase in P2, and no edema   ABD:  soft and nontender with nl inspiratory excursion in the supine position. No bruits or organomegaly appreciated   MS:  Nl gait/ ext warm without deformities Or obvious joint restrictions  calf tenderness, cyanosis or clubbing    SKIN: warm and dry without lesions    NEURO:  alert, approp, nl sensorium with  no motor or cerebellar deficits apparent.      I personally reviewed images and agree with radiology impression as follows:  CXR:   Pa and Lateral 12/10/22 1. No acute cardiopulmonary disease. 2. Age-indeterminate moderate (approximately 50%) compression deformity involving the adjacent T11 vertebral body, not seen on recent thoracic spine CT performed 10/21/2022. Correlation for point tenderness at this location is advised. 3. Similar appearance of adjacent moderate (approximately 50%) compression deformity of the T12 vertebral body. Assessment   Upper airway cough syndrome Onset around 2022 with assso doe - 01/20/2023   Walked on RA  x  2  lap(s) =  approx 300  ft  @ slow pace, stopped due to sob with lowest 02 sats 95%  - try off ACEi  01/20/2023   Upper airway cough syndrome (previously labeled PNDS),  is so named because it's frequently impossible to sort out how much is  CR/sinusitis with freq throat clearing (which can be related to primary GERD)   vs  causing  secondary (" extra esophageal")  GERD from wide swings in gastric pressure that occur with throat clearing, often  promoting self use of mint and menthol lozenges that reduce the lower esophageal sphincter tone and exacerbate the problem further in a cyclical fashion.   These are the same pts (now being labeled as having "irritable larynx syndrome" by some cough centers) who not infrequently have a history of having failed to tolerate ace inhibitors,  dry powder inhalers or biphosphonates or report having atypical/extraesophageal reflux symptoms that don't respond to standard doses of PPI   and are easily confused as having aecopd or asthma flares by even experienced allergists/ pulmonologists (myself included).   Rec  Try off acei/ on gerd rx and regroup in 6 weeks   Essential hypertension Try off acei 01/20/2023 for upper airway cough syndrome  In the best review of chronic cough to date ( NEJM 2016 375 1610-9604) ,  ACEi are now felt to cause cough in up to  20% of pts which is a 4 fold increase from previous reports and does not include the variety of non-specific complaints we see in pulmonary clinic in pts on ACEi but previously attributed to another dx like  Copd/asthma and  include PNDS, throat and chest congestion, "bronchitis", unexplained dyspnea and noct "strangling"/choking sensations, and hoarseness, but also  atypical /refractory GERD symptoms like dysphagia and "bad heartburn"   The only way I know  to prove this is not an "ACEi Case" is a trial off ACEi x a minimum of 6 weeks then regroup.     >>> try valsartan 160 mg q d and f/u in 6 weeks  Discussed in detail all the  indications, usual  risks and alternatives  relative to the benefits with patient who agrees to proceed with Rx as outlined.      Each maintenance medication was reviewed in detail including emphasizing most importantly the difference between maintenance and prns and under what circumstances the prns are to be triggered using an action plan format where appropriate.  Total time for H and P, chart review, counseling,  directly observing portions of ambulatory 02 saturation study/ and generating customized AVS unique to this office visit / same day charting > 60 min new pt eval                         Sandrea Hughs, MD 01/20/2023

## 2023-01-21 DIAGNOSIS — R058 Other specified cough: Secondary | ICD-10-CM | POA: Insufficient documentation

## 2023-01-21 NOTE — Assessment & Plan Note (Signed)
Try off acei 01/20/2023 for upper airway cough syndrome  In the best review of chronic cough to date ( NEJM 2016 375 [id number]) ,  ACEi are now felt to cause cough in up to  20% of pts which is a 4 fold increase from previous reports and does not include the variety of non-specific complaints we see in pulmonary clinic in pts on ACEi but previously attributed to another dx like  Copd/asthma and  include PNDS, throat and chest congestion, "bronchitis", unexplained dyspnea and noct "strangling"/choking sensations, and hoarseness, but also  atypical /refractory GERD symptoms like dysphagia and "bad heartburn"   The only way I know  to prove this is not an "ACEi Case" is a trial off ACEi x a minimum of 6 weeks then regroup.     >>> try valsartan 160 mg q d and f/u in 6 weeks  Discussed in detail all the  indications, usual  risks and alternatives  relative to the benefits with patient who agrees to proceed with Rx as outlined.      Each maintenance medication was reviewed in detail including emphasizing most importantly the difference between maintenance and prns and under what circumstances the prns are to be triggered using an action plan format where appropriate.  Total time for H and P, chart review, counseling,  directly observing portions of ambulatory 02 saturation study/ and generating customized AVS unique to this office visit / same day charting > 60 min new pt eval

## 2023-01-21 NOTE — Assessment & Plan Note (Addendum)
Onset around 2022 with assso doe - 01/20/2023   Walked on RA  x  2  lap(s) =  approx 300  ft  @ slow pace, stopped due to sob with lowest 02 sats 95%  - try off ACEi  01/20/2023   Upper airway cough syndrome (previously labeled PNDS),  is so named because it's frequently impossible to sort out how much is  CR/sinusitis with freq throat clearing (which can be related to primary GERD)   vs  causing  secondary (" extra esophageal")  GERD from wide swings in gastric pressure that occur with throat clearing, often  promoting self use of mint and menthol lozenges that reduce the lower esophageal sphincter tone and exacerbate the problem further in a cyclical fashion.   These are the same pts (now being labeled as having "irritable larynx syndrome" by some cough centers) who not infrequently have a history of having failed to tolerate ace inhibitors,  dry powder inhalers or biphosphonates or report having atypical/extraesophageal reflux symptoms that don't respond to standard doses of PPI  and are easily confused as having aecopd or asthma flares by even experienced allergists/ pulmonologists (myself included).   Rec  Try off acei/ on gerd rx and regroup in 6 weeks

## 2023-01-24 ENCOUNTER — Encounter (HOSPITAL_COMMUNITY): Payer: Medicare HMO | Admitting: Physical Therapy

## 2023-01-27 ENCOUNTER — Encounter (HOSPITAL_COMMUNITY): Payer: Medicare HMO | Admitting: Physical Therapy

## 2023-01-31 ENCOUNTER — Encounter (HOSPITAL_COMMUNITY): Payer: Medicare HMO | Admitting: Physical Therapy

## 2023-02-01 ENCOUNTER — Ambulatory Visit (HOSPITAL_COMMUNITY)
Admission: RE | Admit: 2023-02-01 | Discharge: 2023-02-01 | Disposition: A | Discharge status: Home / Self Care | Payer: Medicare HMO | Source: Ambulatory Visit | Attending: Neurological Surgery | Admitting: Neurological Surgery

## 2023-02-01 DIAGNOSIS — S22080G Wedge compression fracture of T11-T12 vertebra, subsequent encounter for fracture with delayed healing: Secondary | ICD-10-CM | POA: Diagnosis present

## 2023-02-03 ENCOUNTER — Encounter (HOSPITAL_COMMUNITY): Payer: Medicare HMO | Admitting: Physical Therapy

## 2023-02-14 ENCOUNTER — Other Ambulatory Visit: Payer: Self-pay | Admitting: Otolaryngology

## 2023-02-15 ENCOUNTER — Encounter (HOSPITAL_COMMUNITY): Payer: Self-pay | Admitting: Otolaryngology

## 2023-02-15 NOTE — Anesthesia Preprocedure Evaluation (Signed)
Anesthesia Evaluation  Patient identified by MRN, date of birth, ID band Patient awake    Reviewed: Allergy & Precautions, NPO status , Patient's Chart, lab work & pertinent test results, reviewed documented beta blocker date and time   History of Anesthesia Complications Negative for: history of anesthetic complications  Airway Mallampati: IV  TM Distance: >3 FB Neck ROM: Limited    Dental  (+) Dental Advisory Given, Teeth Intact   Pulmonary asthma , sleep apnea , former smoker   Pulmonary exam normal breath sounds clear to auscultation       Cardiovascular hypertension, Pt. on medications and Pt. on home beta blockers + dysrhythmias  Rhythm:Regular Rate:Normal  Echo 01/2022  1. Left ventricular ejection fraction, by estimation, is 60 to 65%. The left ventricle has normal function. The left ventricle has no regional wall motion abnormalities. Left ventricular diastolic parameters are consistent with Grade I diastolic dysfunction (impaired relaxation).   2. Right ventricular systolic function is normal. The right ventricular size is normal. Tricuspid regurgitation signal is inadequate for assessing PA pressure.   3. The mitral valve is normal in structure. No evidence of mitral valve regurgitation. No evidence of mitral stenosis.   4. The aortic valve was not well visualized. Aortic valve regurgitation is trivial. No aortic stenosis is present.   5. The inferior vena cava is normal in size with greater than 50% respiratory variability, suggesting right atrial pressure of 3 mmHg.     Neuro/Psych  Headaches PSYCHIATRIC DISORDERS Anxiety Depression     Neuromuscular disease    GI/Hepatic Neg liver ROS, hiatal hernia,GERD  Medicated,,  Endo/Other  diabetes, Well Controlled, Type 2    Renal/GU negative Renal ROS     Musculoskeletal  (+) Arthritis ,  Fibromyalgia -  Abdominal   Peds  Hematology negative hematology ROS (+)    Anesthesia Other Findings Neuroendocrine/carcinoid tumor of ileum  Reproductive/Obstetrics                             Anesthesia Physical Anesthesia Plan  ASA: 3  Anesthesia Plan: MAC   Post-op Pain Management: Minimal or no pain anticipated   Induction:   PONV Risk Score and Plan: 2 and Propofol infusion and Treatment may vary due to age or medical condition  Airway Management Planned: Nasal Cannula and Natural Airway  Additional Equipment: None  Intra-op Plan:   Post-operative Plan:   Informed Consent: I have reviewed the patients History and Physical, chart, labs and discussed the procedure including the risks, benefits and alternatives for the proposed anesthesia with the patient or authorized representative who has indicated his/her understanding and acceptance.     Dental advisory given  Plan Discussed with: CRNA  Anesthesia Plan Comments: (See PAT note)       Anesthesia Quick Evaluation

## 2023-02-15 NOTE — Progress Notes (Signed)
Ms Boccuzzi denies chest pain or shortness of breath.  Patient denies having any s/s of Covid in her household, also denies any known exposure to Covid.  Ms Perina states that she had a upper respiratory over 4 weeks ago, patient was given antibiotics and has been without symptoms for > 2 weeks.  Ms Cheyne has type II diabetes, she is diet controlled, patient checks CBG , I instructed patient to check CBG in am when she awakens and every 2 hours  until she leaves to come to the hospital. If CBG is < than 70 , drink 4 oz of apple or cranberry juice, check CBG 15 minutes later and call the pre surgery desk.  Ms Mazur's PCP is Dr. Elesa Massed, pulmonologist is Dr. Sherene Sires, cardiologist is Dr. Carmina Miller.

## 2023-02-15 NOTE — Progress Notes (Signed)
Anesthesia Chart Review: SAME DAY WORK-UP   Case: [id number] Date/Time: 02/16/23 1552   Procedure: DRUG INDUCED SLEEP  ENDOSCOPY (Bilateral)   Anesthesia type: General   Pre-op diagnosis:      Obstructive Sleep Apnea     Obstructive sleep apnea adult pediatric     BMI 27.0-27.9,adult   [facility]OM 09 / [facility]   Surgeons: Christia Reading, MD       DISCUSSION: Patient is a 72 year old female scheduled for the above procedure. Severe OSA with AHI 81 by sleep study in 10/2022 and has been intolerant to CPAP masks.    History includes former smoker (quit 09/13/92), HTN, OSA, asthma, DM2, distal ileum carcinoid tumor, spinal surgery (C4-7 ACDF 11/16/17; C3-4 ACDF, removal of C4-7 anterior plate 12/14/45).   She is followed by oncologist Dr. 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 Per 12/28/22 note, she was diagnosed in 2014. He wrote, "Unfortunately, due to the location of the mass, it is not amenable to a percutaneous or a laparoscopic biopsy. Patient continues to refuse surgery to obtain tissue specimen for confirmation of diagnosis; she was started on monthly Octreotide on 06/05/13." Due to overall stability of the tumor, she has opted not to pursue surgical resection as of yet. CT 12/28/22 showed stable mass. S/p Sandostatin injection 01/25/23. 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 She is also followed by GI for submucosal gastric lesion with previous negative biopsies.    Neurosurgeon Dr. Yetta Barre is following her for T12 compression fracture following a fall. Last visit 01/04/23. He ordered a MRI L-spine which was done on 02/01/23 and showed acute to subacute L1 compression fracture with approximately 40% loss of vertebral body height, new or progressed since 10/21/22 and chronic T12 compression fracture.   Anesthesia team to evaluate on the day of procedure.  Lab results on 01/25/23 include sodium 127, potassium 4.2, glucose 134, BUN 11, creatinine 0.88, alkaline phosphatase 163, AST 22, ALT 13, calcium 8.2, WBC 9.0, hemoglobin 12.5, hematocrit 38.2, platelet count 382 (see Care Everywhere).   VS:  BP Readings from Last 3 Encounters:  01/20/23 (!) 147/78  10/21/22 (!) 163/76  08/26/22 139/81   Pulse Readings from Last 3 Encounters:  01/20/23 72  10/21/22 62  08/26/22 73    PROVIDERS: Combs, Prince Solian, DO is PCP  Alverda Skeans, MD is cardiologist. Seen on 01/13/22 for chest pain and dyspnea. CTA did not show obstructive CAD, echo reassuring. Medical management with stati and ASA recommended  Sunny Schlein, MD is ONC Despina Arias, MD is neurologist Eula Listen, MD is GI Sandrea Hughs, MD is pulmonologist Marikay Alar, MD is neurosurgeon   LABS: She had CMP and CBC with diff on 01/25/23 at [facility]. See Care Everywhere. See DISCUSSION.    OTHER:  Walk Test 01/20/23: Walked on RA  x  2  lap(s) =  approx 300  ft  @ slow pace, stopped due to sob with lowest 02 sats 95%. (Try off ACEi  01/20/23).   Sleep Study 11/02/22: IMPRESSIONS - Severe obstructive sleep apnea with an AHI of 81 and SpO2 low of 82%. - The patient snored with loud snoring volume. - EKG findings include PVCs. - Severe periodic limb movements of sleep occurred during the study. Associated arousals were significant.    IMAGES: MRI L-spine 02/01/23 (ordered by Marikay Alar, MD): IMPRESSION: 1.  Transitional lumbosacral anatomy with 6 non-rib-bearing lumbar vertebral bodies. For the purposes of this report, the lowest fully formed disc space is designated L6-S1. Careful correlation with this numbering convention is recommended if any intervention is planned. 2. Acute to subacute compression fracture of the L1 vertebral body with approximately 40% loss of vertebral body height, new/progressed since the prior CT from 10/21/2022 with marrow edema and a fluid cleft  along the superior endplate. There is approximately 3 mm bony retropulsion which is new since the prior CT resulting in mild spinal canal stenosis. 3. Chronic compression deformity of the T12 vertebral body with approximately 50% loss of vertebral body height anteriorly, unchanged since 10/21/2022. 4. At L4-L5, there is mild spinal canal and bilateral subarticular zone narrowing and mild-to-moderate bilateral neural foraminal stenosis. 5. Suspected prior right laminectomy at L5-L6. Mild-to-moderate left neural foraminal stenosis, and right subarticular zone narrowing with possible impingement of the traversing nerve root at this level.    CT Abd/pelvis 12/28/22 ([facility]): IMPRESSION:  1.  Stable appearance of the dominant central mesenteric mass with adjacent nodules.  2.  Age indeterminant T12 superior endplate compression deformity with endplate sclerosis, disc vacuum phenomena and retropulsion, likely degenerative. If clinical concern for osteomyelitis, consider spine MRI.  3.  Additional stable chronic/ancillary findings as described.     EKG 01/13/22: Sinus rhythm with nonspecific ST and T wave changes.     CV: Echo 02/01/22:  1. Left ventricular ejection fraction, by estimation, is 60 to 65%. The left ventricle has normal function. The left ventricle has no regional wall motion abnormalities. Left ventricular diastolic parameters are consistent with Grade I diastolic dysfunction (impaired relaxation).  2. Right ventricular systolic function is normal. The right ventricular size is normal. Tricuspid regurgitation signal is inadequate for assessing PA pressure.  3. The mitral valve is normal in structure. No evidence of mitral valve regurgitation. No evidence of mitral stenosis.  4. The aortic valve was not well visualized. Aortic valve regurgitation is trivial. No aortic stenosis is present. 5. The inferior vena cava is normal in size with greater than 50% respiratory variability,  suggesting right atrial pressure of 3 mmHg.    Coronary CT 01/28/22:  1. Coronary calcium score of 10 this was 41st percentile for age, sex, and race matched control. 2. Normal coronary origin with left dominance. 3. Dilation of the main pulmonary artery, moderate, 32 mm. This can be associated with the presence of pulmonary hypertension; clinical correlation advised. 4. Aortic atherosclerosis. 5. CAD-RADS 1. Minimal non-obstructive CAD (1-24%). Consider non-atherosclerotic causes of chest pain. Consider preventive therapy and risk factor modification.   Cardiac event monitor 05/12/16 - 05/25/16: NSR  Cardiac cath 08/25/09: Impression: This man has normal coronary arteries and normal left ventricular function.  Past Medical History:  Diagnosis Date   Accessory spleen    Anxiety    Arthritis    multiple areas of her body   Asthma    Cancer (HCC)    located on her aorta and small intestines- on Octreodtide 10mg  IM every 28 days for this   Depression    Diabetes mellitus    diet controlled   Dyspnea    today 11/09/2017, reports that her breathing is normal for her    Dysrhythmia    hx rapid heart beat   Fibromyalgia    GERD (gastroesophageal reflux disease)    History of hiatal hernia    Hyperlipidemia    Hypertension    Myalgia and myositis, unspecified  Polyneuropathy    Sleep apnea    no cpap used, pt does not like, use to use CPAP but due to insurance had to return the device    Weight loss 02/25/2011    Past Surgical History:  Procedure Laterality Date   ABDOMINAL HYSTERECTOMY  yrs ago   ovaries remain, done because of endometriosis   ANTERIOR CERVICAL DECOMP/DISCECTOMY FUSION N/A 11/16/2017   Procedure: Anterior Cervical Decompression/Discectomy Fusion - Cervical four-Cervical five - Cervical five-Cervical six - Cervical six-Cervical seven;  Surgeon: Tia Alert, MD;  Location: [facility] OR;  Service: Neurosurgery;  Laterality: N/A;   ANTERIOR CERVICAL DECOMP/DISCECTOMY  FUSION N/A 04/05/2022   Procedure: Anterior Cervical Discectomy and Fusion Cervical Three-Four, Removal of Plate Cervical Four-Five;  Surgeon: Tia Alert, MD;  Location: [facility] OR;  Service: Neurosurgery;  Laterality: N/A;  3C   BACK SURGERY  yrs ago   lower back   BIOPSY  08/18/2022   Procedure: BIOPSY;  Surgeon: Corbin Ade, MD;  Location: [facility];  Service: Endoscopy;;   BREAST BIOPSY Bilateral    benign biopsies   BREAST LUMPECTOMY     both breast   CARDIAC CATHETERIZATION     CARPAL TUNNEL RELEASE Right 2003   CARPAL TUNNEL RELEASE Left 02/12/2020   Procedure: CARPAL TUNNEL RELEASE;  Surgeon: Vickki Hearing, MD;  Location: [facility] ORS;  Service: Orthopedics;  Laterality: Left;   CHOLECYSTECTOMY  yrs ago   COLONOSCOPY  06/2012   normal   COLONOSCOPY WITH PROPOFOL N/A 08/18/2022   Procedure: COLONOSCOPY WITH PROPOFOL;  Surgeon: Corbin Ade, MD;  Location: [facility];  Service: Endoscopy;  Laterality: N/A;  10:00am, asa 3   ESOPHAGEAL DILATION  06/14/2012   Procedure: ESOPHAGEAL DILATION;  Surgeon: Corbin Ade, MD;  Location: [facility];  Service: Endoscopy;;   EUS  07/13/2012   Procedure: UPPER ENDOSCOPIC ULTRASOUND (EUS) LINEAR;  Surgeon: Rachael Fee, MD;  Location: [facility];  Service: Endoscopy;  Laterality: N/A;   FLEXIBLE SIGMOIDOSCOPY N/A 03/24/2022   Procedure: FLEXIBLE SIGMOIDOSCOPY;  Surgeon: Corbin Ade, MD;  Location: [facility];  Service: Endoscopy;  Laterality: N/A;   NOSE SURGERY  yrs ago   ROTATOR CUFF REPAIR  yrs ago   left    MEDICATIONS: No current facility-administered medications for this encounter.    baclofen (LIORESAL) 10 MG tablet   buprenorphine (SUBUTEX) 2 MG SUBL SL tablet   cetirizine (ZYRTEC) 10 MG tablet   Charcoal Activated (ACTIVATED CHARCOAL PO)   clotrimazole-betamethasone (LOTRISONE) cream   Dextromethorphan-guaiFENesin (MUCINEX DM MAXIMUM STRENGTH) 60-1200 MG TB12   DULoxetine (CYMBALTA) 60 MG capsule    estradiol (ESTRACE) 0.1 MG/GM vaginal cream   famotidine (PEPCID) 20 MG tablet   fluticasone (FLONASE) 50 MCG/ACT nasal spray   linaclotide (LINZESS) 145 MCG CAPS capsule   meloxicam (MOBIC) 7.5 MG tablet   methocarbamol (ROBAXIN-750) 750 MG tablet   metoprolol (LOPRESSOR) 50 MG tablet   montelukast (SINGULAIR) 10 MG tablet   Multiple Minerals-Vitamins (CAL-MAG-ZINC-D PO)   Multiple Vitamin (MULTIVITAMIN WITH MINERALS) TABS tablet   Multiple Vitamins-Minerals (HAIR/SKIN/NAILS) CAPS   naloxone (NARCAN) 2 MG/2ML injection   NON FORMULARY   nystatin (MYCOSTATIN/NYSTOP) powder   octreotide (SANDOSTATIN LAR) 10 MG injection   pantoprazole (PROTONIX) 20 MG tablet   polyethylene glycol powder (GLYCOLAX/MIRALAX) 17 GM/SCOOP powder   PROLIA 60 MG/ML SOSY injection   trolamine salicylate (BLUE-EMU HEMP) 10 % cream   valsartan (DIOVAN) 160 MG tablet   Sod  Picosulfate-Mag Ox-Cit Acd (CLENPIQ) 10-3.5-12 MG-GM -GM/175ML SOLN    Shonna Chock, PA-C Surgical Short Stay/Anesthesiology [facility] Phone [phone] [facility] Phone [phone] 02/15/2023 2:17 PM

## 2023-02-16 ENCOUNTER — Ambulatory Visit (HOSPITAL_BASED_OUTPATIENT_CLINIC_OR_DEPARTMENT_OTHER): Payer: Medicare HMO | Admitting: Vascular Surgery

## 2023-02-16 ENCOUNTER — Encounter (HOSPITAL_COMMUNITY): Payer: Self-pay | Admitting: Otolaryngology

## 2023-02-16 ENCOUNTER — Ambulatory Visit (HOSPITAL_COMMUNITY)
Admission: RE | Admit: 2023-02-16 | Discharge: 2023-02-16 | Disposition: A | Discharge status: Home / Self Care | Payer: Medicare HMO | Source: Ambulatory Visit | Attending: Otolaryngology | Admitting: Otolaryngology

## 2023-02-16 ENCOUNTER — Ambulatory Visit (HOSPITAL_COMMUNITY): Payer: Medicare HMO | Admitting: Vascular Surgery

## 2023-02-16 ENCOUNTER — Encounter (HOSPITAL_COMMUNITY)
Admission: RE | Disposition: A | Discharge status: Home / Self Care | Payer: Self-pay | Source: Ambulatory Visit | Attending: Otolaryngology

## 2023-02-16 ENCOUNTER — Other Ambulatory Visit: Payer: Self-pay

## 2023-02-16 DIAGNOSIS — I10 Essential (primary) hypertension: Secondary | ICD-10-CM | POA: Diagnosis not present

## 2023-02-16 DIAGNOSIS — J45909 Unspecified asthma, uncomplicated: Secondary | ICD-10-CM

## 2023-02-16 DIAGNOSIS — E119 Type 2 diabetes mellitus without complications: Secondary | ICD-10-CM | POA: Insufficient documentation

## 2023-02-16 DIAGNOSIS — Z87891 Personal history of nicotine dependence: Secondary | ICD-10-CM

## 2023-02-16 DIAGNOSIS — F418 Other specified anxiety disorders: Secondary | ICD-10-CM | POA: Diagnosis not present

## 2023-02-16 DIAGNOSIS — G4733 Obstructive sleep apnea (adult) (pediatric): Secondary | ICD-10-CM

## 2023-02-16 DIAGNOSIS — M797 Fibromyalgia: Secondary | ICD-10-CM | POA: Diagnosis not present

## 2023-02-16 DIAGNOSIS — Z79899 Other long term (current) drug therapy: Secondary | ICD-10-CM | POA: Diagnosis not present

## 2023-02-16 DIAGNOSIS — K219 Gastro-esophageal reflux disease without esophagitis: Secondary | ICD-10-CM | POA: Insufficient documentation

## 2023-02-16 DIAGNOSIS — E663 Overweight: Secondary | ICD-10-CM | POA: Diagnosis not present

## 2023-02-16 DIAGNOSIS — Z6827 Body mass index (BMI) 27.0-27.9, adult: Secondary | ICD-10-CM | POA: Insufficient documentation

## 2023-02-16 DIAGNOSIS — M1990 Unspecified osteoarthritis, unspecified site: Secondary | ICD-10-CM | POA: Diagnosis not present

## 2023-02-16 HISTORY — DX: Chronic cluster headache, not intractable: G44.029

## 2023-02-16 HISTORY — PX: DRUG INDUCED ENDOSCOPY: SHX6808

## 2023-02-16 LAB — CBC
HCT: 44.3 % (ref 36.0–46.0)
Hemoglobin: 13.8 g/dL (ref 12.0–15.0)
MCH: 28.8 pg (ref 26.0–34.0)
MCHC: 31.2 g/dL (ref 30.0–36.0)
MCV: 92.5 fL (ref 80.0–100.0)
Platelets: 396 10*3/uL (ref 150–400)
RBC: 4.79 MIL/uL (ref 3.87–5.11)
RDW: 13.8 % (ref 11.5–15.5)
WBC: 14 10*3/uL — ABNORMAL HIGH (ref 4.0–10.5)
nRBC: 0 % (ref 0.0–0.2)

## 2023-02-16 LAB — POCT I-STAT, CHEM 8
BUN: 17 mg/dL (ref 8–23)
Calcium, Ion: 1.06 mmol/L — ABNORMAL LOW (ref 1.15–1.40)
Chloride: 99 mmol/L (ref 98–111)
Creatinine, Ser: 0.9 mg/dL (ref 0.44–1.00)
Glucose, Bld: 118 mg/dL — ABNORMAL HIGH (ref 70–99)
HCT: 42 % (ref 36.0–46.0)
Hemoglobin: 14.3 g/dL (ref 12.0–15.0)
Potassium: 4.6 mmol/L (ref 3.5–5.1)
Sodium: 138 mmol/L (ref 135–145)
TCO2: 32 mmol/L (ref 22–32)

## 2023-02-16 LAB — GLUCOSE, CAPILLARY
Glucose-Capillary: 113 mg/dL — ABNORMAL HIGH (ref 70–99)
Glucose-Capillary: 96 mg/dL (ref 70–99)

## 2023-02-16 SURGERY — DRUG INDUCED SLEEP ENDOSCOPY
Anesthesia: Monitor Anesthesia Care | Laterality: Bilateral

## 2023-02-16 MED ORDER — ORAL CARE MOUTH RINSE: 15.0000 mL | Freq: Once | OROMUCOSAL | Status: AC

## 2023-02-16 MED ORDER — CHLORHEXIDINE GLUCONATE 0.12 % MT SOLN
15.0000 mL | Freq: Once | OROMUCOSAL | Status: AC
  Administered 2023-02-16: 15 mL via OROMUCOSAL
  Filled 2023-02-16: qty 15

## 2023-02-16 MED ORDER — LIDOCAINE 2% (20 MG/ML) 5 ML SYRINGE
INTRAMUSCULAR | Status: DC | PRN
  Administered 2023-02-16: 40 mg via INTRAVENOUS

## 2023-02-16 MED ORDER — OXYMETAZOLINE HCL 0.05 % NA SOLN
NASAL | Status: DC | PRN
  Administered 2023-02-16: 1 via TOPICAL

## 2023-02-16 MED ORDER — INSULIN ASPART 100 UNIT/ML IJ SOLN: 0.0000 [IU] | INTRAMUSCULAR | Status: DC | PRN

## 2023-02-16 MED ORDER — PROPOFOL 10 MG/ML IV BOLUS
INTRAVENOUS | Status: DC | PRN
  Administered 2023-02-16 (×4): 15 mg via INTRAVENOUS

## 2023-02-16 MED ORDER — OXYMETAZOLINE HCL 0.05 % NA SOLN
NASAL | Status: AC
  Filled 2023-02-16: qty 30

## 2023-02-16 MED ORDER — PROPOFOL 500 MG/50ML IV EMUL
INTRAVENOUS | Status: DC | PRN
  Administered 2023-02-16: 50 ug/kg/min via INTRAVENOUS

## 2023-02-16 MED ORDER — LACTATED RINGERS IV SOLN: INTRAVENOUS | Status: DC

## 2023-02-16 SURGICAL SUPPLY — 13 items
BAG COUNTER SPONGE SURGICOUNT (BAG) IMPLANT
BAG SPNG CNTER NS LX DISP (BAG)
CANISTER SUCT 1200ML W/VALVE (MISCELLANEOUS) ×2 IMPLANT
GLOVE BIO SURGEON STRL SZ7.5 (GLOVE) ×2 IMPLANT
KIT BASIN OR (CUSTOM PROCEDURE TRAY) ×2 IMPLANT
NDL PRECISIONGLIDE 27X1.5 (NEEDLE) IMPLANT
NEEDLE PRECISIONGLIDE 27X1.5 (NEEDLE) IMPLANT
PATTIES SURGICAL .5 X3 (DISPOSABLE) ×2 IMPLANT
SHEET MEDIUM DRAPE 40X70 STRL (DRAPES) IMPLANT
SOL ANTI FOG 6CC (MISCELLANEOUS) ×2 IMPLANT
SYR CONTROL 10ML LL (SYRINGE) IMPLANT
TOWEL GREEN STERILE FF (TOWEL DISPOSABLE) ×2 IMPLANT
TUBE CONNECTING 20X1/4 (TUBING) ×2 IMPLANT

## 2023-02-16 NOTE — Anesthesia Procedure Notes (Signed)
Procedure Name: MAC Date/Time: 02/16/2023 5:20 PM  Performed by: Aundria Rud, CRNAPre-anesthesia Checklist: Patient identified, Emergency Drugs available, Suction available and Patient being monitored Patient Re-evaluated:Patient Re-evaluated prior to induction Oxygen Delivery Method: Simple face mask Preoxygenation: Pre-oxygenation with 100% oxygen Induction Type: IV induction Placement Confirmation: positive ETCO2 and CO2 detector Dental Injury: Teeth and Oropharynx as per pre-operative assessment

## 2023-02-16 NOTE — H&P (Signed)
Pamela Hatfield is an 72 y.o. female.   Chief Complaint: Sleep apnea HPI: 72 year old female with sleep apnea who has been unable to tolerate CPAP.  Past Medical History:  Diagnosis Date   Accessory spleen    Anxiety    Arthritis    multiple areas of her body   Asthma    Cancer (HCC)    located on her aorta and small intestines- on Octreodtide 10mg  IM every 28 days for this   Chronic cluster headache    Depression    Diabetes mellitus    diet controlled   Dyspnea    today 11/09/2017, reports that her breathing is normal for her    Dysrhythmia    hx rapid heart beat   Fibromyalgia    GERD (gastroesophageal reflux disease)    History of hiatal hernia    Hyperlipidemia    Hypertension    Myalgia and myositis, unspecified    Polyneuropathy    Sleep apnea    no cpap used, pt does not like, use to use CPAP but due to insurance had to return the device    Weight loss 02/25/2011    Past Surgical History:  Procedure Laterality Date   ABDOMINAL HYSTERECTOMY  yrs ago   ovaries remain, done because of endometriosis   ANTERIOR CERVICAL DECOMP/DISCECTOMY FUSION N/A 11/16/2017   Procedure: Anterior Cervical Decompression/Discectomy Fusion - Cervical four-Cervical five - Cervical five-Cervical six - Cervical six-Cervical seven;  Surgeon: Tia Alert, MD;  Location: [facility] OR;  Service: Neurosurgery;  Laterality: N/A;   ANTERIOR CERVICAL DECOMP/DISCECTOMY FUSION N/A 04/05/2022   Procedure: Anterior Cervical Discectomy and Fusion Cervical Three-Four, Removal of Plate Cervical Four-Five;  Surgeon: Tia Alert, MD;  Location: [facility] OR;  Service: Neurosurgery;  Laterality: N/A;  3C   BACK SURGERY  yrs ago   lower back   BIOPSY  08/18/2022   Procedure: BIOPSY;  Surgeon: Corbin Ade, MD;  Location: [facility];  Service: Endoscopy;;   BREAST BIOPSY Bilateral    benign biopsies   BREAST LUMPECTOMY     both breast   CARDIAC CATHETERIZATION     CARPAL TUNNEL RELEASE Right 2003   CARPAL  TUNNEL RELEASE Left 02/12/2020   Procedure: CARPAL TUNNEL RELEASE;  Surgeon: Vickki Hearing, MD;  Location: [facility] ORS;  Service: Orthopedics;  Laterality: Left;   CHOLECYSTECTOMY  yrs ago   COLONOSCOPY  06/2012   normal   COLONOSCOPY WITH PROPOFOL N/A 08/18/2022   Procedure: COLONOSCOPY WITH PROPOFOL;  Surgeon: Corbin Ade, MD;  Location: [facility];  Service: Endoscopy;  Laterality: N/A;  10:00am, asa 3   ESOPHAGEAL DILATION  06/14/2012   Procedure: ESOPHAGEAL DILATION;  Surgeon: Corbin Ade, MD;  Location: [facility];  Service: Endoscopy;;   EUS  07/13/2012   Procedure: UPPER ENDOSCOPIC ULTRASOUND (EUS) LINEAR;  Surgeon: Rachael Fee, MD;  Location: [facility];  Service: Endoscopy;  Laterality: N/A;   FLEXIBLE SIGMOIDOSCOPY N/A 03/24/2022   Procedure: FLEXIBLE SIGMOIDOSCOPY;  Surgeon: Corbin Ade, MD;  Location: [facility];  Service: Endoscopy;  Laterality: N/A;   NOSE SURGERY  yrs ago   ROTATOR CUFF REPAIR  yrs ago   left    Family History  Problem Relation Age of Onset   Cancer Mother        died age 28, ?not sure what kind, metastatic at time of diagnosis   Colon cancer Father    Colon cancer Maternal Aunt  Colon cancer Maternal Uncle    Social History:  reports that she quit smoking about 30 years ago. Her smoking use included cigarettes. She has a 10.00 pack-year smoking history. She has never used smokeless tobacco. She reports that she does not drink alcohol and does not use drugs.  Allergies:  Allergies  Allergen Reactions   Nickel Swelling   Sulfa Antibiotics Hives   Clarithromycin Rash   Codeine Itching and Other (See Comments)    Headaches   Lamotrigine Rash   Tape Itching    Medications Prior to Admission  Medication Sig Dispense Refill   baclofen (LIORESAL) 10 MG tablet Take 10 mg by mouth daily as needed for muscle spasms.     buprenorphine (SUBUTEX) 2 MG SUBL SL tablet Place 2 mg under the tongue 4 (four) times daily.      cetirizine (ZYRTEC) 10 MG tablet Take 10 mg by mouth daily as needed for allergies.      Charcoal Activated (ACTIVATED CHARCOAL PO) Take 520 mg by mouth every other day.     clotrimazole-betamethasone (LOTRISONE) cream Apply to affected area 2 times daily until symptoms improve. (Patient taking differently: Apply 1 Application topically daily as needed (pain).) 45 g 0   Dextromethorphan-guaiFENesin (MUCINEX DM MAXIMUM STRENGTH) 60-1200 MG TB12 Take 1 tablet by mouth daily.     DULoxetine (CYMBALTA) 60 MG capsule Take 60 mg by mouth daily.     estradiol (ESTRACE) 0.1 MG/GM vaginal cream Place 1 Applicatorful vaginally every other day.     famotidine (PEPCID) 20 MG tablet Take 20 mg by mouth daily.     fluticasone (FLONASE) 50 MCG/ACT nasal spray Place 2 sprays into both nostrils daily.     linaclotide (LINZESS) 145 MCG CAPS capsule Take 145 mcg by mouth daily before breakfast.     meloxicam (MOBIC) 7.5 MG tablet Take 1 tablet (7.5 mg total) by mouth daily. (Patient taking differently: Take 7.5 mg by mouth daily as needed for pain.) 10 tablet 0   methocarbamol (ROBAXIN-750) 750 MG tablet Take 1 tablet (750 mg total) by mouth 4 (four) times daily. (Patient taking differently: Take 750 mg by mouth 3 (three) times daily as needed for muscle spasms.) 45 tablet 0   metoprolol (LOPRESSOR) 50 MG tablet Take 50 mg by mouth 2 (two) times daily.     montelukast (SINGULAIR) 10 MG tablet Take 10 mg by mouth at bedtime.      Multiple Minerals-Vitamins (CAL-MAG-ZINC-D PO) Take 1 tablet by mouth daily.     Multiple Vitamin (MULTIVITAMIN WITH MINERALS) TABS tablet Take 1 tablet by mouth daily.     Multiple Vitamins-Minerals (HAIR/SKIN/NAILS) CAPS Take 2 capsules by mouth daily.     naloxone (NARCAN) 2 MG/2ML injection Inject 1 mg into the muscle once.     NON FORMULARY Take 0.5 tablets by mouth daily as needed (pain). CBD Gummies     nystatin (MYCOSTATIN/NYSTOP) powder Apply 1 Application topically 3 (three) times  daily. Apply 3 times daily until symptoms improve. (Patient taking differently: Apply 1 Application topically 3 (three) times daily as needed (Break out). until symptoms improve.) 60 g 0   octreotide (SANDOSTATIN LAR) 10 MG injection Inject 10 mg into the muscle every 28 (twenty-eight) days.     pantoprazole (PROTONIX) 20 MG tablet Take 20 mg by mouth daily.     polyethylene glycol powder (GLYCOLAX/MIRALAX) 17 GM/SCOOP powder Take 17 g by mouth daily as needed for mild constipation or moderate constipation.     trolamine salicylate (  BLUE-EMU HEMP) 10 % cream Apply 1 Application topically as needed for muscle pain.     valsartan (DIOVAN) 160 MG tablet Take 1 tablet (160 mg total) by mouth daily. 30 tablet 11   PROLIA 60 MG/ML SOSY injection Inject 60 mg into the skin every 6 (six) months.     Sod Picosulfate-Mag Ox-Cit Acd (CLENPIQ) 10-3.5-12 MG-GM -GM/175ML SOLN Take 1 kit by mouth as directed. 350 mL 0    Results for orders placed or performed during the hospital encounter of 02/16/23 (from the past 48 hour(s))  Glucose, capillary     Status: Abnormal   Collection Time: 02/16/23  1:39 PM  Result Value Ref Range   Glucose-Capillary 113 (H) 70 - 99 mg/dL    Comment: Glucose reference range applies only to samples taken after fasting for at least 8 hours.  CBC per protocol     Status: Abnormal   Collection Time: 02/16/23  2:02 PM  Result Value Ref Range   WBC 14.0 (H) 4.0 - 10.5 K/uL   RBC 4.79 3.87 - 5.11 MIL/uL   Hemoglobin 13.8 12.0 - 15.0 g/dL   HCT 40.9 81.1 - 91.4 %   MCV 92.5 80.0 - 100.0 fL   MCH 28.8 26.0 - 34.0 pg   MCHC 31.2 30.0 - 36.0 g/dL   RDW 78.2 95.6 - 21.3 %   Platelets 396 150 - 400 K/uL   nRBC 0.0 0.0 - 0.2 %    Comment: Performed at [facility], [address], [place], [place] [address]  I-STAT, [place] [address]     Status: Abnormal   Collection Time: 02/16/23  3:19 PM  Result Value Ref Range   Sodium 138 135 - 145 mmol/L   Potassium 4.6 3.5 - 5.1 mmol/L    Chloride 99 98 - 111 mmol/L   BUN 17 8 - 23 mg/dL   Creatinine, Ser 8.46 0.44 - 1.00 mg/dL   Glucose, Bld 962 (H) 70 - 99 mg/dL    Comment: Glucose reference range applies only to samples taken after fasting for at least 8 hours.   Calcium, Ion 1.06 (L) 1.15 - 1.40 mmol/L   TCO2 32 22 - 32 mmol/L   Hemoglobin 14.3 12.0 - 15.0 g/dL   HCT 95.2 84.1 - 32.4 %   No results found.  Review of Systems  All other systems reviewed and are negative.   Blood pressure (!) 177/81, pulse 61, temperature 98.2 F (36.8 C), temperature source Oral, resp. rate 20, height 5\' 4"  (1.626 m), weight 73.6 kg, SpO2 97 %. Physical Exam Constitutional:      Appearance: Normal appearance. She is normal weight.  HENT:     Head: Normocephalic and atraumatic.     Right Ear: External ear normal.     Left Ear: External ear normal.     Nose: Nose normal.     Mouth/Throat:     Mouth: Mucous membranes are moist.     Pharynx: Oropharynx is clear.  Eyes:     Extraocular Movements: Extraocular movements intact.     Conjunctiva/sclera: Conjunctivae normal.     Pupils: Pupils are equal, round, and reactive to light.  Cardiovascular:     Rate and Rhythm: Normal rate.  Pulmonary:     Effort: Pulmonary effort is normal.  Musculoskeletal:     Cervical back: Normal range of motion.  Neurological:     General: No focal deficit present.     Mental Status: She is alert and oriented  to person, place, and time.  Psychiatric:        Mood and Affect: Mood normal.        Behavior: Behavior normal.        Thought Content: Thought content normal.        Judgment: Judgment normal.      Assessment/Plan Obstructive sleep apnea and BMI 27.84  To OR for sleep endoscopy.  Christia Reading, MD 02/16/2023, 3:31 PM

## 2023-02-16 NOTE — Brief Op Note (Signed)
02/16/2023  5:32 PM  PATIENT:  Pamela Hatfield  72 y.o. female  PRE-OPERATIVE DIAGNOSIS:  Obstructive Sleep Apnea Obstructive sleep apnea adult pediatric BMI 27.0-27.9,adult  POST-OPERATIVE DIAGNOSIS:  Obstructive Sleep ApneaObstructive sleep apnea adult pediatricBMI 27.0-27.9,adult  PROCEDURE:  Procedure(s): DRUG INDUCED SLEEP  ENDOSCOPY (Bilateral)  SURGEON:  Surgeon(s) and Role:    Christia Reading, MD - Primary  PHYSICIAN ASSISTANT:   ASSISTANTS: none   ANESTHESIA:   IV sedation  EBL:  None   BLOOD ADMINISTERED:none  DRAINS: none   LOCAL MEDICATIONS USED:  NONE  SPECIMEN:  No Specimen  DISPOSITION OF SPECIMEN:  N/A  COUNTS:  YES  TOURNIQUET:  * No tourniquets in log *  DICTATION: .Note written in EPIC  PLAN OF CARE: Discharge to home after PACU  PATIENT DISPOSITION:  PACU - hemodynamically stable.   Delay start of Pharmacological VTE agent (>24hrs) due to surgical blood loss or risk of bleeding: no

## 2023-02-16 NOTE — Op Note (Signed)
Preop diagnosis: Obstructive sleep apnea Postop diagnosis: same Procedure: Drug-induced sleep endoscopy Surgeon: Jenne Pane Anesth: IV sedation Compl: None Findings: There is 75% anterior-posterior and 25% lateral wall collapse at the velum making her a candidate for hypoglossal nerve stimulator placement.  There was also marked anterior-posterior collapse at the tongue base. Description:  After discussing risks, benefits, and alternatives, the patient was brought to the operative suite and placed on the operative table in the supine position.  Anesthesia was induced and the patient was given light sedation to simulate natural sleep. When the proper level was reached, an Afrin-soaked pledget was placed in the right nasal passage for a couple of minutes and then removed.  The fiberoptic laryngoscope was then passed to view the pharynx and larynx.  Findings are noted above and the exam was recorded.  After completion, the scope was removed and the patient was returned to anesthesia for wakeup and was moved to the recovery room in stable condition.

## 2023-02-16 NOTE — Transfer of Care (Signed)
Immediate Anesthesia Transfer of Care Note  Patient: Pamela Hatfield  Procedure(s) Performed: DRUG INDUCED SLEEP  ENDOSCOPY (Bilateral)  Patient Location: PACU  Anesthesia Type:MAC  Level of Consciousness: awake, alert , and oriented  Airway & Oxygen Therapy: Patient Spontanous Breathing  Post-op Assessment: Report given to RN, Post -op Vital signs reviewed and stable, and Patient moving all extremities X 4  Post vital signs: Reviewed and stable  Last Vitals:  Vitals Value Taken Time  BP    Temp    Pulse 70 02/16/23 1739  Resp 17 02/16/23 1739  SpO2 98 % 02/16/23 1739  Vitals shown include unvalidated device data.  Last Pain:  Vitals:   02/16/23 1353  TempSrc:   PainSc: 7          Complications: No notable events documented.

## 2023-02-17 ENCOUNTER — Encounter (HOSPITAL_COMMUNITY): Payer: Self-pay | Admitting: Otolaryngology

## 2023-02-17 NOTE — Anesthesia Postprocedure Evaluation (Signed)
Anesthesia Post Note  Patient: Pamela Hatfield  Procedure(s) Performed: DRUG INDUCED SLEEP  ENDOSCOPY (Bilateral)     Patient location during evaluation: PACU Anesthesia Type: MAC Level of consciousness: awake and alert Pain management: pain level controlled Vital Signs Assessment: post-procedure vital signs reviewed and stable Respiratory status: spontaneous breathing Cardiovascular status: stable Anesthetic complications: no   No notable events documented.  Last Vitals:  Vitals:   02/16/23 1740 02/16/23 1750  BP: 137/73 (!) 149/78  Pulse: 70 65  Resp: 17 16  Temp: 36.4 C 36.4 C  SpO2: 98% 97%    Last Pain:  Vitals:   02/16/23 1740  TempSrc:   PainSc: 0-No pain                 Lewie Loron

## 2023-03-06 NOTE — Progress Notes (Unsigned)
Pamela Hatfield, female    DOB: 1951/06/07    MRN: [id number]   Brief patient profile:  72  yowf  quit smoking age 72  referred to pulmonary clinic in [place]  01/20/2023 by Dr Lorrene Reid  for unexplained cough/doe x slow walking at [employer]  x 2022-2023   Prior w/u at [facility] ? Asthma > inhaler helps sometimes    History of Present Illness: 01/20/2023  Pulmonary/ 1st office eval/ Pamela Hatfield / [place] Office on ACEi  Chief Complaint  Patient presents with   Consult    Pt consult states that she has had SOB/DOE ongoing 1 yr. Occurs during exertion and when bending over  Dyspnea:  1-2 aisles at grocery  Cough: dry mostly at hs assoc with choking/ throat closing up  Sleep: 30-45 degrees since onset  SABA use: varies  02: none  Rec Stop vasotec (enalapril) and take valsartan 160 mg one daily  Change prevacid to Take 30-60 min before first meal of the day and add pepcid 20 mg sometime after supper until you return  GERD diet reviewed, bed blocks rec  Please schedule a follow up office visit in 6 weeks, call sooner if needed with all medications /inhalers/ solutions in hand    03/07/2023  f/u ov/[place] office/Pamela Hatfield re: ? Acei case ?  maint on singulair   did not  bring meds  Chief Complaint  Patient presents with   Follow-up    Breathing has improved only slightly. She has been wheezing past 3 days. Denies any cough. She has occ swelling in her feet and hands.   Dyspnea:  no change / feels wheeze when exerts/ has not tried alb prior to ex but at this point more limited by back  Cough: not an issue / noct choking goes away if sleeps at > 30 degrees  Sleeping: not able to tol cpap while on ACEi and no change hypersomnolence off acei/ ENT w/u in progress.  SABA use: not better on vs off but hfa very poor   02: none   No obvious day to day or daytime variability or assoc excess/ purulent sputum or mucus plugs or hemoptysis or cp or chest tightness, subjective wheeze or overt sinus or hb symptoms.     Also denies any obvious fluctuation of symptoms with weather or environmental changes or other aggravating or alleviating factors except as outlined above   No unusual exposure hx or h/o childhood pna/ asthma or knowledge of premature birth.  Current Allergies, Complete Past Medical History, Past Surgical History, Family History, and Social History were reviewed in [employer] record.  ROS  The following are not active complaints unless bolded Hoarseness, sore throat, dysphagia/ globus , dental problems, itching, sneezing,  nasal congestion or discharge of excess mucus or purulent secretions, ear ache,   fever, chills, sweats, unintended wt loss or wt gain, classically pleuritic or exertional cp,  orthopnea pnd or arm/hand swelling  or leg swelling, presyncope, palpitations, abdominal pain, anorexia, nausea, vomiting, diarrhea  or change in bowel habits or change in bladder habits, change in stools or change in urine, dysuria, hematuria,  rash, arthralgias, visual complaints, headache, numbness, weakness or ataxia or problems with walking or coordination,  change in mood or  memory. R post thigh radicular pain / walks with cane         Current Meds  Medication Sig   baclofen (LIORESAL) 10 MG tablet Take 10 mg by mouth daily as needed for muscle  spasms.   buprenorphine (SUBUTEX) 2 MG SUBL SL tablet Place 2 mg under the tongue 4 (four) times daily.   cetirizine (ZYRTEC) 10 MG tablet Take 10 mg by mouth daily as needed for allergies.    Charcoal Activated (ACTIVATED CHARCOAL PO) Take 520 mg by mouth every other day.   Dextromethorphan-guaiFENesin (MUCINEX DM MAXIMUM STRENGTH) 60-1200 MG TB12 Take 1 tablet by mouth daily.   DULoxetine (CYMBALTA) 60 MG capsule Take 60 mg by mouth daily.   estradiol (ESTRACE) 0.1 MG/GM vaginal cream Place 1 Applicatorful vaginally every other day.   famotidine (PEPCID) 20 MG tablet Take 20 mg by mouth daily.   fluticasone (FLONASE) 50  MCG/ACT nasal spray Place 2 sprays into both nostrils daily.   linaclotide (LINZESS) 145 MCG CAPS capsule Take 145 mcg by mouth daily before breakfast.   meloxicam (MOBIC) 7.5 MG tablet Take 1 tablet (7.5 mg total) by mouth daily. (Patient taking differently: Take 7.5 mg by mouth daily as needed for pain.)   methocarbamol (ROBAXIN-750) 750 MG tablet Take 1 tablet (750 mg total) by mouth 4 (four) times daily. (Patient taking differently: Take 750 mg by mouth 3 (three) times daily as needed for muscle spasms.)   metoprolol (LOPRESSOR) 50 MG tablet Take 50 mg by mouth 2 (two) times daily.   montelukast (SINGULAIR) 10 MG tablet Take 10 mg by mouth at bedtime.    Multiple Minerals-Vitamins (CAL-MAG-ZINC-D PO) Take 1 tablet by mouth daily.   Multiple Vitamin (MULTIVITAMIN WITH MINERALS) TABS tablet Take 1 tablet by mouth daily.   Multiple Vitamins-Minerals (HAIR/SKIN/NAILS) CAPS Take 2 capsules by mouth daily.   NON FORMULARY Take 0.5 tablets by mouth daily as needed (pain). CBD Gummies   octreotide (SANDOSTATIN LAR) 10 MG injection Inject 10 mg into the muscle every 28 (twenty-eight) days.   pantoprazole (PROTONIX) 20 MG tablet Take 20 mg by mouth daily.   polyethylene glycol powder (GLYCOLAX/MIRALAX) 17 GM/SCOOP powder Take 17 g by mouth daily as needed for mild constipation or moderate constipation.   PROLIA 60 MG/ML SOSY injection Inject 60 mg into the skin every 6 (six) months.   Sod Picosulfate-Mag Ox-Cit Acd (CLENPIQ) 10-3.5-12 MG-GM -GM/175ML SOLN Take 1 kit by mouth as directed.   trolamine salicylate (BLUE-EMU HEMP) 10 % cream Apply 1 Application topically as needed for muscle pain.   valsartan (DIOVAN) 160 MG tablet Take 1 tablet (160 mg total) by mouth daily.                 Past Medical History:  Diagnosis Date   Accessory spleen    Anxiety    Arthritis    multiple areas of her body   Asthma    Cancer (HCC)    located on her aorta and small intestines- on Octreodtide 10mg  IM  every 28 days for this   Depression    Diabetes mellitus    diet controlled   Dyspnea    today 11/09/2017, reports that her breathing is normal for her    Dysrhythmia    hx rapid heart beat   Fibromyalgia    GERD (gastroesophageal reflux disease)    History of hiatal hernia    Hyperlipidemia    Hypertension    Myalgia and myositis, unspecified    Polyneuropathy    Sleep apnea    no cpap used, pt does not like, use to use CPAP but due to insurance had to return the device    Weight loss 02/25/2011  Objective:    Wts   03/07/2023       165   02/16/23 162 lb 3.2 oz (73.6 kg)  01/20/23 163 lb 12.8 oz (74.3 kg)  10/20/22 174 lb 2.6 oz (79 kg)      Vital signs reviewed  03/07/2023  - Note at rest 02 sats  97% on RA   General appearance:    somber amb (with cane) elderly wf nad   HEENT : Oropharynx  clear     Nasal turbinates nl    NECK :  without  apparent JVD/ palpable Nodes/TM    LUNGS: no acc muscle use,  Nl contour chest which is clear to A and P bilaterally without cough on insp or exp maneuvers   CV:  RRR  no s3 or murmur or increase in P2, and no edema   ABD:  soft and nontender with nl inspiratory excursion in the supine position. No bruits or organomegaly appreciated   MS:  slow gait with cane ext warm without deformities Or obvious joint restrictions  calf tenderness, cyanosis or clubbing    SKIN: warm and dry without lesions    NEURO:  alert, approp, nl sensorium with  no motor or cerebellar deficits apparent.            Assessment

## 2023-03-07 ENCOUNTER — Ambulatory Visit (INDEPENDENT_AMBULATORY_CARE_PROVIDER_SITE_OTHER): Payer: Medicare HMO | Admitting: Internal Medicine

## 2023-03-07 ENCOUNTER — Encounter: Payer: Self-pay | Admitting: Internal Medicine

## 2023-03-07 VITALS — BP 126/74 | HR 60 | Ht 64.0 in | Wt 165.8 lb

## 2023-03-07 DIAGNOSIS — R0609 Other forms of dyspnea: Secondary | ICD-10-CM | POA: Diagnosis not present

## 2023-03-07 DIAGNOSIS — R058 Other specified cough: Secondary | ICD-10-CM | POA: Diagnosis not present

## 2023-03-07 DIAGNOSIS — I10 Essential (primary) hypertension: Secondary | ICD-10-CM | POA: Diagnosis not present

## 2023-03-07 MED ORDER — FAMOTIDINE 20 MG PO TABS: ORAL_TABLET | ORAL | 11 refills | Status: DC

## 2023-03-07 NOTE — Patient Instructions (Addendum)
Ok to try albuterol x 2 puffs 15 min before an activity (on alternating days)  that you know would usually make you short of breath and see if it makes any difference and if makes none then don't take albuterol after activity unless you can't catch your breath as this means it's the resting that helps, not the albuterol.  No change in the other medications - I have sent in pepcid to pharmacy  Please remember to go to the lab department   for your tests - we will call you with the results when they are available.  Call Dr Yetta Barre office re your MRI results       Please schedule a follow up office visit in 6 weeks, call sooner if needed with all medications /inhalers/ solutions in hand so we can verify exactly what you are taking. This includes all medications from all doctors and over the counters

## 2023-03-07 NOTE — Assessment & Plan Note (Addendum)
Onset around 2022 while on acei  - 01/20/2023   Walked on RA  x  2  lap(s) =  approx 300  ft  @ slow pace, stopped due to sob with lowest 02 sats 95%  - try on gerd rx and off ACEi  01/20/2023 > denies improvement > continue off acei/ on max gerd rx and f/u in 6 weeks with all meds in hand  - Allergy screen 03/07/2023 >  Eos 0. /  IgE  pending   The standardized cough guidelines published in Chest by [place] in 2006 are still the best available and consist of a multiple step process (up to 12!) , not a single office visit,  and are intended  to address this problem logically,  with an alogrithm dependent on response to empiric treatment at  each progressive step  to determine a specific diagnosis with  minimal addtional testing needed. Therefore if adherence is an issue or can't be accurately verified,  it's very unlikely the standard evaluation and treatment will be successful here.    Furthermore, response to therapy (other than acute cough suppression, which should only be used short term with avoidance of narcotic containing cough syrups if possible), can be a gradual process for which the patient is not likely to  perceive immediate benefit.  Unlike going to an eye doctor where the best perscription is almost always the first one and is immediately effective, this is almost never the case in the management of chronic cough syndromes. Therefore the patient needs to commit up front to consistently adhere to recommendations  for up to 6 weeks of therapy directed at the likely underlying problem(s) before the response can be reasonably evaluated.   >>> try same rx x 6 weeks and see if can lie flatter in bed vs on acei / allergy eval prn

## 2023-03-07 NOTE — Assessment & Plan Note (Addendum)
Onset 2022 while on acei > d/c 01/20/23  -- spirometry 07/05/17 nl and no better on saba unless uses p exertion  -  03/07/2023  After extensive coaching inhaler device,  effectiveness = near 0% effective (not able to inhale  it)  - 03/07/2023   Walked on RA  x  3  lap(s) =  approx 450  ft  @ slow/cane pace, stopped due to back pain with lowest 02 sats 95% and min sob      Convinced better from saba so ok to use prn and work on technique:  Re SABA :  I spent extra time with pt today reviewing appropriate use of albuterol for prn use on exertion with the following points: 1) saba is for relief of sob that does not improve by walking a slower pace or resting but rather if the pt does not improve after trying this first. 2) If the pt is convinced, as many are, that saba helps recover from activity faster then it's easy to tell if this is the case by re-challenging : ie stop, take the inhaler, then p 5 minutes try the exact same activity (intensity of workload) that just caused the symptoms and see if they are substantially diminished or not after saba 3) if there is an activity that reproducibly causes the symptoms, try the saba 15 min before the activity on alternate days   If in fact the saba really does help, then fine to continue to use it prn but advised may need to look closer at the maintenance regimen (for now = 0)  being used to achieve better control of airways disease with exertion.

## 2023-03-07 NOTE — Assessment & Plan Note (Signed)
Try off acei 01/20/2023 for upper airway cough syndrome  Although even in retrospect it may not be clear the ACEi contributed to the pt's symptoms,   adding it back at this point or in the future would risk confusion in interpretation of non-specific respiratory symptoms to which this patient is prone  ie  Better not to muddy the waters here.    Each maintenance medication was reviewed in detail including emphasizing most importantly the difference between maintenance and prns and under what circumstances the prns are to be triggered using an action plan format where appropriate.  Total time for H and P, chart review, counseling, reviewing hfa device(s) , directly observing portions of ambulatory 02 saturation study/ and generating customized AVS unique to this office visit / same day charting  > 40 min for multiple  refractory respiratory  symptoms of uncertain etiology

## 2023-03-08 LAB — CBC WITH DIFFERENTIAL/PLATELET
Basophils Absolute: 0.1 10*3/uL (ref 0.0–0.2)
Basos: 1 %
EOS (ABSOLUTE): 0.5 10*3/uL — ABNORMAL HIGH (ref 0.0–0.4)
Eos: 6 %
Hematocrit: 37.8 % (ref 34.0–46.6)
Hemoglobin: 11.9 g/dL (ref 11.1–15.9)
Immature Grans (Abs): 0 10*3/uL (ref 0.0–0.1)
Immature Granulocytes: 0 %
Lymphocytes Absolute: 2.7 10*3/uL (ref 0.7–3.1)
Lymphs: 32 %
MCH: 28.1 pg (ref 26.6–33.0)
MCHC: 31.5 g/dL (ref 31.5–35.7)
MCV: 89 fL (ref 79–97)
Monocytes Absolute: 0.7 10*3/uL (ref 0.1–0.9)
Monocytes: 9 %
Neutrophils Absolute: 4.3 10*3/uL (ref 1.4–7.0)
Neutrophils: 52 %
Platelets: 357 10*3/uL (ref 150–450)
RBC: 4.24 x10E6/uL (ref 3.77–5.28)
RDW: 12.6 % (ref 11.7–15.4)
WBC: 8.4 10*3/uL (ref 3.4–10.8)

## 2023-03-08 LAB — BRAIN NATRIURETIC PEPTIDE: BNP: 99.7 pg/mL (ref 0.0–100.0)

## 2023-03-08 LAB — D-DIMER, QUANTITATIVE: D-DIMER: 0.76 mg/L FEU — ABNORMAL HIGH (ref 0.00–0.49)

## 2023-03-08 LAB — TSH: TSH: 2.2 u[IU]/mL (ref 0.450–4.500)

## 2023-03-08 LAB — SEDIMENTATION RATE: Sed Rate: 25 mm/hr (ref 0–40)

## 2023-03-09 LAB — IGE: IgE (Immunoglobulin E), Serum: 82 IU/mL (ref 6–495)

## 2023-04-07 ENCOUNTER — Ambulatory Visit (INDEPENDENT_AMBULATORY_CARE_PROVIDER_SITE_OTHER): Payer: Medicare HMO | Admitting: Obstetrics & Gynecology

## 2023-04-07 ENCOUNTER — Encounter: Payer: Self-pay | Admitting: Obstetrics & Gynecology

## 2023-04-07 VITALS — BP 161/83 | HR 66 | Ht 64.5 in | Wt 170.2 lb

## 2023-04-07 DIAGNOSIS — N941 Unspecified dyspareunia: Secondary | ICD-10-CM

## 2023-04-07 DIAGNOSIS — N952 Postmenopausal atrophic vaginitis: Secondary | ICD-10-CM | POA: Diagnosis not present

## 2023-04-07 DIAGNOSIS — R102 Pelvic and perineal pain: Secondary | ICD-10-CM

## 2023-04-07 NOTE — Progress Notes (Signed)
GYN VISIT Patient name: Pamela Hatfield MRN [id number]  Date of birth: 08/15/1951 Chief Complaint:   Dyspareunia  History of Present Illness:   Pamela Hatfield is a 72 y.o.  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 Ralph Dowdy. Pt given a cream and advised to use a small drop weekly.  So far, she has not seen much improvement. Pt reports considerable dyspareunia and feels like there is a spot on the right side that when her partner presses on it causes discomfort.  On occasion may not pain outside of intercourse- typically 1-2x per month.  Tried several lubricants with minimal improvement.  -h/o hysterectomy due to cervical dysplasia and followed by pap smears Last pap 11/2022 negative  Prior pap 2020 and 2022 showed epithelial abnormality   No LMP recorded. Patient has had a hysterectomy.   Review of Systems:   Pertinent items are noted in HPI Denies fever/chills, dizziness, headaches, visual disturbances, fatigue, shortness of breath, chest pain, abdominal pain, vomiting. Pertinent History Reviewed:   Past Surgical History:  Procedure Laterality Date   ABDOMINAL HYSTERECTOMY  yrs ago   ovaries remain, done because of endometriosis   ANTERIOR CERVICAL DECOMP/DISCECTOMY FUSION N/A 11/16/2017   Procedure: Anterior Cervical Decompression/Discectomy Fusion - Cervical four-Cervical five - Cervical five-Cervical six - Cervical six-Cervical seven;  Surgeon: Tia Alert, MD;  Location: [facility] OR;  Service: Neurosurgery;  Laterality: N/A;   ANTERIOR CERVICAL DECOMP/DISCECTOMY FUSION N/A 04/05/2022   Procedure: Anterior Cervical Discectomy and Fusion Cervical Three-Four, Removal of Plate Cervical Four-Five;  Surgeon: Tia Alert, MD;  Location: [facility] OR;  Service: Neurosurgery;  Laterality: N/A;  3C   BACK SURGERY  yrs ago   lower back   BIOPSY  08/18/2022   Procedure: BIOPSY;  Surgeon: Corbin Ade, MD;  Location: [facility];   Service: Endoscopy;;   BREAST BIOPSY Bilateral    benign biopsies   BREAST LUMPECTOMY     both breast   CARDIAC CATHETERIZATION     CARPAL TUNNEL RELEASE Right 2003   CARPAL TUNNEL RELEASE Left 02/12/2020   Procedure: CARPAL TUNNEL RELEASE;  Surgeon: Vickki Hearing, MD;  Location: [facility] ORS;  Service: Orthopedics;  Laterality: Left;   CHOLECYSTECTOMY  yrs ago   COLONOSCOPY  06/2012   normal   COLONOSCOPY WITH PROPOFOL N/A 08/18/2022   Procedure: COLONOSCOPY WITH PROPOFOL;  Surgeon: Corbin Ade, MD;  Location: [facility];  Service: Endoscopy;  Laterality: N/A;  10:00am, asa 3   DRUG INDUCED ENDOSCOPY Bilateral 02/16/2023   Procedure: DRUG INDUCED SLEEP  ENDOSCOPY;  Surgeon: Christia Reading, MD;  Location: [facility] OR;  Service: ENT;  Laterality: Bilateral;   ESOPHAGEAL DILATION  06/14/2012   Procedure: ESOPHAGEAL DILATION;  Surgeon: Corbin Ade, MD;  Location: [facility];  Service: Endoscopy;;   EUS  07/13/2012   Procedure: UPPER ENDOSCOPIC ULTRASOUND (EUS) LINEAR;  Surgeon: Rachael Fee, MD;  Location: [facility];  Service: Endoscopy;  Laterality: N/A;   FLEXIBLE SIGMOIDOSCOPY N/A 03/24/2022   Procedure: FLEXIBLE SIGMOIDOSCOPY;  Surgeon: Corbin Ade, MD;  Location: [facility];  Service: Endoscopy;  Laterality: N/A;   NOSE SURGERY  yrs ago   ROTATOR CUFF REPAIR  yrs ago   left    Past Medical History:  Diagnosis Date   Accessory spleen    Anxiety    Arthritis    multiple areas of her body   Asthma  Cancer [facility])    located on her aorta and small intestines- on Octreodtide 10mg  IM every 28 days for this   Chronic cluster headache    Depression    Diabetes mellitus    diet controlled   Dyspnea    today 11/09/2017, reports that her breathing is normal for her    Dysrhythmia    hx rapid heart beat   Fibromyalgia    GERD (gastroesophageal reflux disease)    History of hiatal hernia    Hyperlipidemia    Hypertension    Myalgia and myositis, unspecified     Polyneuropathy    Sleep apnea    no cpap used, pt does not like, use to use CPAP but due to insurance had to return the device    Weight loss 02/25/2011   Reviewed problem list, medications and allergies. Physical Assessment:   Vitals:   04/07/23 1514  BP: (!) 161/83  Pulse: 66  Weight: 170 lb 3.2 oz (77.2 kg)  Height: 5' 4.5" (1.638 m)  Body mass index is 28.76 kg/m.       Physical Examination:   General appearance: alert, well appearing, and in no distress  Psych: mood appropriate, normal affect  Skin: warm & dry   Cardiovascular: normal heart rate noted  Respiratory: normal respiratory effort, no distress  Abdomen: tense abdomen, no rebound, no guarding, no tenderness on exam  Pelvic: VULVA: normal appearing vulva with no masses, tenderness or lesions, VAGINA:flat mucosa with atrophic changes noted.  Uterus and cervix surgically absent.  On right side 1cm flat circular ?fibroma- no bleeding, non-tender.  No adnexal masses or tenderness noted on exam  Extremities: no edema, no calf tenderness bilaterally  Chaperone:  pt declined     Assessment & Plan:  1) Dyspareunia - plan to increase estrace to nightly 1-2g x 2 weeks then twice per week - continue regular use of lubricants -f/u in 3mos  2) pelvic pain -no abnormalities noted on exam -CT completed 10/2022- no adnexal abnormalities -will continue to monitor, should symptoms worsen may consider pelvic US   Return in about 2 months (around 06/08/2023) for Medication follow up, with Dr. Charlotta Newton.   Myna Hidalgo, DO Attending Obstetrician & Gynecologist, [facility] for [employer], [facility]

## 2023-04-17 NOTE — Progress Notes (Unsigned)
Pamela Hatfield, female    DOB: 13-Apr-1951    MRN: [id number]   Brief patient profile:  80  yowf  quit smoking age 72  referred to pulmonary clinic in [place]  01/20/2023 by Dr Lorrene Reid  for unexplained cough/doe x slow walking at [employer]  x 2022-2023   Prior w/u at [facility] ? Asthma > inhaler helps sometimes / declined to return to [facility]     History of Present Illness: 01/20/2023  Pulmonary/ 1st office eval/ Sherene Sires / [facility] on ACEi  Chief Complaint  Patient presents with   Consult    Pt consult states that she has had SOB/DOE ongoing 1 yr. Occurs during exertion and when bending over  Dyspnea:  1-2 aisles at grocery  Cough: dry mostly at hs assoc with choking/ throat closing up  Sleep: 30-45 degrees since onset  SABA use: varies  02: none  Rec Stop vasotec (enalapril) and take valsartan 160 mg one daily  Change prevacid to Take 30-60 min before first meal of the day and add pepcid 20 mg sometime after supper until you return  GERD diet reviewed, bed blocks rec  Please schedule a follow up office visit in 6 weeks, call sooner if needed with all medications /inhalers/ solutions in hand.   03/07/2023  f/u ov/[place] office/ re: ? Acei case ?  maint on singulair   did not  bring meds  Chief Complaint  Patient presents with   Follow-up    Breathing has improved only slightly. She has been wheezing past 3 days. Denies any cough. She has occ swelling in her feet and hands.   Dyspnea:  no change / feels wheeze when exerts/ has not tried alb prior to ex but at this point more limited by back  Cough: not an issue / noct choking goes away if sleeps at > 30 degrees  Sleeping: not able to tol cpap while on ACEi and no change hypersomnolence off acei/ ENT w/u in progress.  SABA use: not better on vs off but hfa very poor   02: none  Rec Ok to try albuterol x 2 puffs 15 min before an activity (on alternating days)  that you know would usually make you short of breath Call Dr Yetta Barre  office re your MRI results  Please schedule a follow up office visit in 6 weeks, call sooner if needed with all medications /inhalers/ solutions in hand    Allergy screen 03/07/2023 >  Eos 0.5/  IgE  52   04/18/2023  f/u ov/[place] office/ re: doe ? Etiology  maint on singulair, gerd rx  did not  bring any meds at all, easily confused with names  Chief Complaint  Patient presents with   UACS  Dyspnea:  has not been able to do grocery store in  several years comfortably related to back not breathing  Cough: night time choking never better even off acei with sensation of pnds and green mucus production  onset x one week, assoc nasal congestion  Sleeping: in recliner at 30 degrees  SABA use: not helping  02: none    No obvious day to day or daytime variability or assoc   or hemoptysis or cp or chest tightness, subjective wheeze or overt hb symptoms.    . Also denies any obvious fluctuation of symptoms with weather or environmental changes or other aggravating or alleviating factors except as outlined above   No unusual exposure hx or h/o childhood pna/ asthma or knowledge  of premature birth.  Current Allergies, Complete Past Medical History, Past Surgical History, Family History, and Social History were reviewed in [employer] record.  ROS  The following are not active complaints unless bolded Hoarseness, sore throat, dysphagia, dental problems, itching, sneezing,  nasal congestion or discharge of excess mucus or purulent secretions, ear ache,   fever, chills, sweats, unintended wt loss or wt gain, classically pleuritic or exertional cp,  orthopnea pnd or arm/hand swelling  or leg swelling, presyncope, palpitations, abdominal pain, anorexia, nausea, vomiting, diarrhea  or change in bowel habits or change in bladder habits, change in stools or change in urine, dysuria, hematuria,  rash, arthralgias, visual complaints, headache, numbness, weakness or ataxia or problems  with walking or coordination,  change in mood or  memory.       Meds confusing, not able to do med rec accurately    Past Medical History:  Diagnosis Date   Accessory spleen    Anxiety    Arthritis    multiple areas of her body   Asthma    Cancer (HCC)    located on her aorta and small intestines- on Octreodtide 10mg  IM every 28 days for this   Depression    Diabetes mellitus    diet controlled   Dyspnea    today 11/09/2017, reports that her breathing is normal for her    Dysrhythmia    hx rapid heart beat   Fibromyalgia    GERD (gastroesophageal reflux disease)    History of hiatal hernia    Hyperlipidemia    Hypertension    Myalgia and myositis, unspecified    Polyneuropathy    Sleep apnea    no cpap used, pt does not like, use to use CPAP but due to insurance had to return the device    Weight loss 02/25/2011       Objective:    Wts  04/18/2023         167  03/07/2023       165   02/16/23 162 lb 3.2 oz (73.6 kg)  01/20/23 163 lb 12.8 oz (74.3 kg)  10/20/22 174 lb 2.6 oz (79 kg)     Vital signs reviewed  04/18/2023  - Note at rest 02 sats  92% on RA   General appearance:    somer wf freq throat clearing   HEENT : Oropharynx  clear        NECK :  without  apparent JVD/ palpable Nodes/TM    LUNGS: no acc muscle use,  Nl contour chest which is clear to A and P bilaterally without cough on insp or exp maneuvers   CV:  RRR  no s3 or murmur or increase in P2, and no edema   ABD:  soft and nontender   MS:  Nl gait/ ext warm without deformities Or obvious joint restrictions  calf tenderness, cyanosis or clubbing    SKIN: warm and dry without lesions    NEURO:  alert, approp, nl sensorium with  no motor or cerebellar deficits apparent.         Assessment

## 2023-04-18 ENCOUNTER — Ambulatory Visit: Payer: Medicare HMO | Admitting: Internal Medicine

## 2023-04-18 ENCOUNTER — Encounter: Payer: Self-pay | Admitting: Internal Medicine

## 2023-04-18 VITALS — BP 145/72 | HR 60 | Ht 64.5 in | Wt 167.0 lb

## 2023-04-18 DIAGNOSIS — I10 Essential (primary) hypertension: Secondary | ICD-10-CM

## 2023-04-18 DIAGNOSIS — R058 Other specified cough: Secondary | ICD-10-CM | POA: Diagnosis not present

## 2023-04-18 MED ORDER — CLONIDINE HCL 0.1 MG PO TABS
ORAL_TABLET | ORAL | 60 refills | Status: DC
Start: 2023-04-18 — End: 2023-05-19

## 2023-04-18 MED ORDER — AMOXICILLIN-POT CLAVULANATE 875-125 MG PO TABS: ORAL_TABLET | ORAL | 0 refills | Status: DC

## 2023-04-18 MED ORDER — PREDNISONE 10 MG PO TABS: ORAL_TABLET | ORAL | 0 refills | Status: DC

## 2023-04-18 NOTE — Patient Instructions (Addendum)
Augmentin 875 mg take one pill twice daily  X 10 days - take at breakfast and supper with large glass of water.  It would help reduce the usual side effects (diarrhea and yeast infections) if you ate cultured yogurt at lunch.   Prednisone 10 mg take  4 each am x 2 days,   2 each am x 2 days,  1 each am x 2 days and stop   Clonidine 0.1 mg twice daily and then restart your valsartan 160 mg daily and stop enalapril   Ok to take the valsartan 160 mg twice daily if blood pressure too high   For cough /congestion  > mucinex dm 1200 mg twice daily   Keep candy handy =  lifesavers (not the white ones)  stovers, jolly ranchers or ice but try to keep from clearing your throat    Pantoprazole 40 mg Take 30-60 min before first meal of the day and continue pepcid every evening  Return on Friday August 9th with all medications in hand - separate the medications you take every day vs those you take just as needed

## 2023-04-19 ENCOUNTER — Encounter: Payer: Self-pay | Admitting: Internal Medicine

## 2023-04-19 NOTE — Assessment & Plan Note (Signed)
Try off acei 01/20/2023 for upper airway cough syndrome> improved transiently  - d/c acei again 04/18/2023   ACE inhibitors are problematic in  pts with airway complaints because  even experienced pulmonologists can't consistently distinguish ace effects from copd/asthma.  By themselves they don't actually cause a problem, much like oxygen can't by itself start a fire, but they certainly serve as a powerful catalyst or enhancer for any "fire"  or inflammatory process in the upper airway, be it caused by an rhinitis/ sinusitis (as is likely the case here)  or  reflux.    In the era of ARB near equivalency until we have a better handle on the reversibility of the airway problem, it just makes sense to avoid ACEI  entirely in the short run and then decide later, having established a level of airway control using a reasonable limited regimen, whether to add back ace but even then being very careful to observe the pt for worsening airway control and number of meds used/ needed to control symptoms.   >>> rec try back on valsartan 160 mg up to bid and add clonidine 0.1 mg bid as some of her spikes in bp may well be related to pain/ anxiety   F/u q week to "take ownership" of this problem in one office until satisfied the ACEi is the not the case here.         Each maintenance medication was reviewed in detail including emphasizing most importantly the difference between maintenance and prns and under what circumstances the prns are to be triggered using an action plan format where appropriate.  Total time for H and P, chart review, counseling, reviewing hfa device(s) and generating customized AVS unique to this office visit / same day charting > 40 min for  refractory respiratory  symptoms of uncertain etiology

## 2023-04-19 NOTE — Assessment & Plan Note (Signed)
Onset around 2022 while on acei  - 01/20/2023   Walked on RA  x  2  lap(s) =  approx 300  ft  @ slow pace, stopped due to sob with lowest 02 sats 95%  - try on gerd rx and off ACEi  01/20/2023 > denies improvement > continue off acei/ on max gerd rx and f/u in 6 weeks with all meds in hand  - Allergy screen 03/07/2023 >  Eos 0.5/  IgE  52 - 04/18/2023 back on ACEi with worse cough/ rhinitis/ sinusitis > rec off acei, rx augmentin x 10 days and pred x 6 d and f/u weekly   Discussed in detail all the  indications, usual  risks and alternatives  relative to the benefits with patient who agrees to proceed with Rx as outlined.

## 2023-04-20 DIAGNOSIS — D509 Iron deficiency anemia, unspecified: Secondary | ICD-10-CM | POA: Insufficient documentation

## 2023-04-21 NOTE — Progress Notes (Deleted)
Pamela Hatfield, female    DOB: 20-Dec-1950    MRN: [id number]   Brief patient profile:  72  yowf  quit smoking age 72  referred to pulmonary clinic in [place]  01/20/2023 by Dr Pamela Hatfield  for unexplained cough/doe x slow walking at [employer]  x 2022-2023   Prior w/u at [facility] ? Asthma > inhaler helps sometimes / declined to return to [facility]     History of Present Illness: 01/20/2023  Pulmonary/ 1st office eval/ Pamela Hatfield / [facility] on ACEi  Chief Complaint  Patient presents with   Consult    Pt consult states that she has had SOB/DOE ongoing 1 yr. Occurs during exertion and when bending over  Dyspnea:  1-2 aisles at grocery  Cough: dry mostly at hs assoc with choking/ throat closing up  Sleep: 30-45 degrees since onset  SABA use: varies  02: none  Rec Stop vasotec (enalapril) and take valsartan 160 mg one daily  Change prevacid to Take 30-60 min before first meal of the day and add pepcid 20 mg sometime after supper until you return  GERD diet reviewed, bed blocks rec  Please schedule a follow up office visit in 6 weeks, call sooner if needed with all medications /inhalers/ solutions in hand.   03/07/2023  f/u ov/[place] office/ re: ? Acei case ?  maint on singulair   did not  bring meds  Chief Complaint  Patient presents with   Follow-up    Breathing has improved only slightly. She has been wheezing past 3 days. Denies any cough. She has occ swelling in her feet and hands.   Dyspnea:  no change / feels wheeze when exerts/ has not tried alb prior to ex but at this point more limited by back  Cough: not an issue / noct choking goes away if sleeps at > 30 degrees  Sleeping: not able to tol cpap while on ACEi and no change hypersomnolence off acei/ ENT w/u in progress.  SABA use: not better on vs off but hfa very poor   02: none  Rec Ok to try albuterol x 2 puffs 15 min before an activity (on alternating days)  that you know would usually make you short of breath Call Dr Pamela Hatfield  office re your MRI results  Please schedule a follow up office visit in 6 weeks, call sooner if needed with all medications /inhalers/ solutions in hand    Allergy screen 03/07/2023 >  Eos 0.5/  IgE  52   04/18/2023  f/u ov/[place] office/ re: doe ? Etiology  maint on singulair, gerd rx  did not  bring any meds at all, easily confused with names  Chief Complaint  Patient presents with   UACS  Dyspnea:  has not been able to do grocery store in  several years comfortably related to back not breathing  Cough: night time choking never better even off acei with sensation of pnds and green mucus production  onset x one week, assoc nasal congestion  Sleeping: in recliner at 30 degrees  SABA use: not helping  02: none   Rec Augmentin 875 mg take one pill twice daily  X 10 days   Prednisone 10 mg take  4 each am x 2 days,   2 each am x 2 days,  1 each am x 2 days and stop  Clonidine 0.1 mg twice daily and then restart your valsartan 160 mg daily and stop enalapril  Ok to take the  valsartan 160 mg twice daily if blood pressure too high  For cough /congestion  > mucinex dm 1200 mg twice daily  Keep candy handy =  lifesavers (not the white ones)  stovers, jolly ranchers or ice but try to keep from clearing your throat  Pantoprazole 40 mg Take 30-60 min before first meal of the day and continue pepcid every evening Return on Friday August 9th with all medications in hand - separate the medications you take every day vs those you take just as needed      04/22/2023  f/u ov/[place] office/ re: uacs/severe cyclical cough maint on *** did *** bring all meds  No chief complaint on file.   Dyspnea:  *** Cough: *** Sleeping: *** SABA use: *** 02: *** Covid status: *** Lung cancer screening: ***   No obvious day to day or daytime variability or assoc excess/ purulent sputum or mucus plugs or hemoptysis or cp or chest tightness, subjective wheeze or overt sinus or hb symptoms.   ***  without nocturnal  or early am exacerbation  of respiratory  c/o's or need for noct saba. Also denies any obvious fluctuation of symptoms with weather or environmental changes or other aggravating or alleviating factors except as outlined above   No unusual exposure hx or h/o childhood pna/ asthma or knowledge of premature birth.  Current Allergies, Complete Past Medical History, Past Surgical History, Family History, and Social History were reviewed in [employer] record.  ROS  The following are not active complaints unless bolded Hoarseness, sore throat, dysphagia, dental problems, itching, sneezing,  nasal congestion or discharge of excess mucus or purulent secretions, ear ache,   fever, chills, sweats, unintended wt loss or wt gain, classically pleuritic or exertional cp,  orthopnea pnd or arm/hand swelling  or leg swelling, presyncope, palpitations, abdominal pain, anorexia, nausea, vomiting, diarrhea  or change in bowel habits or change in bladder habits, change in stools or change in urine, dysuria, hematuria,  rash, arthralgias, visual complaints, headache, numbness, weakness or ataxia or problems with walking or coordination,  change in mood or  memory.        No outpatient medications have been marked as taking for the 04/22/23 encounter (Appointment) with Pamela Cowden, MD.         Past Medical History:  Diagnosis Date   Accessory spleen    Anxiety    Arthritis    multiple areas of her body   Asthma    Cancer (HCC)    located on her aorta and small intestines- on Octreodtide 10mg  IM every 28 days for this   Depression    Diabetes mellitus    diet controlled   Dyspnea    today 11/09/2017, reports that her breathing is normal for her    Dysrhythmia    hx rapid heart beat   Fibromyalgia    GERD (gastroesophageal reflux disease)    History of hiatal hernia    Hyperlipidemia    Hypertension    Myalgia and myositis, unspecified    Polyneuropathy     Sleep apnea    no cpap used, pt does not like, use to use CPAP but due to insurance had to return the device    Weight loss 02/25/2011       Objective:    Wts  04/22/2023          ***  04/18/2023         167  03/07/2023  165   02/16/23 162 lb 3.2 oz (73.6 kg)  01/20/23 163 lb 12.8 oz (74.3 kg)  10/20/22 174 lb 2.6 oz (79 kg)     Vital signs reviewed  04/22/2023  - Note at rest 02 sats  ***% on ***   General appearance:    ***            Assessment

## 2023-04-22 ENCOUNTER — Telehealth: Payer: Self-pay | Admitting: *Deleted

## 2023-04-22 ENCOUNTER — Ambulatory Visit: Payer: Medicare HMO | Admitting: Internal Medicine

## 2023-04-22 MED ORDER — FLUCONAZOLE 100 MG PO TABS: ORAL_TABLET | ORAL | 2 refills | Status: DC

## 2023-04-22 NOTE — Telephone Encounter (Signed)
Patient called to get a prescription to treat a  yeast infection. Patient has started feeling symptoms since this morning 8/9. Denies fever or chills and states she has eating yogurt but it has gotten worse throughout the day.  Next OV 8/26 with Dr. Sherene Sires  Patient uses [facility]

## 2023-04-22 NOTE — Telephone Encounter (Signed)
Patient given Augmentin 04/18/23 OV  Please advise if ok to send Diflucan. Thanks!

## 2023-04-22 NOTE — Telephone Encounter (Signed)
Rx sent to pharmacy   

## 2023-04-22 NOTE — Telephone Encounter (Signed)
Difluxan 100 mg q d x 3 days  refill x 2

## 2023-05-07 NOTE — Progress Notes (Signed)
Pamela Hatfield, female    DOB: 1950-11-29    MRN: [id number]   Brief patient profile:  24  yowf  quit smoking age 72  referred to pulmonary clinic in [place]  01/20/2023 by Dr Lorrene Reid  for unexplained cough/doe x slow walking at [employer]  x 2022-2023   Prior w/u at [facility] ? Asthma > inhaler helps sometimes / declined to return to [facility]     History of Present Illness: 01/20/2023  Pulmonary/ 1st office eval/ Pamela Hatfield / [facility] on [facility]i  Chief Complaint  Patient presents with   Consult    Pt consult states that she has had SOB/DOE ongoing 1 yr. Occurs during exertion and when bending over  Dyspnea:  1-2 aisles at grocery  Cough: dry mostly at hs assoc with choking/ throat closing up  Sleep: 30-45 degrees since onset  SABA use: varies  02: none  Rec Stop vasotec (enalapril) and take valsartan 160 mg one daily  Change prevacid to Take 30-60 min before first meal of the day and add pepcid 20 mg sometime after supper until you return  GERD diet reviewed, bed blocks rec  Please schedule a follow up office visit in 6 weeks, call sooner if needed with all medications /inhalers/ solutions in hand.   03/07/2023  f/u ov/[place] office/Pamela Hatfield re: ? Acei case ?  maint on singulair   did not  bring meds  Chief Complaint  Patient presents with   Follow-up    Breathing has improved only slightly. She has been wheezing past 3 days. Denies any cough. She has occ swelling in her feet and hands.   Dyspnea:  no change / feels wheeze when exerts/ has not tried alb prior to ex but at this point more limited by back  Cough: not an issue / noct choking goes away if sleeps at > 30 degrees  Sleeping: not able to tol cpap while on ACEi and no change hypersomnolence off acei/ ENT w/u in progress.  SABA use: not better on vs off but hfa very poor   02: none  Rec Ok to try albuterol x 2 puffs 15 min before an activity (on alternating days)  that you know would usually make you short of breath Call Dr Yetta Barre  office re your MRI results  Please schedule a follow up office visit in 6 weeks, call sooner if needed with all medications /inhalers/ solutions in hand    Allergy screen 03/07/2023 >  Eos 0.5/  IgE  52   04/18/2023  f/u ov/[place] office/Pamela Hatfield re: doe ? Etiology  maint on singulair, gerd rx  did not  bring any meds at all, easily confused with names  Chief Complaint  Patient presents with   UACS  Dyspnea:  has not been able to do grocery store in  several years comfortably related to back not breathing  Cough: night time choking never better even off acei with sensation of pnds and green mucus production  onset x one week, assoc nasal congestion  Sleeping: in recliner at 30 degrees  SABA use: not helping  02: none   Rec Augmentin 875 mg take one pill twice daily  X 10 days   Prednisone 10 mg take  4 each am x 2 days,   2 each am x 2 days,  1 each am x 2 days and stop  Clonidine 0.1 mg twice daily and then restart your valsartan 160 mg daily and stop enalapril  Ok to take the  valsartan 160 mg twice daily if blood pressure too high  For cough /congestion  > mucinex dm 1200 mg twice daily  Keep candy handy =  lifesavers (not the white ones)  stovers, jolly ranchers or ice but try to keep from clearing your throat  Pantoprazole 40 mg Take 30-60 min before first meal of the day and continue pepcid every evening Return on Friday August 9th with all medications in hand - separate the medications you take every day vs those you take just as needed      05/09/2023  f/u ov/[place] office/Avereigh Spainhower re: uacs/severe cyclical cough maint on *** did *** bring all meds  No chief complaint on file.   Dyspnea:  *** Cough: *** Sleeping: *** SABA use: *** 02: *** Covid status: *** Lung cancer screening: ***   No obvious day to day or daytime variability or assoc excess/ purulent sputum or mucus plugs or hemoptysis or cp or chest tightness, subjective wheeze or overt sinus or hb symptoms.   ***  without nocturnal  or early am exacerbation  of respiratory  c/o's or need for noct saba. Also denies any obvious fluctuation of symptoms with weather or environmental changes or other aggravating or alleviating factors except as outlined above   No unusual exposure hx or h/o childhood pna/ asthma or knowledge of premature birth.  Current Allergies, Complete Past Medical History, Past Surgical History, Family History, and Social History were reviewed in [employer] record.  ROS  The following are not active complaints unless bolded Hoarseness, sore throat, dysphagia, dental problems, itching, sneezing,  nasal congestion or discharge of excess mucus or purulent secretions, ear ache,   fever, chills, sweats, unintended wt loss or wt gain, classically pleuritic or exertional cp,  orthopnea pnd or arm/hand swelling  or leg swelling, presyncope, palpitations, abdominal pain, anorexia, nausea, vomiting, diarrhea  or change in bowel habits or change in bladder habits, change in stools or change in urine, dysuria, hematuria,  rash, arthralgias, visual complaints, headache, numbness, weakness or ataxia or problems with walking or coordination,  change in mood or  memory.        No outpatient medications have been marked as taking for the 05/09/23 encounter (Appointment) with Nyoka Cowden, MD.         Past Medical History:  Diagnosis Date   Accessory spleen    Anxiety    Arthritis    multiple areas of her body   Asthma    Cancer (HCC)    located on her aorta and small intestines- on Octreodtide 10mg  IM every 28 days for this   Depression    Diabetes mellitus    diet controlled   Dyspnea    today 11/09/2017, reports that her breathing is normal for her    Dysrhythmia    hx rapid heart beat   Fibromyalgia    GERD (gastroesophageal reflux disease)    History of hiatal hernia    Hyperlipidemia    Hypertension    Myalgia and myositis, unspecified    Polyneuropathy     Sleep apnea    no cpap used, pt does not like, use to use CPAP but due to insurance had to return the device    Weight loss 02/25/2011       Objective:    Wts  05/09/2023          ***  04/18/2023         167  03/07/2023  165   02/16/23 162 lb 3.2 oz (73.6 kg)  01/20/23 163 lb 12.8 oz (74.3 kg)  10/20/22 174 lb 2.6 oz (79 kg)     Vital signs reviewed  05/09/2023  - Note at rest 02 sats  ***% on ***   General appearance:    ***            Assessment

## 2023-05-09 ENCOUNTER — Encounter: Payer: Self-pay | Admitting: Internal Medicine

## 2023-05-09 ENCOUNTER — Other Ambulatory Visit (HOSPITAL_COMMUNITY): Payer: Self-pay | Admitting: Geriatric Medicine

## 2023-05-09 ENCOUNTER — Ambulatory Visit: Payer: Medicare HMO | Admitting: Internal Medicine

## 2023-05-09 VITALS — BP 130/75 | HR 66 | Ht 64.5 in | Wt 171.0 lb

## 2023-05-09 DIAGNOSIS — I10 Essential (primary) hypertension: Secondary | ICD-10-CM

## 2023-05-09 DIAGNOSIS — R058 Other specified cough: Secondary | ICD-10-CM

## 2023-05-09 DIAGNOSIS — Z1231 Encounter for screening mammogram for malignant neoplasm of breast: Secondary | ICD-10-CM

## 2023-05-09 MED ORDER — METHYLPREDNISOLONE ACETATE 80 MG/ML IJ SUSP
120.0000 mg | Freq: Once | INTRAMUSCULAR | Status: AC
Start: 2023-05-09 — End: 2023-05-09
  Administered 2023-05-09: 120 mg via INTRAMUSCULAR

## 2023-05-09 MED ORDER — ALBUTEROL SULFATE HFA 108 (90 BASE) MCG/ACT IN AERS: INHALATION_SPRAY | RESPIRATORY_TRACT | Status: DC

## 2023-05-09 NOTE — Patient Instructions (Addendum)
Depomedrol 120 mg IM today  along with the albuterol  inhaler at bedtime x 2 puffs   Stop zyrtec for now   If still having the night time cough  after a few days > add   For drainage / throat tickle try take CHLORPHENIRAMINE  4 mg  ("Allergy Relief" 4mg   at [facility] should be easiest to find in the blue box usually on bottom shelf)  take one every 4 hours as needed - extremely effective and inexpensive over the counter- may cause drowsiness so start with just a dose or two an hour before bedtime and see how you tolerate it before trying in daytime.    I we find you do have asthma we may change the metaprolol to bisprolol 5 mg up to twice daily    Please schedule a follow up office visit in 6 weeks, call sooner if needed - please bring inhaler

## 2023-05-09 NOTE — Assessment & Plan Note (Addendum)
Onset around 2022 while on acei  - 01/20/2023   Walked on RA  x  2  lap(s) =  approx 300  ft  @ slow pace, stopped due to sob with lowest 02 sats 95%  - try on gerd rx and off ACEi  01/20/2023 > denies improvement > continue off acei/ on max gerd rx and f/u in 6 weeks with all meds in hand  - Allergy screen 03/07/2023 >  Eos 0.5/  IgE  52 - 04/18/2023 back on ACEi with worse cough/ rhinitis/ sinusitis > rec off acei, rx augmentin x 10 days and pred x 6 d and f/u weekly > purulent sputum resolved 05/09/2023 > try 1st gen H1 blockers per guidelines    Upper airway cough syndrome (previously labeled PNDS),  is so named because it's frequently impossible to sort out how much is  CR/sinusitis with freq throat clearing (which can be related to primary GERD)   vs  causing  secondary (" extra esophageal")  GERD from wide swings in gastric pressure that occur with throat clearing, often  promoting self use of mint and menthol lozenges that reduce the lower esophageal sphincter tone and exacerbate the problem further in a cyclical fashion.   These are the same pts (now being labeled as having "irritable larynx syndrome" by some cough centers) who not infrequently have a history of having failed to tolerate ace inhibitors,  dry powder inhalers or biphosphonates or report having atypical/extraesophageal reflux symptoms that don't respond to standard doses of PPI  and are easily confused as having aecopd or asthma flares by even experienced allergists/ pulmonologists (myself included).   >>> will try rx with depomedrol/ saba for a few nights for baseline then add the 1st gen H1 blockers per guidelines    >>> - The proper method of use, as well as anticipated side effects, of a metered-dose inhaler were discussed and demonstrated to the patient using teach back method.

## 2023-05-09 NOTE — Assessment & Plan Note (Signed)
Try off acei 01/20/2023 for upper airway cough syndrome> improved transiently  - d/c acei again 04/18/2023   Bp much better on present rx which includes lopressor 100 mg per day   In the setting of respiratory symptoms of unknown etiology,  It would be preferable to use bystolic, the most beta -1  selective Beta blocker available in sample form, with bisoprolol the most selective generic choice  on the market, at least on a trial basis, to make sure the spillover Beta 2 effects of the less specific Beta blockers are not contributing to this patient's symptoms.   Will consider making this change on the next ov depending on response to above          Each maintenance medication was reviewed in detail including emphasizing most importantly the difference between maintenance and prns and under what circumstances the prns are to be triggered using an action plan format where appropriate.  Total time for H and P, chart review, counseling, reviewing hfa device(s) and generating customized AVS unique to this office visit / same day charting = > 30 min for multiple  refractory respiratory  symptoms of uncertain etiology

## 2023-05-17 ENCOUNTER — Other Ambulatory Visit: Payer: Self-pay | Admitting: Internal Medicine

## 2023-05-18 ENCOUNTER — Ambulatory Visit (HOSPITAL_COMMUNITY): Payer: Medicare HMO

## 2023-05-26 ENCOUNTER — Encounter (HOSPITAL_COMMUNITY): Payer: Self-pay

## 2023-05-26 ENCOUNTER — Ambulatory Visit (HOSPITAL_COMMUNITY)
Admission: RE | Admit: 2023-05-26 | Discharge: 2023-05-26 | Disposition: A | Discharge status: Home / Self Care | Payer: Medicare HMO | Source: Ambulatory Visit | Attending: Geriatric Medicine | Admitting: Geriatric Medicine

## 2023-05-26 DIAGNOSIS — Z1231 Encounter for screening mammogram for malignant neoplasm of breast: Secondary | ICD-10-CM | POA: Insufficient documentation

## 2023-06-19 NOTE — Progress Notes (Unsigned)
Pamela Hatfield, female    DOB: 02-Jul-1951    MRN: [id number]   Brief patient profile:  72  yowf  quit smoking age 72  referred to pulmonary clinic in [place]  01/20/2023 by Dr Lorrene Reid  for unexplained cough/doe x slow walking at [employer]  x 2022-2023   Prior w/u at [facility] ? Asthma > inhaler helps sometimes / declined to return to [facility]  S/p nasal surgeries x 30 years prior to OV     History of Present Illness: 01/20/2023  Pulmonary/ 1st office eval/ Sherene Sires / [place] [facility] on ACEi  Chief Complaint  Patient presents with   Consult    Pt consult states that she has had SOB/DOE ongoing 1 yr. Occurs during exertion and when bending over  Dyspnea:  1-2 aisles at grocery  Cough: dry mostly at hs assoc with choking/ throat closing up  Sleep: 30-45 degrees since onset  SABA use: varies  02: none  Rec Stop vasotec (enalapril) and take valsartan 160 mg one daily  Change prevacid to Take 30-60 min before first meal of the day and add pepcid 20 mg sometime after supper until you return  GERD diet reviewed, bed blocks rec  Please schedule a follow up office visit in 6 weeks, call sooner if needed with all medications /inhalers/ solutions in hand.   03/07/2023  f/u ov/[place] office/Chidinma Clites re: ? Acei case ?  maint on singulair   did not  bring meds  Chief Complaint  Patient presents with   Follow-up    Breathing has improved only slightly. She has been wheezing past 3 days. Denies any cough. She has occ swelling in her feet and hands.   Dyspnea:  no change / feels wheeze when exerts/ has not tried alb prior to ex but at this point more limited by back  Cough: not an issue / noct choking goes away if sleeps at > 30 degrees  Sleeping: not able to tol cpap while on ACEi and no change hypersomnolence off acei/ ENT w/u in progress.  SABA use: not better on vs off but hfa very poor   02: none  Rec Ok to try albuterol x 2 puffs 15 min before an activity (on alternating days)  that you know would  usually make you short of breath Call Dr Yetta Barre office re your MRI results  Please schedule a follow up office visit in 6 weeks, call sooner if needed with all medications /inhalers/ solutions in hand    Allergy screen 03/07/2023 >  Eos 0.5/  IgE  52   04/18/2023  f/u ov/[place] office/Seve Monette re: doe ? Etiology  maint on singulair, gerd rx  did not  bring any meds at all, easily confused with names  Chief Complaint  Patient presents with   UACS  Dyspnea:  has not been able to do full  grocery store in  several years comfortably related to back not breathing  Cough: night time choking never better even off acei with sensation of pnds and green mucus production  onset x one week, assoc nasal congestion  Sleeping: in recliner at 30 degrees  SABA use: not helping  02: none   Rec Augmentin 875 mg take one pill twice daily  X 10 days   Prednisone 10 mg take  4 each am x 2 days,   2 each am x 2 days,  1 each am x 2 days and stop  Clonidine 0.1 mg twice daily and then restart your  valsartan 160 mg daily and stop enalapril  Ok to take the valsartan 160 mg twice daily if blood pressure too high  For cough /congestion  > mucinex dm 1200 mg twice daily  Keep candy handy =  lifesavers (not the white ones)  stovers, jolly ranchers or ice but try to keep from clearing your throat  Pantoprazole 40 mg Take 30-60 min before first meal of the day and continue pepcid every evening Return on Friday August 9th with all medications in hand - separate the medications you take every day vs those you take just as needed    05/09/2023  f/u ov/[place] office/Daurice Ovando re: uacs/severe cyclical cough  maint on mucinex / singulair  did  bring all meds  Chief Complaint  Patient presents with   Upper airway cough syndrome   Dyspnea:  back limits her more than breathing after a couple of aisles  Cough: no change cough frequency  q hs > no production at all  Sleeping: 30 degrees/ not able to use cpap due to cough / Dr Jenne Pane  following  SABA use: ?may help but did not bring  Rec Depomedrol 120 mg IM today  along with the albuterol  inhaler at bedtime x 2 puffs  Stop zyrtec for now  If still having the night time cough  after a few days > add   For drainage / throat tickle try take CHLORPHENIRAMINE  4 mg If we find you do have asthma we may change the metaprolol to bisprolol 5 mg up to twice daily   Please schedule a follow up office visit in 6 weeks, call sooner if needed - please bring inhaler    06/20/2023  f/u ov/[place] office/Cathlin Buchan re: uacs maint on prn saba did not  bring meds  Chief Complaint  Patient presents with   Upper airway cough syndrome  Dyspnea:  [employer] shopping / uses [facility] parking  doing better with walking  Cough: much better, still sense of PNDS (didn't mention it previously as present ever since sinus surgery 30 y prior to OV   Sleeping: better than previously s resp cc  SABA use: once or twice  a day at most  02: none    No obvious day to day or daytime variability or assoc excess/ purulent sputum or mucus plugs or hemoptysis or cp or chest tightness, subjective wheeze or overt  hb symptoms.    Also denies any obvious fluctuation of symptoms with weather or environmental changes or other aggravating or alleviating factors except as outlined above   No unusual exposure hx or h/o childhood pna/ asthma or knowledge of premature birth.  Current Allergies, Complete Past Medical History, Past Surgical History, Family History, and Social History were reviewed in [employer] record.  ROS  The following are not active complaints unless bolded Hoarseness, sore throat, dysphagia, dental problems, itching, sneezing,  nasal congestion or discharge of excess mucus or purulent secretions, ear ache,   fever, chills, sweats, unintended wt loss or wt gain, classically pleuritic or exertional cp,  orthopnea pnd or arm/hand swelling  or leg swelling, presyncope, palpitations,  abdominal pain, anorexia, nausea, vomiting, diarrhea  or change in bowel habits or change in bladder habits, change in stools or change in urine, dysuria, hematuria,  rash, arthralgias, visual complaints, headache, numbness, weakness or ataxia or problems with walking or coordination,  change in mood or  memory.        Current Meds -  - NOTE:  Unable to verify as accurately reflecting what pt takes    Medication Sig   Acetaminophen Extra Strength 500 MG CAPS Take 2 capsules by mouth every 8 (eight) hours.   albuterol (PROAIR HFA) 108 (90 Base) MCG/ACT inhaler 2 puffs every 4 hours as needed only  if your can't catch your breath   amitriptyline (ELAVIL) 25 MG tablet Take 25 mg by mouth at bedtime.   baclofen (LIORESAL) 10 MG tablet Take 10 mg by mouth daily as needed for muscle spasms.   buprenorphine (SUBUTEX) 2 MG SUBL SL tablet Place 2 mg under the tongue 4 (four) times daily.   cetirizine (ZYRTEC) 10 MG tablet Take 10 mg by mouth daily as needed for allergies.    Charcoal Activated (ACTIVATED CHARCOAL PO) Take 520 mg by mouth every other day.   cloNIDine (CATAPRES) 0.1 MG tablet TAKE ONE TABLET BY MOUTH EVERY MORNING AND ANOTHER BEFORE BED   Dextromethorphan-guaiFENesin (MUCINEX DM MAXIMUM STRENGTH) 60-1200 MG TB12 Take 1 tablet by mouth daily.   DULoxetine (CYMBALTA) 60 MG capsule Take 60 mg by mouth daily.   estradiol (ESTRACE) 0.1 MG/GM vaginal cream Place 1 Applicatorful vaginally every other day.   famotidine (PEPCID) 20 MG tablet Take after supper daily   fluticasone (FLONASE) 50 MCG/ACT nasal spray Place 2 sprays into both nostrils daily.   linaclotide (LINZESS) 145 MCG CAPS capsule Take 145 mcg by mouth daily before breakfast.   meloxicam (MOBIC) 7.5 MG tablet Take 1 tablet (7.5 mg total) by mouth daily. (Patient taking differently: Take 7.5 mg by mouth daily as needed for pain.)   methocarbamol (ROBAXIN-750) 750 MG tablet Take 1 tablet (750 mg total) by mouth 4 (four) times daily.  (Patient taking differently: Take 750 mg by mouth 3 (three) times daily as needed for muscle spasms.)   metoprolol (LOPRESSOR) 50 MG tablet Take 50 mg by mouth 2 (two) times daily.   montelukast (SINGULAIR) 10 MG tablet Take 10 mg by mouth at bedtime.    Multiple Vitamins-Minerals (HAIR/SKIN/NAILS) CAPS Take 2 capsules by mouth daily.   NON FORMULARY Take 0.5 tablets by mouth daily as needed (pain). CBD Gummies   octreotide (SANDOSTATIN LAR) 10 MG injection Inject 10 mg into the muscle every 28 (twenty-eight) days.   oxyCODONE (OXY IR/ROXICODONE) 5 MG immediate release tablet Take 5 mg by mouth every 4 (four) hours as needed.   pantoprazole (PROTONIX) 20 MG tablet Take 20 mg by mouth daily.   polyethylene glycol powder (GLYCOLAX/MIRALAX) 17 GM/SCOOP powder Take 17 g by mouth daily as needed for mild constipation or moderate constipation.   PROLIA 60 MG/ML SOSY injection Inject 60 mg into the skin every 6 (six) months.   Sod Picosulfate-Mag Ox-Cit Acd (CLENPIQ) 10-3.5-12 MG-GM -GM/175ML SOLN Take 1 kit by mouth as directed.   trolamine salicylate (BLUE-EMU HEMP) 10 % cream Apply 1 Application topically as needed for muscle pain.   valsartan (DIOVAN) 160 MG tablet Take 1 tablet (160 mg total) by mouth 2 (two) times daily.   [DISCONTINUED] valsartan (DIOVAN) 160 MG tablet Take 1 tablet (160 mg total) by mouth daily.         Past Medical History:  Diagnosis Date   Accessory spleen    Anxiety    Arthritis    multiple areas of her body   Asthma    Cancer (HCC)    located on her aorta and small intestines- on Octreodtide 10mg  IM every 28 days for this   Depression    Diabetes  mellitus    diet controlled   Dyspnea    today 11/09/2017, reports that her breathing is normal for her    Dysrhythmia    hx rapid heart beat   Fibromyalgia    GERD (gastroesophageal reflux disease)    History of hiatal hernia    Hyperlipidemia    Hypertension    Myalgia and myositis, unspecified    Polyneuropathy     Sleep apnea    no cpap used, pt does not like, use to use CPAP but due to insurance had to return the device    Weight loss 02/25/2011       Objective:    Wts  06/20/2023        165   05/09/2023       171  04/18/2023         167  03/07/2023       165   02/16/23 162 lb 3.2 oz (73.6 kg)  01/20/23 163 lb 12.8 oz (74.3 kg)  10/20/22 174 lb 2.6 oz (79 kg)     Vital signs reviewed  06/20/2023  - Note at rest 02 sats  98% on RA   General appearance:    somber wf nad    HEENT : Oropharynx  nl      Nasal turbinates min turbinate edema s polyps    NECK :  without  apparent JVD/ palpable Nodes/TM    LUNGS: no acc muscle use,  Nl contour chest which is clear to A and P bilaterally without cough on insp or exp maneuvers   CV:  RRR  no s3 or murmur or increase in P2, and no edema   ABD:  soft and nontender with nl inspiratory excursion in the supine position. No bruits or organomegaly appreciated   MS:  Nl gait/ ext warm without deformities Or obvious joint restrictions  calf tenderness, cyanosis or clubbing    SKIN: warm and dry without lesions    NEURO:  alert, approp, nl sensorium with  no motor or cerebellar deficits apparent.       Assessment

## 2023-06-20 ENCOUNTER — Encounter: Payer: Self-pay | Admitting: Internal Medicine

## 2023-06-20 ENCOUNTER — Ambulatory Visit: Payer: Medicare HMO | Admitting: Internal Medicine

## 2023-06-20 VITALS — BP 145/84 | HR 109 | Ht 64.5 in | Wt 165.0 lb

## 2023-06-20 DIAGNOSIS — R058 Other specified cough: Secondary | ICD-10-CM | POA: Diagnosis not present

## 2023-06-20 DIAGNOSIS — I10 Essential (primary) hypertension: Secondary | ICD-10-CM

## 2023-06-20 MED ORDER — VALSARTAN 160 MG PO TABS
160.0000 mg | ORAL_TABLET | Freq: Two times a day (BID) | ORAL | 11 refills | Status: DC
Start: 2023-06-20 — End: 2024-05-11

## 2023-06-20 NOTE — Patient Instructions (Addendum)
Increase the Valsartan 160 mg one twice daily and follow up with your PCP before your prescription runs out   Continue metaprolol 50 mg twice daily   Clonidine 0.1 mg can be used up to every 4  hours any time your top  blood pressure is over 140   Chlorpheniramine can be used up to 2 every 4 hours as needed but may make her drowsy  My office will be contacting you by phone for referral for sinus CT  - if you don't hear back from my office within one week please call us back or notify us thru MyChart and we'll address it right away.   Pulmonary follow up is as needed - must bring all active medications

## 2023-06-21 ENCOUNTER — Telehealth: Payer: Self-pay | Admitting: Internal Medicine

## 2023-06-21 NOTE — Telephone Encounter (Signed)
PT states the pharm will not fill her cloNIDine (CATAPRES) 0.1 MG tablet [[id number]] . She said this happened before and Dr. Sherene Sires said to call him if it did. Pharm is : [facility]  Her # is [phone]

## 2023-06-21 NOTE — Assessment & Plan Note (Signed)
Try off acei 01/20/2023 for upper airway cough syndrome> improved transiently  - d/c acei again 04/18/2023   She has not followed instructions from prior visits namely if not satisfied with bp control, increases valsartan to 160 mg bid .  Since still feels she needs saba would caution against titrating up Beta blockers except in the form of bisoprolol which can be increased as high as 10 mg bid but defer this to PCP  In meantime, just as is done in ER,  she can titrate clonidine as high as 0.6 mg daily acutely until her other meds have a change to reach equilibrium with desired effects and will defer further titration to PCP going forward  Discussed in detail all the  indications, usual  risks and alternatives  relative to the benefits with patient who agrees to proceed with Rx as outlined.             Each maintenance medication was reviewed in detail including emphasizing most importantly the difference between maintenance and prns and under what circumstances the prns are to be triggered using an action plan format where appropriate.  Total time for H and P, chart review, counseling, reviewing hfa device(s) and generating customized AVS unique to this office visit / same day charting > 40 min for  poorly controlled hbp and refractory respiratory  symptoms of uncertain etiology

## 2023-06-21 NOTE — Assessment & Plan Note (Signed)
Onset around 2022 while on acei  - 01/20/2023   Walked on RA  x  2  lap(s) =  approx 300  ft  @ slow pace, stopped due to sob with lowest 02 sats 95%  - try on gerd rx and off ACEi  01/20/2023 > denies improvement > continue off acei/ on max gerd rx and f/u in 6 weeks with all meds in hand  - Allergy screen 03/07/2023 >  Eos 0.5/  IgE  52 - 04/18/2023 back on ACEi with worse cough/ rhinitis/ sinusitis > rec off acei, rx augmentin x 10 days and pred x 6 d and f/u weekly > purulent sputum resolved 05/09/2023 > try 1st gen H1 blockers per guidelines  added   - cough improved though unclear re adherence to rx 06/20/2023 > f/u with all meds in hand if not satisfied  - Sinus CT 06/20/2023 to complete the w/u 06/20/2023 >>>  I don't believe she has a lung problem but may benefit from re-eval by ENT so f/u here can be pnr as above

## 2023-06-24 ENCOUNTER — Other Ambulatory Visit: Payer: Self-pay

## 2023-06-24 MED ORDER — CLONIDINE HCL 0.1 MG PO TABS: ORAL_TABLET | ORAL | 5 refills | Status: DC

## 2023-06-24 NOTE — Telephone Encounter (Signed)
Sent rx to pharmacy

## 2023-06-26 ENCOUNTER — Ambulatory Visit (HOSPITAL_COMMUNITY)
Admission: RE | Admit: 2023-06-26 | Discharge: 2023-06-26 | Disposition: A | Discharge status: Home / Self Care | Payer: Medicare HMO | Source: Ambulatory Visit | Attending: Internal Medicine | Admitting: Internal Medicine

## 2023-06-26 DIAGNOSIS — R058 Other specified cough: Secondary | ICD-10-CM | POA: Insufficient documentation

## 2023-07-10 NOTE — Progress Notes (Unsigned)
Cardiology Office Note:  .   Date:  07/11/2023  ID:  Pamela Hatfield, DOB 13-May-1951, MRN [id number] PCP: Theodis Shove, DO  [facility] Providers Cardiologist:  Orbie Pyo, MD   History of Present Illness: .   Pamela Hatfield is a 72 y.o. female with a past medical history of HTN, type 2 DM, HLD, carcinoid tumor. Patient is followed by Dr. Lynnette Caffey and presents today for evaluation of BP.  Per chart review, patient previously wore a cardiac monitor in 04/2016 that showed NSR. Later established care with Dr. Lynnette Caffey in 01/2022 for evaluation of chest pain. Underwent coronary CTA in 01/2022 that showed a calcium score of 10 (41st percentile), minimal non-obstructive CAD (1-24%). Echocardiogram showed EF 60-65%, no regional wall motion abnormalities, grade I DD, normal RV function. Patient has not been seen by cardiology since that time   Today, patient reports that she is concerned about her blood pressure being elevated at home. Reports that her blood pressure has been consistently high, with readings ranging from 160/95 to 180/110. This issue has been ongoing for a couple of months and is associated with symptoms of fatigue, chest tightness, and occasional blurred vision and headaches. The patient also reports color changes in her toes, attributed to neuropathy. Denies chest pain or shortness of breath on exertion. Has rare episodes of dizziness that occur randomly and are not always associated with positional changes.    The patient's pulmonary doctor had previously adjusted her medication regimen for BP due to concerns that her BP medications were contributing to SOB. He also started her on clonidine, which she has been taking up to four times a day when her blood pressure exceeds 140. Patient reports following with pulmonology for shortness of breath, but denies diagnoses of COPD or asthma. Cause of her shortness of breath is still somewhat unclear   The patient has a history  of cancer and is currently undergoing treatment, with monthly blood checks at a local hospital. She also reports a family history of heart disease. The patient's primary care provider has been monitoring her kidney function and electrolytes. The patient has been feeling increasingly tired, often sleeping most of the day.  The patient's blood pressure issue is a recent development, and she expresses concern due to her family history of heart disease. She has been recording her blood pressure readings at home and has noticed that her blood pressure often runs high. The patient is eager to get her blood pressure under control and is open to medication adjustments.  ROS: Per HPI, otherwise negative   Studies Reviewed: .   Cardiac Studies & Procedures       ECHOCARDIOGRAM  ECHOCARDIOGRAM COMPLETE 02/01/2022  Narrative ECHOCARDIOGRAM REPORT    Patient Name:   Pamela Hatfield Date of Exam: 02/01/2022 Medical Rec #:  [id number]       Height:       65.0 in Accession #:    [id number]      Weight:       179.6 lb Date of Birth:  08/07/1951        BSA:          1.890 m Patient Age:    71 years        BP:           129/77 mmHg Patient Gender: F               HR:  62 bpm. [facility]  Procedure: 2D Echo, Cardiac Doppler and Color Doppler  Indications:    Dyspnea  History:        Patient has no prior history of Echocardiogram examinations. Signs/Symptoms:Chest Pain and Dyspnea; Risk Factors:Hypertension, Diabetes, Dyslipidemia and Former Smoker.  Sonographer:    Mikki Harbor Referring Phys: [id number] Orbie Pyo  IMPRESSIONS   1. Left ventricular ejection fraction, by estimation, is 60 to 65%. The left ventricle has normal function. The left ventricle has no regional wall motion abnormalities. Left ventricular diastolic parameters are consistent with Grade I diastolic dysfunction (impaired relaxation). 2. Right ventricular systolic function is normal. The right  ventricular size is normal. Tricuspid regurgitation signal is inadequate for assessing PA pressure. 3. The mitral valve is normal in structure. No evidence of mitral valve regurgitation. No evidence of mitral stenosis. 4. The aortic valve was not well visualized. Aortic valve regurgitation is trivial. No aortic stenosis is present. 5. The inferior vena cava is normal in size with greater than 50% respiratory variability, suggesting right atrial pressure of 3 mmHg.  FINDINGS Left Ventricle: Left ventricular ejection fraction, by estimation, is 60 to 65%. The left ventricle has normal function. The left ventricle has no regional wall motion abnormalities. The left ventricular internal cavity size was normal in size. There is no left ventricular hypertrophy. Left ventricular diastolic parameters are consistent with Grade I diastolic dysfunction (impaired relaxation).  Right Ventricle: The right ventricular size is normal. No increase in right ventricular wall thickness. Right ventricular systolic function is normal. Tricuspid regurgitation signal is inadequate for assessing PA pressure.  Left Atrium: Left atrial size was normal in size.  Right Atrium: Right atrial size was normal in size.  Pericardium: Trivial pericardial effusion is present. Presence of epicardial fat layer.  Mitral Valve: The mitral valve is normal in structure. No evidence of mitral valve regurgitation. No evidence of mitral valve stenosis. MV peak gradient, 3.1 mmHg. The mean mitral valve gradient is 1.0 mmHg.  Tricuspid Valve: The tricuspid valve is normal in structure. Tricuspid valve regurgitation is trivial.  Aortic Valve: The aortic valve was not well visualized. Aortic valve regurgitation is trivial. No aortic stenosis is present. Aortic valve mean gradient measures 4.0 mmHg. Aortic valve peak gradient measures 7.3 mmHg. Aortic valve area, by VTI measures 2.45 cm.  Pulmonic Valve: The pulmonic valve was not well  visualized. Pulmonic valve regurgitation is not visualized.  Aorta: The aortic root and ascending aorta are structurally normal, with no evidence of dilitation.  Venous: The inferior vena cava is normal in size with greater than 50% respiratory variability, suggesting right atrial pressure of 3 mmHg.  IAS/Shunts: The interatrial septum was not well visualized.   LEFT VENTRICLE PLAX 2D LVIDd:         5.00 cm     Diastology LVIDs:         2.90 cm     LV e' medial:    5.00 cm/s LV PW:         0.70 cm     LV E/e' medial:  11.9 LV IVS:        0.70 cm     LV e' lateral:   6.85 cm/s LVOT diam:     1.90 cm     LV E/e' lateral: 8.7 LV SV:         75 LV SV Index:   40 LVOT Area:     2.84 cm  LV Volumes (MOD) LV  vol d, MOD A2C: 28.7 ml LV vol d, MOD A4C: 33.6 ml LV vol s, MOD A2C: 10.0 ml LV vol s, MOD A4C: 12.6 ml LV SV MOD A2C:     18.7 ml LV SV MOD A4C:     33.6 ml LV SV MOD BP:      19.7 ml  RIGHT VENTRICLE RV Basal diam:  3.10 cm RV Mid diam:    2.60 cm RV S prime:     11.50 cm/s TAPSE (M-mode): 3.0 cm  LEFT ATRIUM             Index        RIGHT ATRIUM           Index LA diam:        3.80 cm 2.01 cm/m   RA Area:     14.90 cm LA Vol (A2C):   31.5 ml 16.67 ml/m  RA Volume:   35.40 ml  18.73 ml/m LA Vol (A4C):   53.0 ml 28.05 ml/m LA Biplane Vol: 44.7 ml 23.65 ml/m AORTIC VALVE                    PULMONIC VALVE AV Area (Vmax):    2.07 cm     PV Vmax:       0.48 m/s AV Area (Vmean):   1.92 cm     PV Peak grad:  0.9 mmHg AV Area (VTI):     2.45 cm AV Vmax:           135.00 cm/s AV Vmean:          99.600 cm/s AV VTI:            0.307 m AV Peak Grad:      7.3 mmHg AV Mean Grad:      4.0 mmHg LVOT Vmax:         98.60 cm/s LVOT Vmean:        67.400 cm/s LVOT VTI:          0.265 m LVOT/AV VTI ratio: 0.86  AORTA Ao Root diam: 2.60 cm Ao Asc diam:  3.10 cm  MITRAL VALVE MV Area (PHT): 3.24 cm    SHUNTS MV Area VTI:   2.83 cm    Systemic VTI:  0.26 m MV Peak  grad:  3.1 mmHg    Systemic Diam: 1.90 cm MV Mean grad:  1.0 mmHg MV Vmax:       0.87 m/s MV Vmean:      49.8 cm/s MV Decel Time: 234 msec MV E velocity: 59.60 cm/s MV A velocity: 82.30 cm/s MV E/A ratio:  0.72  Epifanio Lesches MD Electronically signed by Epifanio Lesches MD Signature Date/Time: 02/01/2022/4:11:01 PM    Final    MONITORS  CARDIAC EVENT MONITOR 05/12/2016  Narrative 1. NSR   CT SCANS  CT CORONARY MORPH W/CTA COR W/SCORE 01/28/2022  Addendum 01/28/2022  4:29 PM ADDENDUM REPORT: 01/28/2022 16:26  CLINICAL DATA:  This over-read does not include interpretation of cardiac or coronary anatomy or pathology. The coronary CTA interpretation by the cardiologist is attached.  COMPARISON:  None available.  FINDINGS: No suspicious nodules, masses, or infiltrates are identified in the visualized portion of the lungs. No pleural fluid seen.  The visualized portions of the mediastinum and chest wall are unremarkable.  IMPRESSION: No significant non-cardiac abnormality identified.   Electronically Signed By: Danae Orleans M.D. On: 01/28/2022 16:26  Narrative CLINICAL DATA:  72 year old White Female  EXAM: Cardiac/Coronary  CTA  TECHNIQUE: The patient was scanned on a [employer].  FINDINGS: Scan was triggered in the descending thoracic aorta. Axial non-contrast 3 mm slices were carried out through the heart. The data set was analyzed on a dedicated work station and scored using the Agatson method. Gantry rotation speed was 250 msecs and collimation was .6 mm. 0.8 mg of sl NTG was given. The 3D data set was reconstructed in 5% intervals of the 67-82 % of the R-R cycle. Diastolic phases were analyzed on a dedicated work station using MPR, MIP and VRT modes. The patient received 100 cc of contrast.  Aorta:  Normal size.  Aortic atherosclerosis.  No dissection.  Main Pulmonary Artery: Dilation of the main pulmonary artery, moderate,  32 mm.  Aortic Valve:  Tri-leaflet.  No calcifications.  Coronary Arteries:  Normal coronary origin.  Left dominance.  Coronary Calcium Score:  Left main: 0  Left anterior descending artery: 10  Left circumflex artery: 0  Right coronary artery: 0  Total: 10  Percentile: 41st for age, sex, and race matched control.  RCA is a large non-dominant artery.  There is no significant plaque.  Left main is a large artery that gives rise to LAD and LCX arteries. There is no significant plaque.  LAD is a large vessel that gives rise to one two diagonal branches. Minimal non obstructive calcified plaque in the proximal LAD.  LCX is a dominant artery that gives rise to one OM1 branch that bifurcates. There is no significant plaque.  Other findings:  Normal pulmonary vein drainage into the left atrium.  Normal left atrial appendage without a thrombus.  Small atrial diverticulum-normal variant anatomy.  Extra-cardiac findings: See attached radiology report for non-cardiac structures.  Cardiac motion slab artifact.  IMPRESSION: 1. Coronary calcium score of 10 this was 41st percentile for age, sex, and race matched control.  2. Normal coronary origin with left dominance.  3. Dilation of the main pulmonary artery, moderate, 32 mm. This can be associated with the presence of pulmonary hypertension; clinical correlation advised.  4. Aortic atherosclerosis.  5. CAD-RADS 1. Minimal non-obstructive CAD (1-24%). Consider non-atherosclerotic causes of chest pain. Consider preventive therapy and risk factor modification.  RECOMMENDATIONS:  Coronary artery calcium (CAC) score is a strong predictor of incident coronary heart disease (CHD) and provides predictive information beyond traditional risk factors. CAC scoring is reasonable to use in the decision to withhold, postpone, or initiate statin therapy in intermediate-risk or selected borderline-risk asymptomatic adults (age  25-75 years and LDL-C >=70 to <190 mg/dL) who do not have diabetes or established atherosclerotic cardiovascular disease (ASCVD).* In intermediate-risk (10-year ASCVD risk >=7.5% to <20%) adults or selected borderline-risk (10-year ASCVD risk >=5% to <7.5%) adults in whom a CAC score is measured for the purpose of making a treatment decision the following recommendations have been made:  If CAC = 0, it is reasonable to withhold statin therapy and reassess in 5 to 10 years, as long as higher risk conditions are absent (diabetes mellitus, family history of premature CHD in first degree relatives (males <55 years; females <65 years), cigarette smoking, LDL >=190 mg/dL or other independent risk factors).  If CAC is 1 to 99, it is reasonable to initiate statin therapy for patients >=42 years of age.  If CAC is >=100 or >=75th percentile, it is reasonable to initiate statin therapy at any age.  Cardiology referral should be considered for patients with CAC scores =400 or >=75th percentile.  *  2018 AHA/ACC/AACVPR/AAPA/ABC/ACPM/ADA/AGS/APhA/ASPC/NLA/PCNA Guideline on the Management of Blood Cholesterol: A Report of the [employer] of Cardiology/American Heart Association Task Force on Clinical Practice Guidelines. J Am Coll Cardiol. 2019;73(24):3168-3209.  Riley Lam, MD  Electronically Signed: By: Riley Lam M.D. On: 01/28/2022 15:14          Risk Assessment/Calculations:             Physical Exam:   VS:  BP 131/80   Pulse 66   Ht 5\' 5"  (1.651 m)   Wt 168 lb 12.8 oz (76.6 kg)   SpO2 98%   BMI 28.09 kg/m    Wt Readings from Last 3 Encounters:  07/11/23 168 lb 12.8 oz (76.6 kg)  06/20/23 165 lb (74.8 kg)  05/09/23 171 lb (77.6 kg)    GEN: Well nourished, well developed in no acute distress. Sitting comfortably in the chair  NECK: No JVD  CARDIAC: RRR, no murmurs, rubs, gallops. Radial pulses 2+ bilaterally  RESPIRATORY:  Clear to auscultation  without rales, wheezing or rhonchi. Normal work of breathing on room air  ABDOMEN: Soft, non-tender, non-distended EXTREMITIES:  No edema in BLE; No deformity   ASSESSMENT AND PLAN: .    HTN - Patient reports that her BP has been elevated at home- often in the 160s-180s systolic with diastolic in the 90s. - Currently on metoprolol tartrate 50 mg BID, valsartan 160 mg BID, and clonidine 0.1 mg up to 4 times daily as needed for elevated BP  - BP well controlled today in clinic (131/80). Per review of recorded vital signs in epic, appears that SBP has been in the 130s-160s for the past 3 months.  - Stop metoprolol tartrate and start carvedilol 6.125 mg BID  - Continue valsartan 160 mg BID  - OK to continue clonidine for now. Patient would prefer to get off clonidine in the future, which I agree with. She has been taking around 3-4 doses of clonidine per day. Instructed her to decrease to twice daily dosing with plans to taper off completely in the next few weeks  - Discussed low sodium diets  - Instructed patient to keep a BP log and to bring to follow up appointment. If BP remains elevated, consider addition of amlodipine vs titration of carvedilol. Instructed patient to call office if BP consistently elevated above 140s systolic   Minimal CAD on CT scan  HLD  - Coronary CTA from 01/2023 showed minimal nonobstructive CAD, coronary calcium score of 10 (41st percentile)  - Patient occasionally has chest pressure when BP significantly elevated. Denies exertional chest pain or DOE - Patient has been intolerant of statins in the past. LDL 84 in 09/2021  - Plan to check lipid panel at appointment in 07/2023. If LDL remains elevated, may need referral to pharm D for [id number]   Type 2 DM  - Followed by PCP   Carcinoid Tumor  - Followed by oncology at [facility]   Dispo: Follow up in 3-4 weeks   Signed, Jonita Albee, PA-C

## 2023-07-11 ENCOUNTER — Encounter: Payer: Self-pay | Admitting: Cardiology

## 2023-07-11 ENCOUNTER — Ambulatory Visit: Payer: Medicare HMO | Attending: Cardiology | Admitting: Cardiology

## 2023-07-11 VITALS — BP 131/80 | HR 66 | Ht 65.0 in | Wt 168.8 lb

## 2023-07-11 DIAGNOSIS — E1169 Type 2 diabetes mellitus with other specified complication: Secondary | ICD-10-CM | POA: Diagnosis not present

## 2023-07-11 DIAGNOSIS — I152 Hypertension secondary to endocrine disorders: Secondary | ICD-10-CM | POA: Diagnosis not present

## 2023-07-11 DIAGNOSIS — D3A00 Benign carcinoid tumor of unspecified site: Secondary | ICD-10-CM | POA: Diagnosis not present

## 2023-07-11 DIAGNOSIS — E1159 Type 2 diabetes mellitus with other circulatory complications: Secondary | ICD-10-CM | POA: Diagnosis not present

## 2023-07-11 DIAGNOSIS — E785 Hyperlipidemia, unspecified: Secondary | ICD-10-CM

## 2023-07-11 DIAGNOSIS — I2510 Atherosclerotic heart disease of native coronary artery without angina pectoris: Secondary | ICD-10-CM | POA: Diagnosis not present

## 2023-07-11 MED ORDER — CARVEDILOL 6.25 MG PO TABS: 6.2500 mg | ORAL_TABLET | Freq: Two times a day (BID) | ORAL | 3 refills | Status: DC

## 2023-07-11 NOTE — Patient Instructions (Signed)
Medication Instructions:  Stop Metoprolol Start Carvedilol 6.25 mg one tablet twice a day *If you need a refill on your cardiac medications before your next appointment, please call your pharmacy*   Lab Work: No labs   Testing/Procedures: No testing   Follow-Up: At [facility], you and your health needs are our priority.  As part of our continuing mission to provide you with exceptional heart care, we have created designated Provider Care Teams.  These Care Teams include your primary Cardiologist (physician) and Advanced Practice Providers (APPs -  Physician Assistants and Nurse Practitioners) who all work together to provide you with the care you need, when you need it.  We recommend signing up for the patient portal called "MyChart".  Sign up information is provided on this After Visit Summary.  MyChart is used to connect with patients for Virtual Visits (Telemedicine).  Patients are able to view lab/test results, encounter notes, upcoming appointments, etc.  Non-urgent messages can be sent to your provider as well.   To learn more about what you can do with MyChart, go to [email or web address].    Your next appointment:   November 25 th at 1:55 pm with Robet Leu, PA-C  Other Instructions Keep blood pressure log for the next couple of weeks call into our office if blood pressure is above the 140's range for systolic.

## 2023-07-21 ENCOUNTER — Other Ambulatory Visit (HOSPITAL_COMMUNITY): Payer: Self-pay

## 2023-07-21 ENCOUNTER — Telehealth: Payer: Self-pay | Admitting: Internal Medicine

## 2023-07-21 MED ORDER — CARVEDILOL 12.5 MG PO TABS
12.5000 mg | ORAL_TABLET | Freq: Two times a day (BID) | ORAL | 3 refills | Status: DC
Start: 2023-07-21 — End: 2023-08-02
  Filled 2023-07-21: qty 180, 90d supply, fill #0

## 2023-07-21 NOTE — Telephone Encounter (Signed)
Calling in with bp numbers  10/28 150/87 HR 78 10/29 150/84 HR 71           136/87 HR 91 10/30 135/77 HR 69           130/84 HR 78 10/31 124/80 HR 76           146/86 HR 83 11/01 125/63 HR 55           155/90 HR 92 11/02 160/91 HR 72           127/80 HR 82 11/03 140/96 HR 83 11/04 118/83 HR 71           158/95 HR 67 11/05 161/96 HR 74           155/97 HR 79 11/06 152/92 HR 74           175/104 HR 85 11/07 176/102 HR 71  Patient has been out of medication for 3 days. Pharmacy wouldn't fill it till today. Please advise

## 2023-07-21 NOTE — Telephone Encounter (Signed)
Patient reports that she ran out of clonidine early due to having to take more than prescribed.  She had been using 3-4 per day.  Most recently - with the numbers other than the last 3 days - she has used three clonidine daily.  Then she ran out so numbers have run up as seen in the list.    Restarted clonidine today.  Seen 10/28 by APP for BP with recommendations.  I will send message to APP for further recommendations now.  I also did schedule her in HTN clinic with PharmD.  Pt is in agreement with this plan but I adv if Adin Hector, PA-C has other recommendations we cancel PharmD visit.  Her next APP visit is 08/08/23.

## 2023-07-21 NOTE — Telephone Encounter (Signed)
Agree with PharmD appointment. She can also increase her carvedilol to 12.5 mg BID. Decrease clonidine to 2 times daily    Thanks  KJ    Called and reviewed with patient to stay w clonidine twice a day and increase carvedilol to 12.5 mg BID.  I updated the med list and sent updated prescription to pharmacy

## 2023-08-02 ENCOUNTER — Encounter: Payer: Self-pay | Admitting: Student

## 2023-08-02 ENCOUNTER — Ambulatory Visit: Payer: Medicare HMO | Attending: Cardiovascular Disease | Admitting: Student

## 2023-08-02 VITALS — BP 137/70 | HR 83

## 2023-08-02 DIAGNOSIS — I10 Essential (primary) hypertension: Secondary | ICD-10-CM

## 2023-08-02 MED ORDER — CARVEDILOL 25 MG PO TABS: 25.0000 mg | ORAL_TABLET | Freq: Two times a day (BID) | ORAL | 3 refills | Status: DC

## 2023-08-02 NOTE — Progress Notes (Signed)
Cardiology Office Note:  .   Date:  08/02/2023  ID:  Pamela Hatfield, DOB April 01, 1951, MRN [id number] PCP: Theodis Shove, DO  [facility] Providers Cardiologist:  Orbie Pyo, MD   History of Present Illness: .   Pamela Hatfield is a 72 y.o. female with a past medical history of HTN, type 2 DM, HLD, carcinoid tumor. Patient is followed by Dr. Lynnette Caffey and presents today for evaluation of BP.   Per chart review, patient previously wore a cardiac monitor in 04/2016 that showed NSR. Later established care with Dr. Lynnette Caffey in 01/2022 for evaluation of chest pain. Underwent coronary CTA in 01/2022 that showed a calcium score of 10 (41st percentile), minimal non-obstructive CAD (1-24%). Echocardiogram showed EF 60-65%, no regional wall motion abnormalities, grade I DD, normal RV function. Patient was los to cardiology follow up  Was seen in clinic on 10/28 for elevated BP. Her BP readings were elevated to the 160s-180s systolic at home. She had been started on clonidine 0.1 mg 4 times per day by her pulmonologist. Was also on metoprolol tartrate 50 mg BID, valsartan 160 mg BID. I transitioned her to carvedilol 6.25 mg BID and instructed her to decrease her clonidine to twice daily. She was referred to PharmD and Carvedilol was later increased to 25 mg BID.   Today, patient presents for a follow up appointment regarding her elevated blood pressure. Her main complaint is ongoing fatigue. The fatigue is not new or worse than usual, and the patient has been experiencing it for an extended period of time. The patient also reports shortness of breath, which is not new but comes and goes. The shortness of breath is particularly noticeable at night, worsening as the evening progresses, and is severe enough to cause discomfort when lying down. In addition to these symptoms, the patient has been dealing with back pain, which has limited her physical activity. The patient is scheduled to receive a  stimulator implant for pain management.   The patient is also under treatment for a carcinoid tumor, receiving monthly injections. The tumor is reported to be stable, not growing but not shrinking either. The patient associates the onset of her fatigue with the start of these treatments.  The patient's blood pressure has been well-controlled recently, with readings looking good at the last few checks. She reports feeling well aside from the fatigue. Occasionally has dizziness, but it is not associated with position changes. She has been having issues with dizziness since before we started adjusting her BP medications.   ROS: Per HPI   Studies Reviewed: .   Cardiac Studies & Procedures       ECHOCARDIOGRAM  ECHOCARDIOGRAM COMPLETE 02/01/2022  Narrative ECHOCARDIOGRAM REPORT    Patient Name:   Pamela Hatfield Date of Exam: 02/01/2022 Medical Rec #:  [id number]       Height:       65.0 in Accession #:    [id number]      Weight:       179.6 lb Date of Birth:  1951-02-22        BSA:          1.890 m Patient Age:    71 years        BP:           129/77 mmHg Patient Gender: F               HR:  62 bpm. [facility]am [facility]  Procedure: 2D Echo, Cardiac Doppler and Color Doppler  Indications:    Dyspnea  History:        Patient has no prior history of Echocardiogram examinations. Signs/Symptoms:Chest Pain and Dyspnea; Risk Factors:Hypertension, Diabetes, Dyslipidemia and Former Smoker.  Sonographer:    Mikki Harbor Referring Phys: [id number] Orbie Pyo  IMPRESSIONS   1. Left ventricular ejection fraction, by estimation, is 60 to 65%. The left ventricle has normal function. The left ventricle has no regional wall motion abnormalities. Left ventricular diastolic parameters are consistent with Grade I diastolic dysfunction (impaired relaxation). 2. Right ventricular systolic function is normal. The right ventricular size is normal. Tricuspid regurgitation signal is  inadequate for assessing PA pressure. 3. The mitral valve is normal in structure. No evidence of mitral valve regurgitation. No evidence of mitral stenosis. 4. The aortic valve was not well visualized. Aortic valve regurgitation is trivial. No aortic stenosis is present. 5. The inferior vena cava is normal in size with greater than 50% respiratory variability, suggesting right atrial pressure of 3 mmHg.  FINDINGS Left Ventricle: Left ventricular ejection fraction, by estimation, is 60 to 65%. The left ventricle has normal function. The left ventricle has no regional wall motion abnormalities. The left ventricular internal cavity size was normal in size. There is no left ventricular hypertrophy. Left ventricular diastolic parameters are consistent with Grade I diastolic dysfunction (impaired relaxation).  Right Ventricle: The right ventricular size is normal. No increase in right ventricular wall thickness. Right ventricular systolic function is normal. Tricuspid regurgitation signal is inadequate for assessing PA pressure.  Left Atrium: Left atrial size was normal in size.  Right Atrium: Right atrial size was normal in size.  Pericardium: Trivial pericardial effusion is present. Presence of epicardial fat layer.  Mitral Valve: The mitral valve is normal in structure. No evidence of mitral valve regurgitation. No evidence of mitral valve stenosis. MV peak gradient, 3.1 mmHg. The mean mitral valve gradient is 1.0 mmHg.  Tricuspid Valve: The tricuspid valve is normal in structure. Tricuspid valve regurgitation is trivial.  Aortic Valve: The aortic valve was not well visualized. Aortic valve regurgitation is trivial. No aortic stenosis is present. Aortic valve mean gradient measures 4.0 mmHg. Aortic valve peak gradient measures 7.3 mmHg. Aortic valve area, by VTI measures 2.45 cm.  Pulmonic Valve: The pulmonic valve was not well visualized. Pulmonic valve regurgitation is not  visualized.  Aorta: The aortic root and ascending aorta are structurally normal, with no evidence of dilitation.  Venous: The inferior vena cava is normal in size with greater than 50% respiratory variability, suggesting right atrial pressure of 3 mmHg.  IAS/Shunts: The interatrial septum was not well visualized.   LEFT VENTRICLE PLAX 2D LVIDd:         5.00 cm     Diastology LVIDs:         2.90 cm     LV e' medial:    5.00 cm/s LV PW:         0.70 cm     LV E/e' medial:  11.9 LV IVS:        0.70 cm     LV e' lateral:   6.85 cm/s LVOT diam:     1.90 cm     LV E/e' lateral: 8.7 LV SV:         75 LV SV Index:   40 LVOT Area:     2.84 cm  LV Volumes (MOD) LV  vol d, MOD A2C: 28.7 ml LV vol d, MOD A4C: 33.6 ml LV vol s, MOD A2C: 10.0 ml LV vol s, MOD A4C: 12.6 ml LV SV MOD A2C:     18.7 ml LV SV MOD A4C:     33.6 ml LV SV MOD BP:      19.7 ml  RIGHT VENTRICLE RV Basal diam:  3.10 cm RV Mid diam:    2.60 cm RV S prime:     11.50 cm/s TAPSE (M-mode): 3.0 cm  LEFT ATRIUM             Index        RIGHT ATRIUM           Index LA diam:        3.80 cm 2.01 cm/m   RA Area:     14.90 cm LA Vol (A2C):   31.5 ml 16.67 ml/m  RA Volume:   35.40 ml  18.73 ml/m LA Vol (A4C):   53.0 ml 28.05 ml/m LA Biplane Vol: 44.7 ml 23.65 ml/m AORTIC VALVE                    PULMONIC VALVE AV Area (Vmax):    2.07 cm     PV Vmax:       0.48 m/s AV Area (Vmean):   1.92 cm     PV Peak grad:  0.9 mmHg AV Area (VTI):     2.45 cm AV Vmax:           135.00 cm/s AV Vmean:          99.600 cm/s AV VTI:            0.307 m AV Peak Grad:      7.3 mmHg AV Mean Grad:      4.0 mmHg LVOT Vmax:         98.60 cm/s LVOT Vmean:        67.400 cm/s LVOT VTI:          0.265 m LVOT/AV VTI ratio: 0.86  AORTA Ao Root diam: 2.60 cm Ao Asc diam:  3.10 cm  MITRAL VALVE MV Area (PHT): 3.24 cm    SHUNTS MV Area VTI:   2.83 cm    Systemic VTI:  0.26 m MV Peak grad:  3.1 mmHg    Systemic Diam: 1.90 cm MV Mean  grad:  1.0 mmHg MV Vmax:       0.87 m/s MV Vmean:      49.8 cm/s MV Decel Time: 234 msec MV E velocity: 59.60 cm/s MV A velocity: 82.30 cm/s MV E/A ratio:  0.72  Epifanio Lesches MD Electronically signed by Epifanio Lesches MD Signature Date/Time: 02/01/2022/4:11:01 PM    Final    MONITORS  CARDIAC EVENT MONITOR 05/12/2016  Narrative 1. NSR   CT SCANS  CT CORONARY MORPH W/CTA COR W/SCORE 01/28/2022  Addendum 01/28/2022  4:29 PM ADDENDUM REPORT: 01/28/2022 16:26  CLINICAL DATA:  This over-read does not include interpretation of cardiac or coronary anatomy or pathology. The coronary CTA interpretation by the cardiologist is attached.  COMPARISON:  None available.  FINDINGS: No suspicious nodules, masses, or infiltrates are identified in the visualized portion of the lungs. No pleural fluid seen.  The visualized portions of the mediastinum and chest wall are unremarkable.  IMPRESSION: No significant non-cardiac abnormality identified.   Electronically Signed By: Danae Orleans M.D. On: 01/28/2022 16:26  Narrative CLINICAL DATA:  72 year old White Female  EXAM: Cardiac/Coronary  CTA  TECHNIQUE: The patient was scanned on a [employer].  FINDINGS: Scan was triggered in the descending thoracic aorta. Axial non-contrast 3 mm slices were carried out through the heart. The data set was analyzed on a dedicated work station and scored using the Agatson method. Gantry rotation speed was 250 msecs and collimation was .6 mm. 0.8 mg of sl NTG was given. The 3D data set was reconstructed in 5% intervals of the 67-82 % of the R-R cycle. Diastolic phases were analyzed on a dedicated work station using MPR, MIP and VRT modes. The patient received 100 cc of contrast.  Aorta:  Normal size.  Aortic atherosclerosis.  No dissection.  Main Pulmonary Artery: Dilation of the main pulmonary artery, moderate, 32 mm.  Aortic Valve:  Tri-leaflet.  No  calcifications.  Coronary Arteries:  Normal coronary origin.  Left dominance.  Coronary Calcium Score:  Left main: 0  Left anterior descending artery: 10  Left circumflex artery: 0  Right coronary artery: 0  Total: 10  Percentile: 41st for age, sex, and race matched control.  RCA is a large non-dominant artery.  There is no significant plaque.  Left main is a large artery that gives rise to LAD and LCX arteries. There is no significant plaque.  LAD is a large vessel that gives rise to one two diagonal branches. Minimal non obstructive calcified plaque in the proximal LAD.  LCX is a dominant artery that gives rise to one OM1 branch that bifurcates. There is no significant plaque.  Other findings:  Normal pulmonary vein drainage into the left atrium.  Normal left atrial appendage without a thrombus.  Small atrial diverticulum-normal variant anatomy.  Extra-cardiac findings: See attached radiology report for non-cardiac structures.  Cardiac motion slab artifact.  IMPRESSION: 1. Coronary calcium score of 10 this was 41st percentile for age, sex, and race matched control.  2. Normal coronary origin with left dominance.  3. Dilation of the main pulmonary artery, moderate, 32 mm. This can be associated with the presence of pulmonary hypertension; clinical correlation advised.  4. Aortic atherosclerosis.  5. CAD-RADS 1. Minimal non-obstructive CAD (1-24%). Consider non-atherosclerotic causes of chest pain. Consider preventive therapy and risk factor modification.  RECOMMENDATIONS:  Coronary artery calcium (CAC) score is a strong predictor of incident coronary heart disease (CHD) and provides predictive information beyond traditional risk factors. CAC scoring is reasonable to use in the decision to withhold, postpone, or initiate statin therapy in intermediate-risk or selected borderline-risk asymptomatic adults (age 21-75 years and LDL-C >=70 to <190  mg/dL) who do not have diabetes or established atherosclerotic cardiovascular disease (ASCVD).* In intermediate-risk (10-year ASCVD risk >=7.5% to <20%) adults or selected borderline-risk (10-year ASCVD risk >=5% to <7.5%) adults in whom a CAC score is measured for the purpose of making a treatment decision the following recommendations have been made:  If CAC = 0, it is reasonable to withhold statin therapy and reassess in 5 to 10 years, as long as higher risk conditions are absent (diabetes mellitus, family history of premature CHD in first degree relatives (males <55 years; females <65 years), cigarette smoking, LDL >=190 mg/dL or other independent risk factors).  If CAC is 1 to 99, it is reasonable to initiate statin therapy for patients >=55 years of age.  If CAC is >=100 or >=75th percentile, it is reasonable to initiate statin therapy at any age.  Cardiology referral should be considered for patients with CAC scores =400 or >=75th percentile.  *  2018 AHA/ACC/AACVPR/AAPA/ABC/ACPM/ADA/AGS/APhA/ASPC/NLA/PCNA Guideline on the Management of Blood Cholesterol: A Report of the [employer] of Cardiology/American Heart Association Task Force on Clinical Practice Guidelines. J Am Coll Cardiol. 2019;73(24):3168-3209.  Riley Lam, MD  Electronically Signed: By: Riley Lam M.D. On: 01/28/2022 15:14          Risk Assessment/Calculations:             Physical Exam:   VS:  There were no vitals taken for this visit.   Wt Readings from Last 3 Encounters:  07/11/23 168 lb 12.8 oz (76.6 kg)  06/20/23 165 lb (74.8 kg)  05/09/23 171 lb (77.6 kg)    GEN: Well nourished, well developed in no acute distress. Sitting comfortably in the chair  NECK: No JVD  CARDIAC:  RRR, no murmurs, rubs, gallops. Radial pulses 2+ bilaterally  RESPIRATORY:  Fine crackles in bilateral lung bases. Normal work of breathing on room air   ABDOMEN: Soft, non-tender,  non-distended EXTREMITIES:  No edema in BLE; No deformity   ASSESSMENT AND PLAN: .    HTN - Currently on carvedilol 25 mg BID, clonidine 0.1 mg BID, valsartan 160 mg BID  - BP was 131/80 on 10/28, 137/70 on 11/19  - BP today 128/78  - Goal is to get her off clonidine- instructed patient to stop her AM dose of clonidine and to continue 0.1 mg every PM  - Start hydrochlorothiazide 12.5 mg every AM  - Ordered BMP  - She has follow up with Pharm D on 12/20. Ordered repeat BMP to be collected at that time  - Discussed low sodium diets and increasing physical activity as tolerated  - Patient does mention intermittent dizziness. She has been having issues with dizziness since before we started to adjust her BP medications. Dizziness is not associated with positional changes and often occurs when she is sitting still. Unlikely that this is related to her BP medications as it has been ongoing since before we started meds    Minimal CAD on CT scan  HLD  - Coronary CTA from 01/2022 showed minimal nonobstructive CAD, coronary calcium score of 10 (41st percentile)  - Denies exertional chest pain - Patient has been intolerant of statins in the past. LDL 84 in 09/2021   Shortness of breath  - Patient reports ongoing shortness of breath. She has been seeing a pulmonologist, but has not had much improvement in symptoms  - Today, she reports orthopnea, occasional ankle swelling. Is overall euvolemic on exam  - Ordered echocardiogram  - Encourage her to increase physical activity as tolerated    Type 2 DM  - Followed by PCP    Carcinoid Tumor  - Followed by oncology at [facility]   Dispo: Has follow up with Pharm D on 12/20. Follow up with general cardiology in 2 months   Signed, Jonita Albee, PA-C

## 2023-08-02 NOTE — Assessment & Plan Note (Signed)
Assessment: BP is uncontrolled in office BP 137/70 mmHg heart rate 83 (goal<130/80) Home BP ~140-160/60-90 heart rate 70-80  Tolerates current BP medications well without any side effects Denies palpitation, chest pain, headaches,or swelling Ger SOB when she is active  Enjoys salty snacks and not able to walk longer than 5 min due to chronic back pain  Patient reports her resting heart rate was well controlled on metoprolol vs on carvedilol  Patient does not want to add any new medication but in agreement to up the carvedilol dose Plan:  Start taking carvedilol 25 mg twice daily  Continue taking clonidine 0.1 mg twice daily and valsartan 160 mg twice daily  Patient to keep record of BP readings with heart rate and report to [place] at the next visit Patient to bring in home BP monitor for validation  Patient to cut down on salt intake and start exercising ( 5-10 min walks 3 times per day)  In future plan is to take her off of the clonidine and if BP remains elevated can consider adding 1st line BP lowering agents like thiazide diuretics or DHP-CCB( amlodipine) Patient to see PharmD in 4 weeks for follow up  Follow up lab(s): none

## 2023-08-02 NOTE — Progress Notes (Unsigned)
Patient ID: KM WEATHERBEE                 DOB: [birth year]                      MRN: [id number]      HPI: Pamela Hatfield is a 72 y.o. female referred by Dr. Lynnette Caffey to HTN clinic. PMH is significant for HTN, type 2 DM, HLD, carcinoid tumor.  coronary CTA in 01/2022 that showed a calcium score of 10 (41st percentile), minimal non-obstructive CAD (1-24%). Echocardiogram showed EF 60-65%, no regional wall motion abnormalities, grade I DD, normal RV function. Patient saw Robet Leu, PA on 07/11/2023, home BP was elevated, she was on metoprolol tartrate 50 mg BID, valsartan 160 mg BID, and clonidine 0.1 mg up to 4 times daily as needed for elevated BP. Metoprolol was switched to carvedilol 6.125 mg twice daily. Patient showed interest in getting off of the clonidine in future.Instructed her to decrease to twice daily dosing with plans to taper off completely in the next few weeks   On 07/21/2023 Patient ran out of the clonidine so she went without it and her BP increased, clonidine refills sent with direction to take 0.1 mg twice daily for elevated BP and carvedilol dose was increased to 12.5 mg twice daily. Patient was referred to PharmD hypertension clinic   Today patient presented with her partner, forgot tobring home BP log and BP monitor. Recalls her home BP ~ 140-160/60-90 heart rate in high 80's. Reports her heart rate was in 60 and 70's when she was on metoprolol. She takes clonidine 12.5 mg twice daily from past 2 week that had help to lower her BP but some days it still remains elevated. Reports she has multiple pain condition and some days she is in lot more pain than the other. Couple weeks ago she forgot to take her morning BP medications, she uses pill box to track adherence. Suggest to set reminder on phone to improve adherence. Her current clonidine dose is 0.1 mg twice daily and valsartan 160 mg twice daily. She enjoys salty snacks and drinks lots of sugary tea/coffee during the day. She  does not add salt to her cooked food but add salt while cooking. Due to chronic pain not able to walk longer than 5 min. We discussed significance of lowering salt intake and how it would affect BP. Reports she gets headaches when her BP is elevated. Patient does not want to add any new medication, she will work on her salt intake.    Current HTN meds: carvedilol 12.5 mg twice daily, clonidine 0.1 mg twice daily   valsartan 160 mg twice daily  Previously tried: metoprolol,  BP goal: <130/80     Social History: alcohol: none  Smoking: none   Diet: salt intake - does not add salt to her food  Decaf tea and coffee- 1/2 pitcher per day  Exercise: none    Home BP readings:  ~140-160/60-90 heart rate 70-80   Wt Readings from Last 3 Encounters:  07/11/23 168 lb 12.8 oz (76.6 kg)  06/20/23 165 lb (74.8 kg)  05/09/23 171 lb (77.6 kg)   BP Readings from Last 3 Encounters:  08/02/23 137/70  07/11/23 131/80  06/20/23 (!) 145/84   Pulse Readings from Last 3 Encounters:  08/02/23 83  07/11/23 66  06/20/23 (!) 109    Renal function: CrCl cannot be calculated (Patient's most recent lab result is older than the  maximum 21 days allowed.).  Past Medical History:  Diagnosis Date   Accessory spleen    Anxiety    Arthritis    multiple areas of her body   Asthma    Cancer (HCC)    located on her aorta and small intestines- on Octreodtide 10mg  IM every 28 days for this   Chronic cluster headache    Depression    Diabetes mellitus    diet controlled   Dyspnea    today 11/09/2017, reports that her breathing is normal for her    Dysrhythmia    hx rapid heart beat   Fibromyalgia    GERD (gastroesophageal reflux disease)    History of hiatal hernia    Hyperlipidemia    Hypertension    Myalgia and myositis, unspecified    Polyneuropathy    Sleep apnea    no cpap used, pt does not like, use to use CPAP but due to insurance had to return the device    Weight loss 02/25/2011     Current Outpatient Medications on File Prior to Visit  Medication Sig Dispense Refill   cloNIDine (CATAPRES) 0.1 MG tablet Take 1 tablet (0.1 mg total) by mouth 2 (two) times daily.     Acetaminophen Extra Strength 500 MG CAPS Take 2 capsules by mouth every 8 (eight) hours.     buprenorphine (SUBUTEX) 2 MG SUBL SL tablet Place 2 mg under the tongue 4 (four) times daily. (Patient not taking: Reported on 07/11/2023)     cetirizine (ZYRTEC) 10 MG tablet Take 10 mg by mouth daily as needed for allergies.  (Patient not taking: Reported on 07/11/2023)     Charcoal Activated (ACTIVATED CHARCOAL PO) Take 520 mg by mouth every other day.     Dextromethorphan-guaiFENesin (MUCINEX DM MAXIMUM STRENGTH) 60-1200 MG TB12 Take 1 tablet by mouth daily.     DULoxetine (CYMBALTA) 60 MG capsule Take 60 mg by mouth daily.     estradiol (ESTRACE) 0.1 MG/GM vaginal cream Place 1 Applicatorful vaginally every other day.     famotidine (PEPCID) 20 MG tablet Take after supper daily 30 tablet 11   fluticasone (FLONASE) 50 MCG/ACT nasal spray Place 2 sprays into both nostrils daily.     linaclotide (LINZESS) 145 MCG CAPS capsule Take 145 mcg by mouth daily before breakfast.     montelukast (SINGULAIR) 10 MG tablet Take 10 mg by mouth at bedtime.      Multiple Vitamins-Minerals (HAIR/SKIN/NAILS) CAPS Take 2 capsules by mouth daily.     NON FORMULARY Take 0.5 tablets by mouth daily as needed (pain). CBD Gummies     octreotide (SANDOSTATIN LAR) 10 MG injection Inject 10 mg into the muscle every 28 (twenty-eight) days.     oxyCODONE (OXY IR/ROXICODONE) 5 MG immediate release tablet Take 5 mg by mouth every 4 (four) hours as needed.     pantoprazole (PROTONIX) 20 MG tablet Take 20 mg by mouth daily.     polyethylene glycol powder (GLYCOLAX/MIRALAX) 17 GM/SCOOP powder Take 17 g by mouth daily as needed for mild constipation or moderate constipation.     PROLIA 60 MG/ML SOSY injection Inject 60 mg into the skin every 6 (six)  months.     Sod Picosulfate-Mag Ox-Cit Acd (CLENPIQ) 10-3.5-12 MG-GM -GM/175ML SOLN Take 1 kit by mouth as directed. (Patient not taking: Reported on 07/11/2023) 350 mL 0   trolamine salicylate (BLUE-EMU HEMP) 10 % cream Apply 1 Application topically as needed for muscle pain.  valsartan (DIOVAN) 160 MG tablet Take 1 tablet (160 mg total) by mouth 2 (two) times daily. 60 tablet 11   No current facility-administered medications on file prior to visit.    Allergies  Allergen Reactions   Nickel Swelling   Sulfa Antibiotics Hives   Clarithromycin Rash   Codeine Itching and Other (See Comments)    Headaches   Lamotrigine Rash   Tape Itching    Blood pressure 137/70, pulse 83, SpO2 98%.   Assessment/Plan:  1. Hypertension -  Essential hypertension Assessment: BP is uncontrolled in office BP 137/70 mmHg heart rate 83 (goal<130/80) Home BP ~140-160/60-90 heart rate 70-80  Tolerates current BP medications well without any side effects Denies palpitation, chest pain, headaches,or swelling Ger SOB when she is active  Enjoys salty snacks and not able to walk longer than 5 min due to chronic back pain  Patient reports her resting heart rate was well controlled on metoprolol vs on carvedilol  Patient does not want to add any new medication but in agreement to up the carvedilol dose Plan:  Start taking carvedilol 25 mg twice daily  Continue taking clonidine 0.1 mg twice daily and valsartan 160 mg twice daily  Patient to keep record of BP readings with heart rate and report to [place] at the next visit Patient to bring in home BP monitor for validation  Patient to cut down on salt intake and start exercising ( 5-10 min walks 3 times per day)  In future plan is to take her off of the clonidine and if BP remains elevated can consider adding 1st line BP lowering agents like thiazide diuretics or DHP-CCB( amlodipine) Patient to see PharmD in 4 weeks for follow up  Follow up lab(s): none      Thank you  Carmela Hurt, Pharm.D [facility] A Division of [facility] [address] [place], [place] [address]  Phone: [phone]; Fax: [phone]

## 2023-08-02 NOTE — Patient Instructions (Signed)
Changes made by your pharmacist Carmela Hurt, PharmD at today's visit:    Instructions/Changes  (what do you need to do) Your Notes  (what you did and when you did it)  Increase carvedilol dose from 12.5 mg twice daily to 25 mg twice daily phone follow up in 2 weeks    Start exercising regularly    Cut down on salt and sugar intake     Bring all of your meds, your BP cuff and your record of home blood pressures to your next appointment.    HOW TO TAKE YOUR BLOOD PRESSURE AT HOME  Rest 5 minutes before taking your blood pressure.  Don't smoke or drink caffeinated beverages for at least 30 minutes before. Take your blood pressure before (not after) you eat. Sit comfortably with your back supported and both feet on the floor (don't cross your legs). Elevate your arm to heart level on a table or a desk. Use the proper sized cuff. It should fit smoothly and snugly around your bare upper arm. There should be enough room to slip a fingertip under the cuff. The bottom edge of the cuff should be 1 inch above the crease of the elbow. Ideally, take 3 measurements at one sitting and record the average.  Important lifestyle changes to control high blood pressure  Intervention  Effect on the BP  Lose extra pounds and watch your waistline Weight loss is one of the most effective lifestyle changes for controlling blood pressure. If you're overweight or obese, losing even a small amount of weight can help reduce blood pressure. Blood pressure might go down by about 1 millimeter of mercury (mm Hg) with each kilogram (about 2.2 pounds) of weight lost.  Exercise regularly As a general goal, aim for at least 30 minutes of moderate physical activity every day. Regular physical activity can lower high blood pressure by about 5 to 8 mm Hg.  Eat a healthy diet Eating a diet rich in whole grains, fruits, vegetables, and low-fat dairy products and low in saturated fat and cholesterol. A healthy diet can lower  high blood pressure by up to 11 mm Hg.  Reduce salt (sodium) in your diet Even a small reduction of sodium in the diet can improve heart health and reduce high blood pressure by about 5 to 6 mm Hg.  Limit alcohol One drink equals 12 ounces of beer, 5 ounces of wine, or 1.5 ounces of 80-proof liquor.  Limiting alcohol to less than one drink a day for women or two drinks a day for men can help lower blood pressure by about 4 mm Hg.   If you have any questions or concerns please use My Chart to send questions or call the office at [phone]

## 2023-08-08 ENCOUNTER — Ambulatory Visit: Payer: Medicare HMO | Attending: Cardiology | Admitting: Cardiology

## 2023-08-08 ENCOUNTER — Encounter: Payer: Self-pay | Admitting: Cardiology

## 2023-08-08 VITALS — BP 128/78 | HR 64 | Ht 64.0 in | Wt 173.4 lb

## 2023-08-08 DIAGNOSIS — Z79899 Other long term (current) drug therapy: Secondary | ICD-10-CM

## 2023-08-08 DIAGNOSIS — E119 Type 2 diabetes mellitus without complications: Secondary | ICD-10-CM

## 2023-08-08 DIAGNOSIS — I10 Essential (primary) hypertension: Secondary | ICD-10-CM

## 2023-08-08 DIAGNOSIS — E785 Hyperlipidemia, unspecified: Secondary | ICD-10-CM

## 2023-08-08 DIAGNOSIS — E1169 Type 2 diabetes mellitus with other specified complication: Secondary | ICD-10-CM

## 2023-08-08 DIAGNOSIS — I2510 Atherosclerotic heart disease of native coronary artery without angina pectoris: Secondary | ICD-10-CM

## 2023-08-08 DIAGNOSIS — R0602 Shortness of breath: Secondary | ICD-10-CM | POA: Diagnosis not present

## 2023-08-08 DIAGNOSIS — D3A00 Benign carcinoid tumor of unspecified site: Secondary | ICD-10-CM

## 2023-08-08 MED ORDER — HYDROCHLOROTHIAZIDE 12.5 MG PO CAPS: 12.5000 mg | ORAL_CAPSULE | Freq: Every day | ORAL | 1 refills | Status: DC

## 2023-08-08 NOTE — Patient Instructions (Signed)
Medication Instructions:  Decrease Clondine to 0.1 mg in the evening Start Hydrochlorothiazide 12.5 mg once a day *If you need a refill on your cardiac medications before your next appointment, please call your pharmacy*  Lab Work: Today we will draw BMP On the 20th when you go in for your appointment with pharmacy we will draw BMP If you have labs (blood work) drawn today and your tests are completely normal, you will receive your results only by: MyChart Message (if you have MyChart) OR A paper copy in the mail If you have any lab test that is abnormal or we need to change your treatment, we will call you to review the results.  Testing/Procedures: Your physician has requested that you have an echocardiogram. Echocardiography is a painless test that uses sound waves to create images of your heart. It provides your doctor with information about the size and shape of your heart and how well your heart's chambers and valves are working. This procedure takes approximately one hour. There are no restrictions for this procedure. Please do NOT wear cologne, perfume, aftershave, or lotions (deodorant is allowed). Please arrive 15 minutes prior to your appointment time.  Please note: We ask at that you not bring children with you during ultrasound (echo/ vascular) testing. Due to room size and safety concerns, children are not allowed in the ultrasound rooms during exams. Our front office staff cannot provide observation of children in our lobby area while testing is being conducted. An adult accompanying a patient to their appointment will only be allowed in the ultrasound room at the discretion of the ultrasound technician under special circumstances. We apologize for any inconvenience.  Follow-Up: At [facility], you and your health needs are our priority.  As part of our continuing mission to provide you with exceptional heart care, we have created designated Provider Care Teams.  These  Care Teams include your primary Cardiologist (physician) and Advanced Practice Providers (APPs -  Physician Assistants and Nurse Practitioners) who all work together to provide you with the care you need, when you need it.  We recommend signing up for the patient portal called "MyChart".  Sign up information is provided on this After Visit Summary.  MyChart is used to connect with patients for Virtual Visits (Telemedicine).  Patients are able to view lab/test results, encounter notes, upcoming appointments, etc.  Non-urgent messages can be sent to your provider as well.   To learn more about what you can do with MyChart, go to [email or web address].    Your next appointment:   2 month(s)  Provider:   Any APP

## 2023-08-09 ENCOUNTER — Telehealth: Payer: Self-pay

## 2023-08-09 LAB — BASIC METABOLIC PANEL
BUN/Creatinine Ratio: 19 (ref 12–28)
BUN: 14 mg/dL (ref 8–27)
CO2: 26 mmol/L (ref 20–29)
Calcium: 9.1 mg/dL (ref 8.7–10.3)
Chloride: 102 mmol/L (ref 96–106)
Creatinine, Ser: 0.75 mg/dL (ref 0.57–1.00)
Glucose: 104 mg/dL — ABNORMAL HIGH (ref 70–99)
Potassium: 5.1 mmol/L (ref 3.5–5.2)
Sodium: 139 mmol/L (ref 134–144)
eGFR: 84 mL/min/{1.73_m2} (ref >59)

## 2023-08-09 NOTE — Telephone Encounter (Signed)
-----   Message from Pamela Hatfield sent at 08/09/2023  8:09 AM EST ----- Please tell patient that her labwork showed normal kidney function, normal electrolytes. OK to start hydrochlorothiazide as discussed yesterday. Repeat BMP when seen by Pharm D on 12/20 (order already placed)   Thanks KJ

## 2023-08-09 NOTE — Telephone Encounter (Signed)
Called patient advised of below they verbalized understanding.

## 2023-09-02 ENCOUNTER — Ambulatory Visit: Payer: Medicare HMO

## 2023-09-05 ENCOUNTER — Ambulatory Visit (HOSPITAL_COMMUNITY): Payer: Medicare HMO

## 2023-09-19 ENCOUNTER — Ambulatory Visit: Payer: Medicare HMO | Attending: Nurse Practitioner | Admitting: Emergency Medicine

## 2023-09-19 ENCOUNTER — Encounter: Payer: Self-pay | Admitting: Nurse Practitioner

## 2023-09-19 VITALS — BP 124/72

## 2023-09-19 DIAGNOSIS — Z789 Other specified health status: Secondary | ICD-10-CM | POA: Diagnosis not present

## 2023-09-19 DIAGNOSIS — R0602 Shortness of breath: Secondary | ICD-10-CM

## 2023-09-19 DIAGNOSIS — I10 Essential (primary) hypertension: Secondary | ICD-10-CM

## 2023-09-19 DIAGNOSIS — Z79899 Other long term (current) drug therapy: Secondary | ICD-10-CM

## 2023-09-19 DIAGNOSIS — E785 Hyperlipidemia, unspecified: Secondary | ICD-10-CM

## 2023-09-19 DIAGNOSIS — E1169 Type 2 diabetes mellitus with other specified complication: Secondary | ICD-10-CM | POA: Diagnosis not present

## 2023-09-19 DIAGNOSIS — D3A00 Benign carcinoid tumor of unspecified site: Secondary | ICD-10-CM

## 2023-09-19 DIAGNOSIS — I700 Atherosclerosis of aorta: Secondary | ICD-10-CM

## 2023-09-19 NOTE — Patient Instructions (Addendum)
 Medication Instructions:  Your physician recommends that you continue on your current medications as directed. Please refer to the Current Medication list given to you today.  *If you need a refill on your cardiac medications before your next appointment, please call your pharmacy*   Lab Work: Fasting Lipid panel at your convenience.   Testing/Procedures: NONE ordered at this time of appointment     Follow-Up: At [facility], you and your health needs are our priority.  As part of our continuing mission to provide you with exceptional heart care, we have created designated Provider Care Teams.  These Care Teams include your primary Cardiologist (physician) and Advanced Practice Providers (APPs -  Physician Assistants and Nurse Practitioners) who all work together to provide you with the care you need, when you need it.  We recommend signing up for the patient portal called MyChart.  Sign up information is provided on this After Visit Summary.  MyChart is used to connect with patients for Virtual Visits (Telemedicine).  Patients are able to view lab/test results, encounter notes, upcoming appointments, etc.  Non-urgent messages can be sent to your provider as well.   To learn more about what you can do with MyChart, go to [email or web address].    Your next appointment:   6 month(s) Pharm-D Lipids Provider:   Arun K Thukkani, MD

## 2023-09-19 NOTE — Progress Notes (Signed)
 Cardiology Office Note:    Date:  09/19/2023  ID:  Pamela Hatfield, DOB 1951/01/17, MRN [id number] PCP: Judyth Isaiah Bottcher, DO  [facility] Providers Cardiologist:  Lurena MARLA Red, MD       Patient Profile:      Pamela Hatfield is a 73 year old female with visit pertinent history of HTN, T2DM, HLD, carcinoid tumor, CAD.  Patient previously wore a cardiac monitor in August 2017 that showed NSR.  She later established care with Dr. Red in May 2023 for evaluation of chest pain.  She underwent coronary CTA in May 2023 that showed a calcium  score of 10, 41st percentile, minimal nonobstructive CAD (1-24%).  Echocardiogram showed EF 60-65%, no RWMA, grade 1 DD, normal RV function.  Last seen in clinic on 08/08/2023 by Rollo, [place] regarding her elevated blood pressure.  Her main complaint during the visit was ongoing fatigue.  Her blood pressure in clinic was 128/78 however the goal was to discontinue her clonidine  as she was taking 0.1 mg in the a.m. and p.m.  She was instructed to stop her a.m. dose of clonidine  and continue her p.m. dose as well as start hydrochlorothiazide  12.5 mg every a.m.  She was continued on carvedilol  25 mg twice daily as well as valsartan  160 mg twice daily.  She was also noted to have shortness of breath that was particularly noticeable at night and is severe enough to cause discomfort while laying down.  An echocardiogram was ordered however this was canceled by patient.      History of Present Illness:  Pamela Hatfield is a 73 y.o. female who returns for 2 month follow up for blood pressure.  Today Pamela Hatfield is doing well overall.  She is without any cardiovascular complaints or concerns today.  She tells me that her activity level and ability to exercise is significantly limited due to chronic back pain.  She does note that she is working with her physician on receiving a nerve stimulator.  She notes that since she started the hydrochlorothiazide  during her  last visit with Rollo she resumed her clonidine  0.1 mg every p.m. for 2 weeks and then self discontinued.  She notes since this change her blood pressure has been overall well-controlled with an average daily BP of 120/80.  She notes that her chronic fatigue has overall not changed since coming off clonidine , and she still experiences ongoing daily fatigue.  She notes that she still experiences some intermittent dizziness.  The dizziness is however associated with position changes such as bending forward or standing up.  She also notes that her dizziness is chronic and has been ongoing for several years.  She does however note that her shortness of breath has improved since the last time we have seen her and she is able to lay flat at rest without significant SOB.  She denies chest pain, lower extremity edema, palpitations, melena, hematuria, hemoptysis, diaphoresis, weakness, presyncope, syncope, orthopnea, and PND.       Review of Systems  Constitutional: Positive for malaise/fatigue. Negative for weight gain and weight loss.  Cardiovascular:  Negative for chest pain, claudication, dyspnea on exertion, irregular heartbeat, leg swelling, near-syncope, orthopnea, palpitations, paroxysmal nocturnal dyspnea and syncope.  Respiratory:  Positive for shortness of breath. Negative for cough and hemoptysis.   Gastrointestinal:  Negative for abdominal pain, hematochezia and melena.  Genitourinary:  Negative for hematuria.  Neurological:  Positive for dizziness. Negative for headaches and light-headedness.     See HPI  Studies Reviewed:       Echocardiogram 02/01/2022 1. Left ventricular ejection fraction, by estimation, is 60 to 65%. The  left ventricle has normal function. The left ventricle has no regional  wall motion abnormalities. Left ventricular diastolic parameters are  consistent with Grade I diastolic  dysfunction (impaired relaxation).   2. Right ventricular systolic function is normal.  The right ventricular  size is normal. Tricuspid regurgitation signal is inadequate for assessing  PA pressure.   3. The mitral valve is normal in structure. No evidence of mitral valve  regurgitation. No evidence of mitral stenosis.   4. The aortic valve was not well visualized. Aortic valve regurgitation  is trivial. No aortic stenosis is present.   5. The inferior vena cava is normal in size with greater than 50%  respiratory variability, suggesting right atrial pressure of 3 mmHg.   Coronary CTA 01/28/2022 IMPRESSION: 1. Coronary calcium  score of 10 this was 41st percentile for age, sex, and race matched control.   2. Normal coronary origin with left dominance.   3. Dilation of the main pulmonary artery, moderate, 32 mm. This can be associated with the presence of pulmonary hypertension; clinical correlation advised.   4. Aortic atherosclerosis.   5. CAD-RADS 1. Minimal non-obstructive CAD (1-24%). Consider non-atherosclerotic causes of chest pain. Consider preventive therapy and risk factor modification. Risk Assessment/Calculations:             Physical Exam:   VS:  BP 124/72 (Cuff Size: Normal)    Wt Readings from Last 3 Encounters:  08/08/23 173 lb 6.4 oz (78.7 kg)  07/11/23 168 lb 12.8 oz (76.6 kg)  06/20/23 165 lb (74.8 kg)    Constitutional:      Appearance: Normal and healthy appearance.  HENT:     Head: Normocephalic.  Neck:     Vascular: JVD normal.  Pulmonary:     Effort: Pulmonary effort is normal.     Breath sounds: Normal breath sounds.  Chest:     Chest wall: Not tender to palpatation.  Cardiovascular:     PMI at left midclavicular line. Normal rate. Regular rhythm. Normal S1. Normal S2.      Murmurs: There is no murmur.     No gallop.  No click. No rub.  Pulses:    Intact distal pulses.  Edema:    Peripheral edema absent.  Musculoskeletal: Normal range of motion.     Cervical back: Normal range of motion and neck supple. Skin:    General:  Skin is warm and dry.  Neurological:     General: No focal deficit present.     Mental Status: Alert, oriented to person, place, and time and oriented to person, place and time.  Psychiatric:        Attention and Perception: Attention and perception normal.        Mood and Affect: Mood normal.        Behavior: Behavior is cooperative.        Thought Content: Thought content normal.        Assessment and Plan:  Hypertension -BP today under excellent control at 124/72. She self-discontinued clonidine  0.1mg  approx x2 weeks after starting hydrochlorothiazide . She notes BP average at home is 120s/80s. Notes that after d/c of clonidine  her fatigue has not improved, echocardiogram pending as below. Plan to continue Carvedilol  25mg  twice daily, Hydrochlorothiazide  12.5mg  daily, and Valsartan  160mg  daily  -Continue to monitor at home. Encouraged low salt, no sugar diet.   Coronary  artery disease / Hyperlipidemia / Aortic atherosclerosis  -Coronary CTA from 01/2022 showed minimal nonobstructive CAD, coronary calcium  score of 10 (41st percentile). LDL 84 in 09/2021. She is stable with no anginal symptoms, no indication for ischemic evaluation at this time. She is intolerant to statins due to severe myalgias. Her most recent LDL from 2 years ago is above her goal of <70. Plan to refer to PharmD lipid clinic for consideration of PSCK9. Repeat fasting lipid panel ordered to be drawn prior to appointment.   T2DM -A1c 6.9 in 2023. Managed by PCP.   Carcinoid tumor -Managed by oncology   Shortness of breath -Euvolemic and well-compensated. She has ongoing SOB that has improved over the past several weeks. She has pending Echocardiogram I have encouraged her to schedule. Most recent Echo  May 2023 with LVEF 60-65%.               Dispo:  Return in about 6 months (around 03/18/2024).  Signed, Lum LITTIE Louis, NP

## 2023-09-20 ENCOUNTER — Telehealth: Payer: Self-pay

## 2023-09-20 NOTE — Telephone Encounter (Signed)
 Patient assistance form for pt's Linzess is in your box to be signed. Pt is also requesting it be increased to 290 mcg due to 145 mcg due to it no longer being effective.

## 2023-09-20 NOTE — Telephone Encounter (Signed)
 Patient will need updated ov as well. Last seen over one year ago.

## 2023-09-21 NOTE — Telephone Encounter (Signed)
 Please arrange a follow up with Verlon Au.

## 2023-10-04 ENCOUNTER — Ambulatory Visit: Payer: Medicare HMO | Admitting: Gastroenterology

## 2023-10-18 LAB — LIPID PANEL
Chol/HDL Ratio: 3.7 {ratio} (ref 0.0–4.4)
Cholesterol, Total: 167 mg/dL (ref 100–199)
HDL: 45 mg/dL (ref >39)
LDL Chol Calc (NIH): 96 mg/dL (ref 0–99)
Triglycerides: 148 mg/dL (ref 0–149)
VLDL Cholesterol Cal: 26 mg/dL (ref 5–40)

## 2023-10-19 ENCOUNTER — Encounter: Payer: Self-pay | Admitting: Pharmacist Clinician (PhC)/ Clinical Pharmacy Specialist

## 2023-10-19 ENCOUNTER — Telehealth: Payer: Self-pay

## 2023-10-19 ENCOUNTER — Ambulatory Visit
Payer: Medicare (Managed Care) | Attending: Cardiology | Admitting: Pharmacist Clinician (PhC)/ Clinical Pharmacy Specialist

## 2023-10-19 DIAGNOSIS — E782 Mixed hyperlipidemia: Secondary | ICD-10-CM

## 2023-10-19 NOTE — Assessment & Plan Note (Signed)
 Assessment: Patient with ASCVD not at LDL goal of < 55 Most recent LDL 96 on 10/17/23 Not able to tolerate statins secondary to myalgias - atorvastatin , rosuvastatin Reviewed options for lowering LDL cholesterol, including ezetimibe, PCSK-9 inhibitors, bempedoic acid and inclisiran.  Discussed mechanisms of action, dosing, side effects, potential decreases in LDL cholesterol and costs.  Also reviewed potential options for patient assistance.  Plan: Patient agreeable to starting Repatha  140 mg q14d Repeat labs after:  3 months Lipid Liver function Patient was given information on [employer] - will sign patient up when PA approved Marital status Income < $72,000 (single) or < $102,000 (married)

## 2023-10-19 NOTE — Telephone Encounter (Signed)
 Lmom to discuss lab results. Waiting on a return call.

## 2023-10-19 NOTE — Progress Notes (Signed)
 Office Visit    Patient Name: Pamela Hatfield Date of Encounter: 10/19/2023  Primary Care Provider:  Judyth Isaiah Bottcher, DO Primary Cardiologist:  Lurena MARLA Red, MD  Chief Complaint    Hyperlipidemia   Significant Past Medical History   HTN   CAD 1/23 CAC = 10 (41st percentile)  DM2 7/23 A1c 6.5 (new dx)           Allergies  Allergen Reactions   Nickel Swelling   Sulfa Antibiotics Hives   Clarithromycin Rash   Codeine Itching and Other (See Comments)    Headaches   Lamotrigine  Rash   Tape Itching    History of Present Illness    Pamela Hatfield is a 73 y.o. female patient of Dr Red, in the office today to discuss options for cholesterol management.  Insurance Carrier: had confusion in switching plans, currently on [place], but will be switching any day, once paperwork is corrected.    [facility]  [facility], no mychart  LDL Cholesterol goal:  LDL < 55  Current Medications:  none  Previously tried:  atorvastatin , pravastatin - myalgias  Family Hx:   mother brothers with CABG (2), mgm had MI; didn't know father; brother (half - mom, had vascular disease), daughter now 62, cancer, DM, weight  Social Hx: Tobacco: no - quit at 26 Alcohol: no   Diet:  mix of home and out, eating out is usually fast foods, french fries, [employer]    Exercise: none, has back problems that limit - has stimulator   Accessory Clinical Findings   Lab Results  Component Value Date   CHOL 167 10/17/2023   HDL 45 10/17/2023   LDLCALC 96 10/17/2023   TRIG 148 10/17/2023   CHOLHDL 3.7 10/17/2023    No results found for: LIPOA  Lab Results  Component Value Date   ALT 20 01/29/2020   AST 24 01/29/2020   GGT 32 06/08/2022   ALKPHOS 157 (H) 01/29/2020   BILITOT 0.2 (L) 01/29/2020   Lab Results  Component Value Date   CREATININE 0.75 08/08/2023   BUN 14 08/08/2023   NA 139 08/08/2023   K 5.1 08/08/2023   CL 102 08/08/2023   CO2 26 08/08/2023   Lab  Results  Component Value Date   HGBA1C 6.5 (H) 03/31/2022    Home Medications    Current Outpatient Medications  Medication Sig Dispense Refill   Acetaminophen  Extra Strength 500 MG CAPS Take 2 capsules by mouth every 8 (eight) hours.     BELBUCA  150 MCG FILM Take 150 mcg by mouth every 12 (twelve) hours.     buprenorphine  (SUBUTEX ) 2 MG SUBL SL tablet Place 2 mg under the tongue 4 (four) times daily. (Patient not taking: Reported on 09/19/2023)     carvedilol  (COREG ) 25 MG tablet Take 1 tablet (25 mg total) by mouth 2 (two) times daily. 180 tablet 3   cetirizine (ZYRTEC) 10 MG tablet Take 10 mg by mouth daily as needed for allergies.     Charcoal Activated (ACTIVATED CHARCOAL PO) Take 520 mg by mouth every other day.     cloNIDine  (CATAPRES ) 0.1 MG tablet Take 0.1 mg by mouth daily. (Patient not taking: Reported on 09/19/2023)     Dextromethorphan-guaiFENesin  (MUCINEX  DM MAXIMUM STRENGTH) 60-1200 MG TB12 Take 1 tablet by mouth daily.     DULoxetine  (CYMBALTA ) 60 MG capsule Take 60 mg by mouth daily.     estradiol (ESTRACE) 0.1 MG/GM vaginal cream Place 1 Applicatorful  vaginally every other day.     famotidine  (PEPCID ) 20 MG tablet Take after supper daily 30 tablet 11   fluticasone  (FLONASE ) 50 MCG/ACT nasal spray Place 2 sprays into both nostrils daily.     hydrochlorothiazide  (MICROZIDE ) 12.5 MG capsule Take 1 capsule (12.5 mg total) by mouth daily. 90 capsule 1   linaclotide  (LINZESS ) 145 MCG CAPS capsule Take 145 mcg by mouth daily before breakfast.     montelukast  (SINGULAIR ) 10 MG tablet Take 10 mg by mouth at bedtime.      Multiple Vitamins-Minerals (HAIR/SKIN/NAILS) CAPS Take 2 capsules by mouth daily.     NON FORMULARY Take 0.5 tablets by mouth daily as needed (pain). CBD Gummies     octreotide  (SANDOSTATIN  LAR) 10 MG injection Inject 10 mg into the muscle every 28 (twenty-eight) days.     oxyCODONE  (OXY IR/ROXICODONE ) 5 MG immediate release tablet Take 5 mg by mouth every 4 (four)  hours as needed.     pantoprazole  (PROTONIX ) 20 MG tablet Take 20 mg by mouth daily.     polyethylene glycol powder (GLYCOLAX /MIRALAX ) 17 GM/SCOOP powder Take 17 g by mouth daily as needed for mild constipation or moderate constipation.     PROLIA 60 MG/ML SOSY injection Inject 60 mg into the skin every 6 (six) months.     Sod Picosulfate-Mag Ox-Cit Acd (CLENPIQ ) 10-3.5-12 MG-GM -GM/175ML SOLN Take 1 kit by mouth as directed. 350 mL 0   traMADol  (ULTRAM ) 50 MG tablet Take 50 mg by mouth 2 (two) times daily as needed.     trolamine salicylate (BLUE-EMU HEMP) 10 % cream Apply 1 Application topically as needed for muscle pain.     valsartan  (DIOVAN ) 160 MG tablet Take 1 tablet (160 mg total) by mouth 2 (two) times daily. 60 tablet 11   No current facility-administered medications for this visit.     Assessment & Plan    Mixed hyperlipidemia Assessment: Patient with ASCVD not at LDL goal of < 55 Most recent LDL 96 on 10/17/23 Not able to tolerate statins secondary to myalgias - atorvastatin , rosuvastatin Reviewed options for lowering LDL cholesterol, including ezetimibe, PCSK-9 inhibitors, bempedoic acid and inclisiran.  Discussed mechanisms of action, dosing, side effects, potential decreases in LDL cholesterol and costs.  Also reviewed potential options for patient assistance.  Plan: Patient agreeable to starting Repatha  140 mg q14d Repeat labs after:  3 months Lipid Liver function Patient was given information on [employer] - will sign patient up when PA approved Marital status Income < $72,000 (single) or < $102,000 (married)   Allean Mink, PharmD CPP [facility] [address]  [place], [place] [address] [phone]  10/19/2023, 1:20 PM

## 2023-10-19 NOTE — Patient Instructions (Signed)
 Your Results:             Your most recent labs Goal  Total Cholesterol 167 < 200  Triglycerides 148 < 150  HDL (happy/good cholesterol) 45 > 40  LDL (lousy/bad cholesterol 96 < 55   Medication changes:  We will start the process to get Repatha  covered by your insurance.  Once the prior authorization is complete, I will call to let you know and confirm pharmacy information.   You will take one injection every 14 days  Lab orders:  We want to repeat labs after 2-3 months.  We will send you a lab order to remind you once we get closer to that time.    Patient Assistance:    We will sign you up for a [employer] Grant once your medication is approved by [employer].  I will call you with the ID number, then you will take this information to the pharmacy.  They will bill it after your insurance, bringing your copay to $0.  The grant will pay the first $2,500 in a one year period.    ID   BIN [id number]  PCN [id number]  GRP [id number]    Thank you for choosing [facility]

## 2023-10-20 ENCOUNTER — Telehealth: Payer: Self-pay | Admitting: Pharmacist Clinician (PhC)/ Clinical Pharmacy Specialist

## 2023-10-20 NOTE — Telephone Encounter (Signed)
 Pt states she saw the pharmacist yesterday and they are trying to get her some medication. She would like to speak about her insurance change. Please advise.

## 2023-10-25 ENCOUNTER — Telehealth: Payer: Self-pay

## 2023-10-25 ENCOUNTER — Other Ambulatory Visit (HOSPITAL_COMMUNITY): Payer: Self-pay

## 2023-10-25 ENCOUNTER — Telehealth: Payer: Self-pay | Admitting: Pharmacy Technician

## 2023-10-25 NOTE — Telephone Encounter (Signed)
   PA request has been Submitted. New Encounter created for follow up. For additional info see Pharmacy Prior Auth telephone encounter from 10/25/23. But giving you a heads up it asked about zetia and I didn't see she had been on zetia

## 2023-10-25 NOTE — Telephone Encounter (Signed)
Pharmacy Patient Advocate Encounter   Received notification from Pt Calls Messages that prior authorization for Repatha SureClick 140MG /ML auto-injectors is required/requested.   Insurance verification completed.   The patient is insured through [employer] .   Per test claim: PA required; PA submitted to above mentioned insurance via CoverMyMeds Key/confirmation #/EOC [id number] Status is pending

## 2023-10-25 NOTE — Telephone Encounter (Signed)
Spoke with pt. Pt was notified of lab results and will f/u with Pharm-D as directed.

## 2023-10-25 NOTE — Telephone Encounter (Signed)
New insurance is with [facility] Preferred Medicare   ID # [id number]  BIN     [id number] PCN   [employer] GRP   [id number]   Please do PA for Repatha

## 2023-10-26 ENCOUNTER — Other Ambulatory Visit (HOSPITAL_COMMUNITY): Payer: Self-pay

## 2023-10-26 NOTE — Telephone Encounter (Signed)
Pharmacy Patient Advocate Encounter  Received notification from [employer] that Prior Authorization for Repatha SureClick 140MG /ML auto-injectors has been APPROVED from 10/15/23 to 10/24/24. Ran test claim, Copay is $47.00. This test claim was processed through [facility]- copay amounts may vary at other pharmacies due to pharmacy/plan contracts, or as the patient moves through the different stages of their insurance plan.   PA #/Case ID/Reference #: [id number]

## 2023-10-28 MED ORDER — REPATHA SURECLICK 140 MG/ML ~~LOC~~ SOAJ: 140.0000 mg | SUBCUTANEOUS | 3 refills | Status: DC

## 2023-10-28 NOTE — Telephone Encounter (Signed)
[employer] Kennedy Bucker approved for [employer] ID     [id number] BIN            [id number] PCN          [id number] GRP          [id number]  Expires 09/26/2024

## 2023-10-28 NOTE — Addendum Note (Signed)
Addended by: Rosalee Kaufman on: 10/28/2023 04:20 PM   Modules accepted: Orders

## 2024-01-04 ENCOUNTER — Telehealth: Payer: Self-pay | Admitting: Internal Medicine

## 2024-01-04 NOTE — Telephone Encounter (Signed)
 Patient stated she had a visit with her cancer doctor and she had a test which showed she had an enlarged pulmonary trunk.  Patient wants to get orders for an echocardiogram.

## 2024-01-04 NOTE — Telephone Encounter (Signed)
 Spoke with patient and she would like to get her echo scheduled. States it was ordered in November and she did received a call to schedule.

## 2024-01-26 ENCOUNTER — Other Ambulatory Visit: Payer: Self-pay

## 2024-01-26 MED ORDER — HYDROCHLOROTHIAZIDE 12.5 MG PO CAPS: 12.5000 mg | ORAL_CAPSULE | Freq: Every day | ORAL | 2 refills | Status: AC

## 2024-01-31 ENCOUNTER — Telehealth: Payer: Self-pay | Admitting: Pharmacist Clinician (PhC)/ Clinical Pharmacy Specialist

## 2024-01-31 DIAGNOSIS — E782 Mixed hyperlipidemia: Secondary | ICD-10-CM

## 2024-01-31 NOTE — Telephone Encounter (Signed)
 Lab order mailed to patient.

## 2024-02-08 ENCOUNTER — Ambulatory Visit (HOSPITAL_COMMUNITY)
Admission: RE | Admit: 2024-02-08 | Discharge: 2024-02-08 | Disposition: A | Discharge status: Home / Self Care | Payer: Medicare (Managed Care) | Source: Ambulatory Visit | Attending: Cardiovascular Disease | Admitting: Cardiovascular Disease

## 2024-02-08 ENCOUNTER — Ambulatory Visit: Payer: Self-pay | Admitting: Physician Assistant

## 2024-02-08 DIAGNOSIS — R0602 Shortness of breath: Secondary | ICD-10-CM | POA: Insufficient documentation

## 2024-02-08 LAB — ECHOCARDIOGRAM COMPLETE
Area-P 1/2: 4.36 cm2
S' Lateral: 2.98 cm

## 2024-02-14 LAB — LIPID PANEL
Chol/HDL Ratio: 2 ratio (ref 0.0–4.4)
Cholesterol, Total: 84 mg/dL — ABNORMAL LOW (ref 100–199)
HDL: 41 mg/dL (ref >39)
LDL Chol Calc (NIH): 22 mg/dL (ref 0–99)
Triglycerides: 119 mg/dL (ref 0–149)
VLDL Cholesterol Cal: 21 mg/dL (ref 5–40)

## 2024-02-17 ENCOUNTER — Ambulatory Visit: Payer: Self-pay | Admitting: Pharmacist Clinician (PhC)/ Clinical Pharmacy Specialist

## 2024-03-18 ENCOUNTER — Other Ambulatory Visit: Payer: Self-pay | Admitting: Internal Medicine

## 2024-05-11 ENCOUNTER — Other Ambulatory Visit: Payer: Self-pay | Admitting: Internal Medicine

## 2024-07-20 ENCOUNTER — Other Ambulatory Visit: Payer: Self-pay | Admitting: Internal Medicine

## 2024-07-24 ENCOUNTER — Other Ambulatory Visit: Payer: Self-pay | Admitting: Internal Medicine

## 2024-09-28 ENCOUNTER — Other Ambulatory Visit: Payer: Self-pay | Admitting: Internal Medicine

## 2024-10-16 ENCOUNTER — Telehealth: Payer: Self-pay | Admitting: Internal Medicine

## 2024-10-16 MED ORDER — REPATHA SURECLICK 140 MG/ML ~~LOC~~ SOAJ: 140.0000 mg | SUBCUTANEOUS | 0 refills | Status: AC

## 2024-10-16 NOTE — Telephone Encounter (Signed)
" °*  STAT* If patient is at the pharmacy, call can be transferred to refill team.   1. Which medications need to be refilled? (please list name of each medication and dose if known)   REPATHA  SURECLICK 140 MG/ML SOAJ     2. Would you like to learn more about the convenience, safety, & potential cost savings by using the [facility]?    3. Are you open to using the [facility] (Type [facility].    4. Which pharmacy/location (including street and city if local pharmacy) is medication to be sent to?[facility] - [place], [place] - [address]    5. Do they need a 30 day or 90 day supply? 90    Patient is requesting for [facility], previous prescription was sent to [facility].  "

## 2024-10-16 NOTE — Telephone Encounter (Signed)
 Called and scheduled patient an appt for 10/26/24 with Glendia Ferrier.

## 2024-10-17 ENCOUNTER — Telehealth: Payer: Self-pay | Admitting: Internal Medicine

## 2024-10-17 NOTE — Telephone Encounter (Signed)
 Pt c/o medication issue:  1. Name of Medication: Evolocumab  (REPATHA  SURECLICK) 140 MG/ML SOAJ   2. How are you currently taking this medication (dosage and times per day)?    3. Are you having a reaction (difficulty breathing--STAT)? no  4. What is your medication issue? Patient states there is suppose to be no cost for medication but its costing her $400. Please advise

## 2024-10-18 ENCOUNTER — Telehealth: Payer: Self-pay

## 2024-10-18 NOTE — Telephone Encounter (Signed)
 Pamela Hatfield, pt hasn't seen mychart message and has returned your call. I transferred the call to the front however she more than likely will not see your mychart message since she hasn't been active since last June.

## 2024-10-18 NOTE — Telephone Encounter (Signed)
 Pt called stating that she is about to run out of her linzess  145 mcg. Pt was last seen on 06/01/2022. Pt has an appt for refills scheduled on 11/26/2024. Pt is wanting to know if enough refills can be sent in for her to make it to her appt. Pt uses [facility] in [place].

## 2024-10-19 ENCOUNTER — Telehealth: Payer: Self-pay | Admitting: *Deleted

## 2024-10-19 ENCOUNTER — Other Ambulatory Visit (HOSPITAL_COMMUNITY): Payer: Self-pay

## 2024-10-19 ENCOUNTER — Telehealth: Payer: Self-pay | Admitting: Pharmacy Technician

## 2024-10-19 MED ORDER — LINACLOTIDE 145 MCG PO CAPS: 145.0000 ug | ORAL_CAPSULE | Freq: Every day | ORAL | 1 refills | Status: AC

## 2024-10-19 NOTE — Telephone Encounter (Signed)
#  30 and 1 refill sent

## 2024-10-19 NOTE — Telephone Encounter (Signed)
"  Lmom notifying pt  "

## 2024-10-19 NOTE — Addendum Note (Signed)
 Addended by: EZZARD SONNY RAMAN on: 10/19/2024 06:33 AM   Modules accepted: Orders

## 2024-10-19 NOTE — Telephone Encounter (Signed)
 Called patient to ask if she could move her app to 10/30/24 at 1:55 pm from 10/26/24 at 11:20 am    Patient agreed . Schedule cahnged

## 2024-10-19 NOTE — Telephone Encounter (Signed)
 Patient Advocate Encounter   The patient was approved for a [employer] grant that will help cover the cost of Repatha  Total amount awarded, 2500.00.  Effective: 09/27/24 - 09/26/25   APW:[id number] ERW:[id number] Hmnle:[id number] PI:[id number]  [employer] ID: [id number]   Pharmacy provided with approval and processing information. Patient informed via mychart

## 2024-10-26 ENCOUNTER — Ambulatory Visit: Payer: Medicare (Managed Care) | Admitting: Physician Assistant

## 2024-10-30 ENCOUNTER — Ambulatory Visit: Payer: Medicare (Managed Care) | Admitting: Physician Assistant

## 2024-11-26 ENCOUNTER — Ambulatory Visit: Payer: Medicare (Managed Care) | Admitting: Gastroenterology
